# Patient Record
Sex: Male | Born: 1935
Health system: Southern US, Community
[De-identification: ages and names within clinical notes are randomized; demographics above are authoritative.]

## PROBLEM LIST (undated history)

## (undated) DIAGNOSIS — E7800 Pure hypercholesterolemia, unspecified: Secondary | ICD-10-CM

## (undated) DIAGNOSIS — I213 ST elevation (STEMI) myocardial infarction of unspecified site: Secondary | ICD-10-CM

## (undated) DIAGNOSIS — J449 Chronic obstructive pulmonary disease, unspecified: Secondary | ICD-10-CM

## (undated) DIAGNOSIS — I2510 Atherosclerotic heart disease of native coronary artery without angina pectoris: Secondary | ICD-10-CM

## (undated) DIAGNOSIS — T4145XA Adverse effect of unspecified anesthetic, initial encounter: Secondary | ICD-10-CM

## (undated) DIAGNOSIS — M1990 Unspecified osteoarthritis, unspecified site: Secondary | ICD-10-CM

## (undated) DIAGNOSIS — E039 Hypothyroidism, unspecified: Secondary | ICD-10-CM

## (undated) DIAGNOSIS — N138 Other obstructive and reflux uropathy: Secondary | ICD-10-CM

## (undated) DIAGNOSIS — Z9889 Other specified postprocedural states: Secondary | ICD-10-CM

## (undated) DIAGNOSIS — G4730 Sleep apnea, unspecified: Secondary | ICD-10-CM

## (undated) DIAGNOSIS — I10 Essential (primary) hypertension: Secondary | ICD-10-CM

## (undated) DIAGNOSIS — J309 Allergic rhinitis, unspecified: Secondary | ICD-10-CM

## (undated) DIAGNOSIS — E119 Type 2 diabetes mellitus without complications: Secondary | ICD-10-CM

## (undated) DIAGNOSIS — Z955 Presence of coronary angioplasty implant and graft: Secondary | ICD-10-CM

## (undated) DIAGNOSIS — Z87442 Personal history of urinary calculi: Secondary | ICD-10-CM

## (undated) DIAGNOSIS — N401 Enlarged prostate with lower urinary tract symptoms: Secondary | ICD-10-CM

## (undated) DIAGNOSIS — N200 Calculus of kidney: Secondary | ICD-10-CM

## (undated) DIAGNOSIS — N135 Crossing vessel and stricture of ureter without hydronephrosis: Secondary | ICD-10-CM

## (undated) DIAGNOSIS — S0291XA Unspecified fracture of skull, initial encounter for closed fracture: Secondary | ICD-10-CM

## (undated) DIAGNOSIS — I495 Sick sinus syndrome: Secondary | ICD-10-CM

## (undated) DIAGNOSIS — Z950 Presence of cardiac pacemaker: Secondary | ICD-10-CM

## (undated) DIAGNOSIS — I441 Atrioventricular block, second degree: Secondary | ICD-10-CM

## (undated) DIAGNOSIS — N3281 Overactive bladder: Secondary | ICD-10-CM

## (undated) DIAGNOSIS — H9193 Unspecified hearing loss, bilateral: Secondary | ICD-10-CM

## (undated) DIAGNOSIS — N183 Chronic kidney disease, stage 3 unspecified: Secondary | ICD-10-CM

## (undated) HISTORY — PX: CIRCUMCISION: SHX1350

## (undated) HISTORY — DX: Chronic obstructive pulmonary disease, unspecified: J44.9

## (undated) HISTORY — PX: LUMBAR DISC SURGERY: SHX700

## (undated) HISTORY — DX: Atrioventricular block, second degree: I44.1

## (undated) HISTORY — DX: Allergic rhinitis, unspecified: J30.9

## (undated) HISTORY — PX: VASECTOMY: SHX75

## (undated) HISTORY — PX: BACK SURGERY: SHX140

## (undated) HISTORY — PX: APPENDECTOMY: SHX54

## (undated) HISTORY — DX: Sick sinus syndrome: I49.5

## (undated) HISTORY — DX: Chronic kidney disease, stage 3 unspecified: N18.30

## (undated) HISTORY — DX: Hypothyroidism, unspecified: E03.9

## (undated) HISTORY — PX: COLONOSCOPY: SHX174

## (undated) HISTORY — DX: Pure hypercholesterolemia, unspecified: E78.00

## (undated) HISTORY — DX: Benign prostatic hyperplasia with lower urinary tract symptoms: N13.8

## (undated) HISTORY — DX: Unspecified hearing loss, bilateral: H91.93

## (undated) HISTORY — DX: Other specified postprocedural states: Z98.890

---
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
## 1979-07-10 HISTORY — PX: CIRCUMCISION: SHX1350

## 1999-09-22 ENCOUNTER — Other Ambulatory Visit: Admission: RE | Admit: 1999-09-22 | Discharge: 1999-09-22 | Payer: Self-pay | Admitting: *Deleted

## 1999-09-23 ENCOUNTER — Ambulatory Visit (HOSPITAL_COMMUNITY): Admission: RE | Admit: 1999-09-23 | Discharge: 1999-09-23 | Payer: Self-pay | Admitting: Family Medicine

## 2000-11-24 ENCOUNTER — Ambulatory Visit (HOSPITAL_COMMUNITY): Admission: RE | Admit: 2000-11-24 | Discharge: 2000-11-24 | Payer: Self-pay | Admitting: *Deleted

## 2004-04-20 ENCOUNTER — Ambulatory Visit (HOSPITAL_COMMUNITY): Admission: RE | Admit: 2004-04-20 | Discharge: 2004-04-20 | Payer: Self-pay | Admitting: *Deleted

## 2012-02-27 ENCOUNTER — Inpatient Hospital Stay (HOSPITAL_COMMUNITY)
Admission: EM | Admit: 2012-02-27 | Discharge: 2012-03-02 | DRG: 246 | Disposition: A | Discharge status: Home / Self Care | Payer: Medicare Other | Attending: Cardiovascular Disease | Admitting: Cardiovascular Disease

## 2012-02-27 ENCOUNTER — Encounter (HOSPITAL_COMMUNITY): Payer: Self-pay | Admitting: Emergency Medicine

## 2012-02-27 ENCOUNTER — Ambulatory Visit (HOSPITAL_COMMUNITY): Admit: 2012-02-27 | Payer: Self-pay | Admitting: Cardiovascular Disease

## 2012-02-27 ENCOUNTER — Encounter (HOSPITAL_COMMUNITY)
Admission: EM | Disposition: A | Discharge status: Home / Self Care | Payer: Self-pay | Source: Home / Self Care | Attending: Cardiovascular Disease

## 2012-02-27 DIAGNOSIS — I213 ST elevation (STEMI) myocardial infarction of unspecified site: Secondary | ICD-10-CM | POA: Diagnosis present

## 2012-02-27 DIAGNOSIS — I498 Other specified cardiac arrhythmias: Secondary | ICD-10-CM | POA: Diagnosis present

## 2012-02-27 DIAGNOSIS — Z955 Presence of coronary angioplasty implant and graft: Secondary | ICD-10-CM

## 2012-02-27 DIAGNOSIS — G4730 Sleep apnea, unspecified: Secondary | ICD-10-CM | POA: Diagnosis present

## 2012-02-27 DIAGNOSIS — R001 Bradycardia, unspecified: Secondary | ICD-10-CM | POA: Diagnosis not present

## 2012-02-27 DIAGNOSIS — I10 Essential (primary) hypertension: Secondary | ICD-10-CM | POA: Diagnosis present

## 2012-02-27 DIAGNOSIS — I2510 Atherosclerotic heart disease of native coronary artery without angina pectoris: Secondary | ICD-10-CM | POA: Diagnosis present

## 2012-02-27 DIAGNOSIS — I2582 Chronic total occlusion of coronary artery: Secondary | ICD-10-CM | POA: Diagnosis present

## 2012-02-27 DIAGNOSIS — E785 Hyperlipidemia, unspecified: Secondary | ICD-10-CM | POA: Diagnosis present

## 2012-02-27 DIAGNOSIS — Z87891 Personal history of nicotine dependence: Secondary | ICD-10-CM

## 2012-02-27 DIAGNOSIS — R570 Cardiogenic shock: Secondary | ICD-10-CM | POA: Diagnosis present

## 2012-02-27 DIAGNOSIS — G4733 Obstructive sleep apnea (adult) (pediatric): Secondary | ICD-10-CM | POA: Diagnosis present

## 2012-02-27 DIAGNOSIS — I2109 ST elevation (STEMI) myocardial infarction involving other coronary artery of anterior wall: Principal | ICD-10-CM | POA: Diagnosis present

## 2012-02-27 DIAGNOSIS — E876 Hypokalemia: Secondary | ICD-10-CM | POA: Diagnosis not present

## 2012-02-27 HISTORY — DX: Presence of coronary angioplasty implant and graft: Z95.5

## 2012-02-27 HISTORY — DX: ST elevation (STEMI) myocardial infarction of unspecified site: I21.3

## 2012-02-27 HISTORY — DX: Essential (primary) hypertension: I10

## 2012-02-27 HISTORY — PX: CARDIAC CATHETERIZATION: SHX172

## 2012-02-27 HISTORY — PX: LEFT HEART CATHETERIZATION WITH CORONARY ANGIOGRAM: SHX5451

## 2012-02-27 HISTORY — DX: Calculus of kidney: N20.0

## 2012-02-27 LAB — COMPREHENSIVE METABOLIC PANEL
Albumin: 3.5 g/dL (ref 3.5–5.2)
BUN: 14 mg/dL (ref 6–23)
Calcium: 8.7 mg/dL (ref 8.4–10.5)
Creatinine, Ser: 0.85 mg/dL (ref 0.50–1.35)
Total Protein: 6.6 g/dL (ref 6.0–8.3)

## 2012-02-27 LAB — CBC
HCT: 42.4 % (ref 39.0–52.0)
MCHC: 36.6 g/dL — ABNORMAL HIGH (ref 30.0–36.0)
MCV: 88.3 fL (ref 78.0–100.0)
RDW: 12.7 % (ref 11.5–15.5)

## 2012-02-27 LAB — PROTIME-INR
INR: 1.06 (ref 0.00–1.49)
Prothrombin Time: 14 seconds (ref 11.6–15.2)

## 2012-02-27 LAB — CARDIAC PANEL(CRET KIN+CKTOT+MB+TROPI)
CK, MB: 2.2 ng/mL (ref 0.3–4.0)
Total CK: 82 U/L (ref 7–232)

## 2012-02-27 LAB — MRSA PCR SCREENING: MRSA by PCR: NEGATIVE

## 2012-02-27 SURGERY — LEFT HEART CATHETERIZATION WITH CORONARY ANGIOGRAM
Anesthesia: LOCAL

## 2012-02-27 MED ORDER — BIVALIRUDIN 250 MG IV SOLR
INTRAVENOUS | Status: AC
  Filled 2012-02-27: qty 250

## 2012-02-27 MED ORDER — ALPRAZOLAM 0.25 MG PO TABS: 0.2500 mg | ORAL_TABLET | Freq: Three times a day (TID) | ORAL | Status: DC | PRN

## 2012-02-27 MED ORDER — METOPROLOL TARTRATE 25 MG PO TABS
25.0000 mg | ORAL_TABLET | Freq: Two times a day (BID) | ORAL | Status: DC
  Administered 2012-02-27 – 2012-02-29 (×3): 25 mg via ORAL
  Filled 2012-02-27 (×4): qty 1

## 2012-02-27 MED ORDER — ACETAMINOPHEN 325 MG PO TABS: 650.0000 mg | ORAL_TABLET | ORAL | Status: DC | PRN

## 2012-02-27 MED ORDER — NITROGLYCERIN 0.2 MG/ML ON CALL CATH LAB
INTRAVENOUS | Status: AC
  Filled 2012-02-27: qty 1

## 2012-02-27 MED ORDER — MORPHINE SULFATE 2 MG/ML IJ SOLN: 2.0000 mg | INTRAMUSCULAR | Status: DC | PRN

## 2012-02-27 MED ORDER — TICAGRELOR 90 MG PO TABS
90.0000 mg | ORAL_TABLET | Freq: Two times a day (BID) | ORAL | Status: DC
  Administered 2012-02-27 – 2012-03-02 (×8): 90 mg via ORAL
  Filled 2012-02-27 (×9): qty 1

## 2012-02-27 MED ORDER — TICAGRELOR 90 MG PO TABS
ORAL_TABLET | ORAL | Status: AC
  Filled 2012-02-27: qty 2

## 2012-02-27 MED ORDER — MIDAZOLAM HCL 2 MG/2ML IJ SOLN
INTRAMUSCULAR | Status: AC
  Filled 2012-02-27: qty 2

## 2012-02-27 MED ORDER — LIDOCAINE HCL (PF) 1 % IJ SOLN
INTRAMUSCULAR | Status: AC
  Filled 2012-02-27: qty 30

## 2012-02-27 MED ORDER — FENTANYL CITRATE 0.05 MG/ML IJ SOLN
INTRAMUSCULAR | Status: AC
Start: 2012-02-27 — End: 2012-02-27
  Filled 2012-02-27: qty 0.5

## 2012-02-27 MED ORDER — ATROPINE SULFATE 1 MG/ML IJ SOLN
INTRAMUSCULAR | Status: AC
  Filled 2012-02-27: qty 1

## 2012-02-27 MED ORDER — SODIUM CHLORIDE 0.9 % IV SOLN
INTRAVENOUS | Status: DC
  Administered 2012-02-27: 15:00:00 via INTRAVENOUS
  Administered 2012-02-27 – 2012-02-28 (×2): 150 mL/h via INTRAVENOUS
  Administered 2012-02-28: 20 mL/h via INTRAVENOUS

## 2012-02-27 MED ORDER — ZOLPIDEM TARTRATE 5 MG PO TABS: 5.0000 mg | ORAL_TABLET | Freq: Every evening | ORAL | Status: DC | PRN

## 2012-02-27 MED ORDER — TRAMADOL HCL 50 MG PO TABS
50.0000 mg | ORAL_TABLET | Freq: Four times a day (QID) | ORAL | Status: DC | PRN
  Filled 2012-02-27: qty 1

## 2012-02-27 MED ORDER — SODIUM CHLORIDE 0.9 % IV SOLN
0.2500 mg/kg/h | INTRAVENOUS | Status: AC
  Administered 2012-02-27: 0.25 mg/kg/h via INTRAVENOUS
  Filled 2012-02-27: qty 250

## 2012-02-27 MED ORDER — ACETAMINOPHEN 325 MG PO TABS
650.0000 mg | ORAL_TABLET | ORAL | Status: DC | PRN
  Administered 2012-03-01: 650 mg via ORAL
  Filled 2012-02-27: qty 2

## 2012-02-27 MED ORDER — ONDANSETRON HCL 4 MG/2ML IJ SOLN: 4.0000 mg | Freq: Four times a day (QID) | INTRAMUSCULAR | Status: DC | PRN

## 2012-02-27 MED ORDER — HEPARIN BOLUS VIA INFUSION
4000.0000 [IU] | Freq: Once | INTRAVENOUS | Status: AC
  Administered 2012-02-27: 4000 [IU] via INTRAVENOUS
  Filled 2012-02-27: qty 4000

## 2012-02-27 MED ORDER — ASPIRIN EC 81 MG PO TBEC
81.0000 mg | DELAYED_RELEASE_TABLET | Freq: Every day | ORAL | Status: DC
  Administered 2012-02-27 – 2012-03-02 (×5): 81 mg via ORAL
  Filled 2012-02-27 (×5): qty 1

## 2012-02-27 MED ORDER — FENTANYL CITRATE 0.05 MG/ML IJ SOLN
INTRAMUSCULAR | Status: AC
Start: 2012-02-27 — End: 2012-02-27
  Filled 2012-02-27: qty 2

## 2012-02-27 MED ORDER — HEPARIN (PORCINE) IN NACL 2-0.9 UNIT/ML-% IJ SOLN
INTRAMUSCULAR | Status: AC
  Filled 2012-02-27: qty 2000

## 2012-02-27 MED ORDER — HEPARIN SODIUM (PORCINE) 5000 UNIT/ML IJ SOLN
INTRAMUSCULAR | Status: AC
  Filled 2012-02-27: qty 1

## 2012-02-27 MED ORDER — NITROGLYCERIN IN D5W 200-5 MCG/ML-% IV SOLN
2.0000 ug/min | INTRAVENOUS | Status: DC
  Administered 2012-02-27: 2 ug/min via INTRAVENOUS
  Filled 2012-02-27: qty 250

## 2012-02-27 MED ORDER — ATROPINE SULFATE 1 MG/ML IJ SOLN
INTRAMUSCULAR | Status: DC | PRN
  Administered 2012-02-27: 1 mg via INTRAVENOUS

## 2012-02-27 MED ORDER — ATORVASTATIN CALCIUM 80 MG PO TABS
80.0000 mg | ORAL_TABLET | Freq: Every day | ORAL | Status: DC
  Administered 2012-02-27 – 2012-03-01 (×4): 80 mg via ORAL
  Filled 2012-02-27 (×5): qty 1

## 2012-02-27 MED ORDER — NITROGLYCERIN IN D5W 200-5 MCG/ML-% IV SOLN: 2.0000 ug/min | INTRAVENOUS | Status: DC

## 2012-02-27 NOTE — ED Provider Notes (Signed)
History     CSN: [id number]  Arrival date & time 02/27/12  1143   First MD Initiated Contact with Patient 02/27/12 1144      Chief Complaint  Patient presents with  . Chest Pain  . Code STEMI    (Consider location/radiation/quality/duration/timing/severity/associated sxs/prior treatment) Patient is a 76 y.o. male presenting with chest pain. The history is provided by the patient.  Chest Pain   He had onset about 2 hours ago of anterior chest pain without radiation. He has difficulty characterizing the pain but did note some heaviness in his arms. He he had nausea, vomiting and diaphoresis with this but no dyspnea. He had a similar episode of pain 5 days ago which resolved spontaneously. Today's pain was also associated with dizziness and lightheadedness. EMS was called and gave him aspirin and nitroglycerin with complete relief of pain. Pain was 8/10 at its worst. His cardiac risk factors are significant for hypertension. He is a former smoker.  Past Medical History  Diagnosis Date  . Hypertension   . Kidney stones     Past Surgical History  Procedure Date  . Back surgery 1980's  . Circumcision 1980's    History reviewed. No pertinent family history.  History  Substance Use Topics  . Smoking status: Former Games developer  . Smokeless tobacco: Former Neurosurgeon    Quit date: 02/27/1975  . Alcohol Use: No      Review of Systems  Cardiovascular: Positive for chest pain.  All other systems reviewed and are negative.    Allergies  Review of patient's allergies indicates no known allergies.  Home Medications  No current outpatient prescriptions on file.  BP 134/63  Pulse 71  Temp(Src) 98.7 F (37.1 C) (Oral)  Resp 16  Ht 5\' 11"  (1.803 m)  Wt 177 lb 14.6 oz (80.7 kg)  BMI 24.81 kg/m2  SpO2 95%  Physical Exam  Nursing note and vitals reviewed.  76 year old male who is resting comfortably and in no acute distress. Vital signs are normal. Oxygen saturation is 95% which  is normal. Head is normocephalic and atraumatic. PERRLA, EOMI. Oropharynx is clear. Neck is nontender and supple without adenopathy or JVD. Lungs are clear without rales, wheezes, or rhonchi. Heart has regular rate rhythm without murmur. Abdomen is soft, flat, nontender without masses or hepatosplenomegaly. Extremities have no cyanosis or edema, full range of motion is present. Skin is warm and dry without rash. Neurologic: Mental status is normal, cranial nerves are intact, there no focal motor or sensory deficits.  ED Course  Procedures (including critical care time)  Results for orders placed during the hospital encounter of 02/27/12  CBC      Component Value Range   WBC 10.4  4.0 - 10.5 (K/uL)   RBC 4.80  4.22 - 5.81 (MIL/uL)   Hemoglobin 15.5  13.0 - 17.0 (g/dL)   HCT 40.9  81.1 - 91.4 (%)   MCV 88.3  78.0 - 100.0 (fL)   MCH 32.3  26.0 - 34.0 (pg)   MCHC 36.6 (*) 30.0 - 36.0 (g/dL)   RDW 78.2  95.6 - 21.3 (%)   Platelets 278  150 - 400 (K/uL)  COMPREHENSIVE METABOLIC PANEL      Component Value Range   Sodium 136  135 - 145 (mEq/L)   Potassium 4.4  3.5 - 5.1 (mEq/L)   Chloride 103  96 - 112 (mEq/L)   CO2 20  19 - 32 (mEq/L)   Glucose, Bld 216 (*) 70 - 99 (  mg/dL)   BUN 14  6 - 23 (mg/dL)   Creatinine, Ser 4.78  0.50 - 1.35 (mg/dL)   Calcium 8.7  8.4 - 29.5 (mg/dL)   Total Protein 6.6  6.0 - 8.3 (g/dL)   Albumin 3.5  3.5 - 5.2 (g/dL)   AST 17  0 - 37 (U/L)   ALT 10  0 - 53 (U/L)   Alkaline Phosphatase 54  39 - 117 (U/L)   Total Bilirubin 0.4  0.3 - 1.2 (mg/dL)   GFR calc non Af Amer 84 (*) >90 (mL/min)   GFR calc Af Amer >90  >90 (mL/min)  PROTIME-INR      Component Value Range   Prothrombin Time 14.0  11.6 - 15.2 (seconds)   INR 1.06  0.00 - 1.49   APTT      Component Value Range   aPTT 27  24 - 37 (seconds)  CARDIAC PANEL(CRET KIN+CKTOT+MB+TROPI)      Component Value Range   Total CK 82  7 - 232 (U/L)   CK, MB 2.2  0.3 - 4.0 (ng/mL)   Troponin I <0.30  <0.30 (ng/mL)     Relative Index RELATIVE INDEX IS INVALID  0.0 - 2.5   MRSA PCR SCREENING      Component Value Range   MRSA by PCR NEGATIVE  NEGATIVE   CBC      Component Value Range   WBC 9.0  4.0 - 10.5 (K/uL)   RBC 4.31  4.22 - 5.81 (MIL/uL)   Hemoglobin 13.8  13.0 - 17.0 (g/dL)   HCT 62.1 (*) 30.8 - 52.0 (%)   MCV 89.6  78.0 - 100.0 (fL)   MCH 32.0  26.0 - 34.0 (pg)   MCHC 35.8  30.0 - 36.0 (g/dL)   RDW 65.7  84.6 - 96.2 (%)   Platelets 277  150 - 400 (K/uL)  BASIC METABOLIC PANEL      Component Value Range   Sodium 136  135 - 145 (mEq/L)   Potassium 3.8  3.5 - 5.1 (mEq/L)   Chloride 105  96 - 112 (mEq/L)   CO2 19  19 - 32 (mEq/L)   Glucose, Bld 145 (*) 70 - 99 (mg/dL)   BUN 14  6 - 23 (mg/dL)   Creatinine, Ser 9.52  0.50 - 1.35 (mg/dL)   Calcium 8.5  8.4 - 84.1 (mg/dL)   GFR calc non Af Amer 82 (*) >90 (mL/min)   GFR calc Af Amer >90  >90 (mL/min)  CARDIAC PANEL(CRET KIN+CKTOT+MB+TROPI)      Component Value Range   Total CK 1064 (*) 7 - 232 (U/L)   CK, MB 81.4 (*) 0.3 - 4.0 (ng/mL)   Troponin I >25.00 (*) <0.30 (ng/mL)   Relative Index 7.7 (*) 0.0 - 2.5   CARDIAC PANEL(CRET KIN+CKTOT+MB+TROPI)      Component Value Range   Total CK 723 (*) 7 - 232 (U/L)   CK, MB 40.9 (*) 0.3 - 4.0 (ng/mL)   Troponin I 22.87 (*) <0.30 (ng/mL)   Relative Index 5.7 (*) 0.0 - 2.5   MAGNESIUM      Component Value Range   Magnesium 2.0  1.5 - 2.5 (mg/dL)  LIPID PANEL      Component Value Range   Cholesterol 169  0 - 200 (mg/dL)   Triglycerides 324  <401 (mg/dL)   HDL 41  >02 (mg/dL)   Total CHOL/HDL Ratio 4.1     VLDL 21  0 - 40 (mg/dL)  LDL Cholesterol 107 (*) 0 - 99 (mg/dL)  TSH      Component Value Range   TSH 4.248  0.350 - 4.500 (uIU/mL)  HEPARIN LEVEL (UNFRACTIONATED)      Component Value Range   Heparin Unfractionated 0.33  0.30 - 0.70 (IU/mL)  CARDIAC PANEL(CRET KIN+CKTOT+MB+TROPI)      Component Value Range   Total CK 552 (*) 7 - 232 (U/L)   CK, MB 24.3 (*) 0.3 - 4.0 (ng/mL)    Troponin I 16.68 (*) <0.30 (ng/mL)   Relative Index 4.4 (*) 0.0 - 2.5   POCT ACTIVATED CLOTTING TIME      Component Value Range   Activated Clotting Time 523    POCT I-STAT, CHEM 8      Component Value Range   Sodium 126 (*) 135 - 145 (mEq/L)   Potassium 3.5  3.5 - 5.1 (mEq/L)   Chloride 92 (*) 96 - 112 (mEq/L)   BUN 14  6 - 23 (mg/dL)   Creatinine, Ser 4.09  0.50 - 1.35 (mg/dL)   Glucose, Bld 811 (*) 70 - 99 (mg/dL)   Calcium, Ion 9.14 (*) 1.12 - 1.32 (mmol/L)   TCO2 19  0 - 100 (mmol/L)   Hemoglobin 14.6  13.0 - 17.0 (g/dL)   HCT 78.2  95.6 - 21.3 (%)  BASIC METABOLIC PANEL      Component Value Range   Sodium 138  135 - 145 (mEq/L)   Potassium 4.2  3.5 - 5.1 (mEq/L)   Chloride 106  96 - 112 (mEq/L)   CO2 23  19 - 32 (mEq/L)   Glucose, Bld 149 (*) 70 - 99 (mg/dL)   BUN 18  6 - 23 (mg/dL)   Creatinine, Ser 0.86  0.50 - 1.35 (mg/dL)   Calcium 8.6  8.4 - 57.8 (mg/dL)   GFR calc non Af Amer 72 (*) >90 (mL/min)   GFR calc Af Amer 83 (*) >90 (mL/min)  CBC      Component Value Range   WBC 8.3  4.0 - 10.5 (K/uL)   RBC 4.21 (*) 4.22 - 5.81 (MIL/uL)   Hemoglobin 13.6  13.0 - 17.0 (g/dL)   HCT 46.9 (*) 62.9 - 52.0 (%)   MCV 90.7  78.0 - 100.0 (fL)   MCH 32.3  26.0 - 34.0 (pg)   MCHC 35.6  30.0 - 36.0 (g/dL)   RDW 52.8  41.3 - 24.4 (%)   Platelets 240  150 - 400 (K/uL)  HEMOGLOBIN A1C      Component Value Range   Hemoglobin A1C 7.6 (*) <5.7 (%)   Mean Plasma Glucose 171 (*) <117 (mg/dL)  HEPARIN LEVEL (UNFRACTIONATED)      Component Value Range   Heparin Unfractionated 0.39  0.30 - 0.70 (IU/mL)  BASIC METABOLIC PANEL      Component Value Range   Sodium 137  135 - 145 (mEq/L)   Potassium 3.8  3.5 - 5.1 (mEq/L)   Chloride 106  96 - 112 (mEq/L)   CO2 21  19 - 32 (mEq/L)   Glucose, Bld 209 (*) 70 - 99 (mg/dL)   BUN 15  6 - 23 (mg/dL)   Creatinine, Ser 0.10  0.50 - 1.35 (mg/dL)   Calcium 8.6  8.4 - 27.2 (mg/dL)   GFR calc non Af Amer 83 (*) >90 (mL/min)   GFR calc Af Amer  >90  >90 (mL/min)  CBC      Component Value Range   WBC 6.9  4.0 -  10.5 (K/uL)   RBC 4.26  4.22 - 5.81 (MIL/uL)   Hemoglobin 13.6  13.0 - 17.0 (g/dL)   HCT 78.2 (*) 95.6 - 52.0 (%)   MCV 90.6  78.0 - 100.0 (fL)   MCH 31.9  26.0 - 34.0 (pg)   MCHC 35.2  30.0 - 36.0 (g/dL)   RDW 21.3  08.6 - 57.8 (%)   Platelets 235  150 - 400 (K/uL)  GLUCOSE, CAPILLARY      Component Value Range   Glucose-Capillary 161 (*) 70 - 99 (mg/dL)   Comment 1 Notify RN     Dg Chest Port 1 View  02/28/2012  *RADIOLOGY REPORT*  Clinical Data: STEMI  PORTABLE CHEST - 1 VIEW  Comparison: None  Findings: The heart size is normal.  There is no pleural effusion or edema.  No airspace consolidation.  Lungs appear hyperinflated but clear.  The visualized osseous structures are unremarkable.  IMPRESSION:  1.  No active cardiopulmonary abnormalities.  Original Report Authenticated By: Rosealee Albee, M.D.    Date: 02/29/2012  Rate: 78  Rhythm: normal sinus rhythm  QRS Axis: normal  Intervals: normal  ST/T Wave abnormalities: ST elevation in leads V3, V4, V5, V6 with borderline ST elevation in leads 2 and aVF. Acute STEMI.  Conduction Disutrbances:none  Narrative Interpretation: Acute STEMI. No old ECG available for comparison.  Old EKG Reviewed: none available   1. STEMI (ST elevation myocardial infarction)    CRITICAL CARE Performed by: IONGE,XBMWU   Total critical care time: 35 minutes  Critical care time was exclusive of separately billable procedures and treating other patients.  Critical care was necessary to treat or prevent imminent or life-threatening deterioration.  Critical care was time spent personally by me on the following activities: development of treatment plan with patient and/or surrogate as well as nursing, discussions with consultants, evaluation of patient's response to treatment, examination of patient, obtaining history from patient or surrogate, ordering and performing treatments and  interventions, ordering and review of laboratory studies, ordering and review of radiographic studies, pulse oximetry and re-evaluation of patient's condition.   MDM  Acute STEMI. Dr. Tresa Endo has come into the emergency department and see the patient and will take him directly to the cardiac catheterization lab for PCI.        Dione Booze, MD 02/29/12 1556

## 2012-02-27 NOTE — Progress Notes (Signed)
Chaplain not able to respond to Code STEMI due to a more urgent page. Follow up as needed.

## 2012-02-27 NOTE — CV Procedure (Signed)
Emergency CATH/PCI:  Anterior STEMI  Lonnie Marshall, 76 y.o., male  DICTATION # [id number], [id number]  CP onset: 9:30 am Arrival to [place]: 11:45  CPR briefly in ER Arrival to cath lab: 11:52 1st balloon 12:17 DTB32 minutes  Total LAD occlusion, but with diffuse disease once opened treated with PTCA/ tandem DES 2.75x24 and 2.5x38 DES stents postdilated to 3.05 to 3.0 mm Concomitant LCX 50 prox, 70 - 80% OM stenosis RCA 50 - 90% mid stenosis  EF 55% with mild distal anterolateral and apical hypokinesis.  Will need stage PCI to RCA and possible LCX.  Lennette Bihari, MD, [facility] 02/27/2012 2:08 PM

## 2012-02-27 NOTE — Cardiovascular Report (Signed)
NAMEERCELL, [name] NO.:  [id number]  MEDICAL RECORD NO.:  [id number]  LOCATION:  2902                         FACILITY:  [facility]  PHYSICIAN:  Nicki Guadalajara, M.D.     DATE OF BIRTH:  11/22/1935  DATE OF PROCEDURE: DATE OF DISCHARGE:                           CARDIAC CATHETERIZATION   INDICATIONS:  [name] is a 76 year old gentleman who has a history of hypertension.  He denied any known history of prior coronary artery disease.  He has remote tobacco history, but he quit smoking in 1975.  Apparently at approximately 9:30 this morning, the patient developed significant substernal chest pressure.  EMS arrived at his house where ST-segment elevation myocardial infarction was noted with anterolateral ST-segment elevation.  A code STEMI was called.  The patient arrived to the emergency room.  As soon as I got in the emergency room, the patient became profoundly bradycardic and almost soft systolic.  CPR was administered.  He was given atropine plus amp of epinephrine.  His heart rate did increase.  He was then escorted acutely to the catheterization laboratory.  He still had residual 2-3/10 chest pain.  PROCEDURE:  Upon arrival to the catheterization laboratory, the patient was alert with residual chest discomfort.  Right femoral artery was punctured anteriorly and a 6-French sheath was inserted without difficulty.  Diagnostic catheterization was done utilizing 6-French Judkins 4 left and right coronary catheters.  With demonstration of total LAD occlusion after diagonal vessel, attempt was made to perform acute intervention.  A 6-French XB-LAD guide was used.  Angiomax bolus plus infusion was administered.  Brilinta 180 mg was given orally.  IC nitroglycerin was also administered down the left coronary system. Asahi medium wire was able to cross the total occlusion.  Once the wire was crossed, it appeared that there was significant ulcerated plaque at the  site of total occlusion.  There was also diffuse distal disease in the mid LAD segment with at least another area of 95% very eccentrically calcified stenosis.  Initial dilatation was done with a 2.0 x 12 mm Emerge balloon at the initial site and then also subsequent interventions were made at this more distal mid LAD site.  There also was significant disease of 60-70% proximal to the diagonal vessel which was proximal to the site of the initial occlusion.  It was felt that in order to optimally treat all areas, tandem long stenting was necessary. A 2.5 x 38 mm Xience Prime LL DES stent was then inserted and advanced beyond the septal perforating artery to cover the more distal 95% stenosis.  An additional 2.75 x 24 mm Promus Element stent was then inserted in tandem fashion proximal to the stent to cover the most proximal lesions proximal to the first diagonal vessel.  These were dilated x2.  A 3.5 x 20 mm noncompliant Quantum balloon was used for post-stent dilatation.  Initially, this was placed distally and initial dilatations were made up to approximately 2.8 mm.  However, there was still significant narrowing at the site that had a 95% stenosis which was eccentrically calcified.  Eventually, this was further dilated.  The entire stented region was then dilated approximately up to  3.0 mm corresponding to 12 atmospheres.  After multiple inflations, the most distal aspect of the stent was dilated to 2.94 mm.  All others were dilated to at least 3.  Since there did appear to be 3 focal residual areas of narrowing and particularly with the residual narrowing in the more distally placed stent to the eccentric stenosis, a 3.0 x 8 mm noncompliant Quantum balloon was then inserted.  All focal sites were then dilated to 14 atmospheres with this balloon corresponding to 3.05 mm.  There was concern about further aggressively dilating the more distal lesion due to concerns of potentially inducing  perforation. There was still residual narrowing of approximately 20% at this site, but the stent appeared well apposed to the wall.  There was brisk TIMI-3 flow.  There was no evidence for dissection.  The wire and arterial sheath were then removed.  A 6-French pigtail catheter was used for left ventriculography.  Due to conserve contrast, distal aortography was not performed at this time.  The patient will ultimately require stage intervention.  He left the catheterization laboratory with stable hemodynamics, chest pain free.  HEMODYNAMIC DATA:  Central aortic pressure was 110/55.  Left ventricular pressure 110/13.  Post A-wave 23.  ANGIOGRAPHIC DATA:  Left main coronary artery was angiographically normal.  It bifurcated into an LAD and left circumflex system.  The LAD had 20% ostial smooth narrowing.  The proximal LAD had 60-70% narrowing.  After the miniature first diagonal vessel and before the first septal perforating artery, a second diagonal vessel.  The diagonal vessel had 90% narrowing at its origin.  The LAD was then totally occluded after this diagonal vessel with TIMI 0 flow.  The circumflex vessel has a smooth 50% proximal narrowing.  There was 70- 80% narrowing in the circumflex marginal vessel after the AV groove takeoff.  The right coronary artery was a large-caliber dominant vessel that had 20-30% proximal luminal irregularity.  There was a 50% mid narrowing.  There was 90% stenosis just proximal to the acute margin. The RCA supplied the PDA and PLA vessel.  The following intervention to the LAD system. Once the wire crossed the 100% occlusion and initial balloon dilatation was done. It became apparent that at this site there was evidence for ulcerated plaque with dissection at the site of the initial infarction.  There was also apparent high-grade 95% eccentric calcified stenosis in the mid segment. This did not seem to dilate the mid segment and then more distally  mid lesion although was dilated with the balloon.  This did not seem to have any effect.  Ultimately, after placement of tandem 2.5 x 30 mm Xience Prime LL DES stent and more proximally placed Promus Element 2.75 x 24 mm stent with post-stent dilatation up to at least 3.0 mm, the entire region was sequentially reduced to 0%.  However, high pressure very focal dilatation was necessary up to 3.05 mm at 3 sites proximally at the initial site of acute occlusion and then at the more distal mid site which still had residual narrowing of approximately 20%.  Despite high pressure, very focal noncompliant balloon dilatation.  There was brisk TIMI 3 flow.  RAO ventriculography revealed ejection fraction of approximately 55%. There was hypocontractility in distal anterolateral apical wall.  IMPRESSION: 1. Acute ST-segment elevation anterior wall myocardial infarction     secondary to total left anterior descending occlusion. 2. Three-vessel coronary artery disease with 60-70% proximal left     anterior descending stenosis proximal  to the total occlusion, which     occurred after the second diagonal vessel with 90% stenosis of the     second diagonal vessel and once opened 95% mid stenosis; 50%     circumflex stenosis followed by a 80-90% circumflex marginal     stenosis and 90% stenosis in the dominant right coronary artery. 3. Mild acute left ventricular dysfunction with preserved systolic     ejection fraction of 55%, but with mid distal apical anterolateral     hypokinesis. 4. Successful percutaneous coronary intervention to the total left     anterior descending occlusion with 100% stenosis being reduced to     0%.  There was no evidence for significant left anterior descending     disease requiring tandem stenting with overlapping 2.75 x 24 and     2.5 x 38 mm DES stents postdilated to 3.0 mm with several focal     dilatations dilated up to 3.05 mm. 5. Bivalirudin/Brilinta/intracoronary  nitroglycerin. 6. Chest pain onset approximately 9:30 am.  The patient arrived to     [facility] at 1:147.  The patient arrived to the cath lab at     11:52.  First balloon inflation 12:17.          ______________________________ Nicki Guadalajara, M.D.     TK/MEDQ  D:  02/27/2012  T:  02/27/2012  Job:  [id number]  cc:   Molly Maduro L. Foy Guadalajara, M.D.

## 2012-02-27 NOTE — ED Notes (Signed)
Per EMS: pt c/o CP x 2 hours with dizziness and lightheadedness; pt denies SOB or nausea; pt hypotensive upon EMS arrival

## 2012-02-27 NOTE — Progress Notes (Signed)
ANTICOAGULATION CONSULT NOTE - Initial Consult  Pharmacy Consult for Bivalirudin Indication: Post-Cath x 4 hours  No Known Allergies  Patient Measurements: Height: 5\' 11"  (180.3 cm) Weight: 177 lb 14.6 oz (80.7 kg) IBW/kg (Calculated) : 75.3   Vital Signs: Temp: 99 F (37.2 C) (04/21 1438) Temp src: Oral (04/21 1438) BP: 121/66 mmHg (04/21 1500) Pulse Rate: 75  (04/21 1500)  Labs:  Basename 02/27/12 1251 02/27/12 1250  HGB -- 15.5  HCT -- 42.4  PLT -- 278  APTT -- 27  LABPROT -- 14.0  INR -- 1.06  HEPARINUNFRC -- --  CREATININE -- 0.85  CKTOTAL 82 --  CKMB 2.2 --  TROPONINI <0.30 --   Estimated Creatinine Clearance: 81.2 ml/min (by C-G formula based on Cr of 0.85).  Medical History: Past Medical History  Diagnosis Date  . Hypertension   . Kidney stones     Assessment: 76yoM admitted with CP/dizziness now being managed on reduced-dose Bivalirudin for 4 hours post cath. Per RN: dose was reduced around 2pm so to continue bivalirudin until 6pm.  Plan:  1) Continue Bivalirudin at current reduced dose rate (0.25 mg/kg/hr) until 6pm then DC.  Benjaman Pott, PharmD     Pager [phone] 02/27/2012   3:47 PM

## 2012-02-28 ENCOUNTER — Encounter (HOSPITAL_COMMUNITY): Payer: Self-pay | Admitting: Cardiology

## 2012-02-28 ENCOUNTER — Inpatient Hospital Stay (HOSPITAL_COMMUNITY): Payer: Medicare Other

## 2012-02-28 DIAGNOSIS — I10 Essential (primary) hypertension: Secondary | ICD-10-CM | POA: Diagnosis present

## 2012-02-28 DIAGNOSIS — I213 ST elevation (STEMI) myocardial infarction of unspecified site: Secondary | ICD-10-CM | POA: Diagnosis present

## 2012-02-28 DIAGNOSIS — Z955 Presence of coronary angioplasty implant and graft: Secondary | ICD-10-CM

## 2012-02-28 LAB — CARDIAC PANEL(CRET KIN+CKTOT+MB+TROPI)
Relative Index: 7.7 — ABNORMAL HIGH (ref 0.0–2.5)
Relative Index: 5.7 — ABNORMAL HIGH (ref 0.0–2.5)
Total CK: 723 U/L — ABNORMAL HIGH (ref 7–232)
Troponin I: 22.87 ng/mL (ref <0.30)

## 2012-02-28 LAB — POCT I-STAT, CHEM 8
BUN: 14 mg/dL (ref 6–23)
Chloride: 92 mEq/L — ABNORMAL LOW (ref 96–112)
Potassium: 3.5 mEq/L (ref 3.5–5.1)
Sodium: 126 mEq/L — ABNORMAL LOW (ref 135–145)

## 2012-02-28 LAB — CBC
HCT: 38.6 % — ABNORMAL LOW (ref 39.0–52.0)
MCV: 89.6 fL (ref 78.0–100.0)
RBC: 4.31 MIL/uL (ref 4.22–5.81)
RDW: 13.2 % (ref 11.5–15.5)
WBC: 9 10*3/uL (ref 4.0–10.5)

## 2012-02-28 LAB — BASIC METABOLIC PANEL
BUN: 14 mg/dL (ref 6–23)
CO2: 19 mEq/L (ref 19–32)
Chloride: 105 mEq/L (ref 96–112)
Creatinine, Ser: 0.89 mg/dL (ref 0.50–1.35)

## 2012-02-28 LAB — TSH: TSH: 4.248 u[IU]/mL (ref 0.350–4.500)

## 2012-02-28 LAB — LIPID PANEL: LDL Cholesterol: 107 mg/dL — ABNORMAL HIGH (ref 0–99)

## 2012-02-28 MED ORDER — OMEGA-3-ACID ETHYL ESTERS 1 G PO CAPS
1.0000 g | ORAL_CAPSULE | Freq: Every day | ORAL | Status: DC
  Administered 2012-02-28 – 2012-03-02 (×4): 1 g via ORAL
  Filled 2012-02-28 (×4): qty 1

## 2012-02-28 MED ORDER — ISOSORBIDE MONONITRATE 15 MG HALF TABLET
15.0000 mg | ORAL_TABLET | Freq: Every day | ORAL | Status: DC
  Administered 2012-02-28 – 2012-03-02 (×4): 15 mg via ORAL
  Filled 2012-02-28 (×5): qty 1

## 2012-02-28 MED ORDER — OXYBUTYNIN CHLORIDE 5 MG PO TABS
10.0000 mg | ORAL_TABLET | Freq: Three times a day (TID) | ORAL | Status: DC
  Administered 2012-02-28 – 2012-03-02 (×8): 10 mg via ORAL
  Filled 2012-02-28 (×12): qty 2

## 2012-02-28 MED ORDER — SENNOSIDES-DOCUSATE SODIUM 8.6-50 MG PO TABS
1.0000 | ORAL_TABLET | Freq: Every day | ORAL | Status: DC
  Administered 2012-02-28 – 2012-03-01 (×3): 1 via ORAL
  Filled 2012-02-28 (×3): qty 1

## 2012-02-28 MED ORDER — HEPARIN (PORCINE) IN NACL 100-0.45 UNIT/ML-% IJ SOLN
1200.0000 [IU]/h | INTRAMUSCULAR | Status: DC
  Administered 2012-02-28 – 2012-03-01 (×3): 1200 [IU]/h via INTRAVENOUS
  Filled 2012-02-28 (×5): qty 250

## 2012-02-28 MED FILL — Dextrose Inj 5%: INTRAVENOUS | Qty: 50 | Status: AC

## 2012-02-28 NOTE — Progress Notes (Signed)
ANTICOAGULATION CONSULT NOTE - Follow Up Consult  Pharmacy Consult for Heparin Indication: ACS - Planned PCI  No Known Allergies  Vital Signs: Temp: 98.5 F (36.9 C) (04/22 1700) Temp src: Oral (04/22 1600) BP: 109/61 mmHg (04/22 1700) Pulse Rate: 58  (04/22 1700)  Labs:  Basename 02/28/12 1731 02/28/12 1645 02/28/12 0857 02/28/12 0510 02/27/12 1251 02/27/12 1250 02/27/12 1241  HGB -- -- -- 13.8 -- 15.5 --  HCT -- -- -- 38.6* -- 42.4 43.0  PLT -- -- -- 277 -- 278 --  APTT -- -- -- -- -- 27 --  LABPROT -- -- -- -- -- 14.0 --  INR -- -- -- -- -- 1.06 --  HEPARINUNFRC 0.33 -- -- -- -- -- --  CREATININE -- -- -- 0.89 -- 0.85 0.60  CKTOTAL -- 723* 1064* -- 82 -- --  CKMB -- 40.9* 81.4* -- 2.2 -- --  TROPONINI -- 22.87* >25.00* -- <0.30 -- --   Estimated Creatinine Clearance: 77.6 ml/min (by C-G formula based on Cr of 0.89).  Medications:  Heparin @ 1200 units/hr  Assessment: 76yom resumed on heparin s/p cath procedure with DES to LAD awaiting staged PCI to RCA and possible LCX (probably Wednesday). Initial heparin level is therapeutic. No bleeding per chart notes.  Goal of Therapy:  Heparin level 0.3-0.7 units/ml   Plan:  1) Continue heparin at 1200 units/hr 2) Follow up heparin level in AM   Fredrik Rigger 02/28/2012,6:59 PM

## 2012-02-28 NOTE — Progress Notes (Addendum)
   CARE MANAGEMENT NOTE 02/28/2012  Patient:  Lonnie Marshall,Lonnie Marshall   Account Number:  [id number]  Date Initiated:  02/28/2012  Documentation initiated by:  GRAVES-BIGELOW,Kassadie Pancake  Subjective/Objective Assessment:   Pt admitted with stemi-emergent cath. Plan for staged  PCI to RCA and possible LCX- Tuesday vs Wednesday. Plan for d/c when stable on brilinta. Pt is from home with wife and has additional family support. CM to assist with medications.     Action/Plan:   CM did make a copy of insurance card and faxed to admitting. CM also provided pt with a 30 day free brilinta card. [facility] in [place] and will call to make sure meds is available. MD please write Rx for 30 day free no refills.   Anticipated DC Date:  03/02/2012   Anticipated DC Plan:  HOME/SELF CARE      DC Planning Services  CM consult      Choice offered to / List presented to:             Status of service:  Completed, signed off Medicare Important Message given?   (If response is "NO", the following Medicare IM given date fields will be blank) Date Medicare IM given:   Date Additional Medicare IM given:    Discharge Disposition:  HOME/SELF CARE  Per UR Regulation:    If discussed at Long Length of Stay Meetings, dates discussed:    Comments:  02-28-12 1450 Tomi Bamberger, RN,BSN [phone] CM did call pt's insurance and he does not have part D Rx drug coverage. Pt usually gets medicatons from the [employer]. CM will place patient assistance forms on the shadow chart. Please fill out if brilinta is the medicaiton you would like for pt to be d/c home on. Pt has 30 day free brilinta card. CM did speak to Wilburt Finlay PA to make him aware of situation. Plavix may be a substitution due to cost issues with pt. Thanks.   02-28-12 [address], [place] [phone] CM did call [facility] to seei f medication is available and pharmacy will order medicaiton. Will be available 02-29-12. CM will do a  benefits check for medicaiton. Will make pt aware when complete.

## 2012-02-28 NOTE — Progress Notes (Signed)
STEMI of 02/27/12 Acute ant. MI with Total LAD occlusion, but with diffuse disease once opened treated with PTCA/ tandem DES 2.75x24 and 2.5x38 DES stents postdilated to 3.05 to 3.0 mm  Concomitant LCX 50 prox, 70 - 80% OM stenosis  RCA 50 - 90% mid stenosis  EF 55% with mild distal anterolateral and apical hypokinesis.  Will need stage PCI to RCA and possible LCX   Subjective: No complaints  Objective: Vital signs in last 24 hours: Temp:  [97.8 F (36.6 C)-99 F (37.2 C)] 98.6 F (37 C) (04/22 0800) Pulse Rate:  [50-95] 59  (04/22 0700) Resp:  [10-18] 15  (04/22 0700) BP: (93-135)/(49-80) 118/57 mmHg (04/22 0700) SpO2:  [93 %-99 %] 95 % (04/22 0700) Arterial Line BP: (128-157)/(62-74) 145/67 mmHg (04/21 2000) Weight:  [80.7 kg (177 lb 14.6 oz)-82.7 kg (182 lb 5.1 oz)] 80.7 kg (177 lb 14.6 oz) (04/21 1438) Weight change:  Last BM Date: 02/26/12 Intake/Output from previous day: +2003 04/21 0701 - 04/22 0700 In: 2153.6 [P.O.:100; I.V.:2053.6] Out: -  Intake/Output this shift:    PE: General:A&O X 3, MAE, Follows commands Neck:supple, no JVD Heart:S1S2 RRR, no M/R/G Lungs:clear to ausculation, non-labored Abd:+ BS, soft, non tender Ext:tr. Ankle edema, 2 + pedal on the RT, 1/2+on left, Rt. Groin cath site. Without hematoma.   Lab Results:  Basename 02/28/12 0510 02/27/12 1250  WBC 9.0 10.4  HGB 13.8 15.5  HCT 38.6* 42.4  PLT 277 278   BMET  Basename 02/28/12 0510 02/27/12 1250  NA 136 136  K 3.8 4.4  CL 105 103  CO2 19 20  GLUCOSE 145* 216*  BUN 14 14  CREATININE 0.89 0.85  CALCIUM 8.5 8.7    Basename 02/27/12 1251  TROPONINI <0.30  No follow-up troponin ordered.   Hepatic Function Panel  Basename 02/27/12 1250  PROT 6.6  ALBUMIN 3.5  AST 17  ALT 10  ALKPHOS 54  BILITOT 0.4  BILIDIR --  IBILI --   No results found for this basename: CHOL in the last 72 hours No results found for this basename: PROTIME in the last 72 hours    EKG:  SR with  evolutionary changes of acute ant. MI  Studies/Results:Cardiac cath emergent and PCI   Total LAD occlusion, but with diffuse disease once opened treated with PTCA/ tandem DES 2.75x24 and 2.5x38 DES stents postdilated to 3.05 to 3.0 mm  Concomitant LCX 50 prox, 70 - 80% OM stenosis  RCA 50 - 90% mid stenosis  EF 55% with mild distal anterolateral and apical hypokinesis.  Will need stage PCI to RCA and possible LCX  Medications: I have reviewed the patient's current medications.    Marland Kitchen aspirin EC  81 mg Oral Daily  . atorvastatin  80 mg Oral q1800  . bivalirudin      . bivalirudin (ANGIOMAX) infusion 5 mg/mL (Cath Lab,ACS,PCI indication)  0.25 mg/kg/hr Intravenous To Cath  . fentaNYL      . fentaNYL      . heparin      . heparin      . heparin  4,000 Units Intravenous Once  . lidocaine      . metoprolol tartrate  25 mg Oral BID  . midazolam      . nitroGLYCERIN      . Ticagrelor      . Ticagrelor  90 mg Oral BID   Assessment/Plan: Patient Active Problem List  Diagnoses  . STEMI (ST elevation myocardial infarction), Ant. Wall   .  S/P coronary artery stent placement, emergently to 100% occl. LAD with DES X 2.    . HTN (hypertension)   PLAN:  Borderline BP, will hold on ACE for now. Check Lipids, Mg+, TSH and cardiac enzymes.   IV NTG at 2 mcg. ? Change NTG to IMDUR? Glucose elevated will check HgBA1C  Need for Staged PCI.  ? Tomorrow? Pt. Has difficulty voiding and would like to keep foley cath until after next PCI.   LOS: 1 day   INGOLD,LAURA R 02/28/2012, 8:33 AM  ATTENDING ATTESTATION:  I have seen and examined the patient along with Nada Boozer, NP.  I have reviewed the chart, notes and new data.  I agree with Laura's note.  Brief Description: 76 y/o man who presented 4/21 with Anterior STEMI --> 100% midLAD --> overlapping DES stents placed 2.5 mm x 38 mm Xience (distal) with Promus 2.75 mm x 24 mm proximal; also note to have significant RCA & LCx lesions.  Anterior  WMA on LVGram.   Key new complaints: No further CP; has difficulty voiding -- & constipation from "bladder meds"  Key examination changes: agree with exam above  Key new findings / data: Troponins not checked - will need to check, Glucoses have been elevated  PLAN:  Would continue IV Heparin as 2 additional lesions are present with recent ACS & extensive LAD stents.   Continue DAPT (Brilinta & ASA); check cardiac biomarkers today.  On Statin & BB (cannot titrate due to bradycardia); will convert NTG gtt to Imdur; BPs remain in low ~110s, will hold off on ACE-I or ARB.  Agree with checking HgbA1c for elevated glucose ? DM  EF preserved with LV Gram - can likely hold off on Echo until OP f/u post staged PCI.  CM consultation for long term Brilinta.   Can transfer to Tele today and gently ambulate. Will need to discuss with Dr. Tresa Endo re timing of staged PCI - would consider for Tomorrow at the earliest, but Wednesday is preferred to allow time for recovery & contrast washout.  Marykay Lex, M.D., M.S. [facility] [address] [place], [place]  [address]  [phone]  02/28/2012 10:22 AM

## 2012-02-28 NOTE — Progress Notes (Signed)
CARDIAC REHAB PHASE I   PRE:  Rate/Rhythm: 55SB  BP:  Supine:   Sitting: 102/59  Standing:    SaO2: 96%2L.  96%RA  MODE:  Ambulation: 250 ft   POST:  Rate/Rhythem: 64SR  BP:  Supine:   Sitting: 126/61  Standing:    SaO2: 96%RA 1130-1210 Pt walked 250 ft on RA with asst x 1. Slow steady pace. Denied chest pain. Tolerated well. Back to recliner. Gave wife MI and stent books. Left off oxygen since sats good.  Duanne Limerick

## 2012-02-28 NOTE — Progress Notes (Signed)
UR Completed. Simmons, Alesi Zachery F [phone]  

## 2012-02-28 NOTE — Progress Notes (Signed)
   CARE MANAGEMENT NOTE 02/28/2012  Patient:  Lonnie Marshall,Lonnie Marshall   Account Number:  [id number]  Date Initiated:  02/28/2012  Documentation initiated by:  GRAVES-BIGELOW,Erminio Nygard  Subjective/Objective Assessment:   Pt admitted with stemi-emergent cath. Plan for staged  PCI to RCA and possible LCX- Tuesday vs Wednesday. Plan for d/c when stable on brilinta. Pt is from home with wife and has additional family support. CM to assist with medications.     Action/Plan:   CM did make a copy of insurance card and faxed to admitting. CM also provided pt with a 30 day free brilinta card. [facility] [place] and will call to make sure meds is available. MD please write Rx for 30 day free no refills.   Anticipated DC Date:  03/02/2012   Anticipated DC Plan:  HOME/SELF CARE      DC Planning Services  CM consult      Choice offered to / List presented to:             Status of service:  Completed, signed off Medicare Important Message given?   (If response is "NO", the following Medicare IM given date fields will be blank) Date Medicare IM given:   Date Additional Medicare IM given:    Discharge Disposition:  HOME/SELF CARE  Per UR Regulation:    If discussed at Long Length of Stay Meetings, dates discussed:    Comments:  02-28-12 1123 Tomi Bamberger, RN,BSN [phone] CM did call [facility] to seei f medication is available and pharmacy will order medicaiton. Will be available 02-29-12. CM will do a benefits check for medicaiton. Will make pt aware when complete.

## 2012-02-28 NOTE — Progress Notes (Signed)
Right Femoral Arterial Line D/C as ordered. Manual pressure head for 30 minutes. Area began and ended as level 0. Educated pt. To keep head on pillow, keep right leg straight, to report any cold/warml/pain to right groin. Right pedal pulse began and ended with 2+ pulses.Dressing dry and intact, with area soft. Pressure dsg. Applied. RN assigned to pt. Has been instructed to check site and pulses every 15 minutes for the first hour.  Karen Chafe RN

## 2012-02-28 NOTE — Progress Notes (Signed)
Report called to receiving RN [phone]. Pt pain free, will transfer via WC.

## 2012-02-28 NOTE — H&P (Signed)
Lonnie Marshall is an 76 y.o. male.   Chief Complaint: Chest Pain HPI:  The patient is a 76 year old Caucasian male with a history of hypertension, hyperlipidemia,  remote back surgery, tobacco abuse but has quit smoking but uses oral tobacco.  Patient states that last Tuesday he developed an episode of chest pain just the proximal to the manubrium. He took 2 TUMS and the pain resolved. No further episodes of chest discomfort until Sunday morning when he developed chest pain in the same area as well as a feeling of arm heaviness. He associated diaphoresis and shortness of breath as well as a blurry and double vision.  [facility] on to [facility] acute pain became more severe EMS was subsequently called patient was taken directly to [facility] where EKG revealed the ST elevation in V3 through 6 predominantly in the 4 and 5.    Past Medical History  Diagnosis Date  . Hypertension   . Kidney stones   . STEMI (ST elevation myocardial infarction), Ant. Wall  02/28/2012  . S/P coronary artery stent placement, emergently to 100% occl. LAD with DES X 2.   02/28/2012  . HTN (hypertension) 02/28/2012    Past Surgical History  Procedure Date  . Back surgery 1980's  . Circumcision 1980's    History reviewed. No pertinent family history. Social History:  reports that he has quit smoking. He quit smokeless tobacco use about 37 years ago. He reports that he does not drink alcohol or use illicit drugs.  Allergies: No Known Allergies  Medications Prior to Admission  Medication Sig Dispense Refill  . amLODipine (NORVASC) 10 MG tablet Take 10 mg by mouth daily.      . cholecalciferol (VITAMIN D) 1000 UNITS tablet Take 1,000 Units by mouth daily.      . Omega-3 Fatty Acids (FISH OIL) 1200 MG CAPS Take 1,200 mg by mouth 3 (three) times daily.      Marland Kitchen oxybutynin (DITROPAN) 5 MG tablet Take 10 mg by mouth 3 (three) times daily.      Marland Kitchen senna-docusate (SENOKOT-S) 8.6-50 MG per tablet Take 1 tablet by  mouth daily.        Results for orders placed during the hospital encounter of 02/27/12 (from the past 48 hour(s))  CBC     Status: Abnormal   Collection Time   02/27/12 12:50 PM      Component Value Range Comment   WBC 10.4  4.0 - 10.5 (K/uL)    RBC 4.80  4.22 - 5.81 (MIL/uL)    Hemoglobin 15.5  13.0 - 17.0 (g/dL)    HCT 65.7  84.6 - 96.2 (%)    MCV 88.3  78.0 - 100.0 (fL)    MCH 32.3  26.0 - 34.0 (pg)    MCHC 36.6 (*) 30.0 - 36.0 (g/dL)    RDW 95.2  84.1 - 32.4 (%)    Platelets 278  150 - 400 (K/uL)   COMPREHENSIVE METABOLIC PANEL     Status: Abnormal   Collection Time   02/27/12 12:50 PM      Component Value Range Comment   Sodium 136  135 - 145 (mEq/L)    Potassium 4.4  3.5 - 5.1 (mEq/L)    Chloride 103  96 - 112 (mEq/L)    CO2 20  19 - 32 (mEq/L)    Glucose, Bld 216 (*) 70 - 99 (mg/dL)    BUN 14  6 - 23 (mg/dL)    Creatinine, Ser 4.01  0.50 - 1.35 (mg/dL)    Calcium 8.7  8.4 - 10.5 (mg/dL)    Total Protein 6.6  6.0 - 8.3 (g/dL)    Albumin 3.5  3.5 - 5.2 (g/dL)    AST 17  0 - 37 (U/L) HEMOLYSIS AT THIS LEVEL MAY AFFECT RESULT   ALT 10  0 - 53 (U/L)    Alkaline Phosphatase 54  39 - 117 (U/L)    Total Bilirubin 0.4  0.3 - 1.2 (mg/dL)    GFR calc non Af Amer 84 (*) >90 (mL/min)    GFR calc Af Amer >90  >90 (mL/min)   PROTIME-INR     Status: Normal   Collection Time   02/27/12 12:50 PM      Component Value Range Comment   Prothrombin Time 14.0  11.6 - 15.2 (seconds)    INR 1.06  0.00 - 1.49    APTT     Status: Normal   Collection Time   02/27/12 12:50 PM      Component Value Range Comment   aPTT 27  24 - 37 (seconds)   CARDIAC PANEL(CRET KIN+CKTOT+MB+TROPI)     Status: Normal   Collection Time   02/27/12 12:51 PM      Component Value Range Comment   Total CK 82  7 - 232 (U/L)    CK, MB 2.2  0.3 - 4.0 (ng/mL)    Troponin I <0.30  <0.30 (ng/mL)    Relative Index RELATIVE INDEX IS INVALID  0.0 - 2.5    MRSA PCR SCREENING     Status: Normal   Collection Time   02/27/12   2:44 PM      Component Value Range Comment   MRSA by PCR NEGATIVE  NEGATIVE    CBC     Status: Abnormal   Collection Time   02/28/12  5:10 AM      Component Value Range Comment   WBC 9.0  4.0 - 10.5 (K/uL)    RBC 4.31  4.22 - 5.81 (MIL/uL)    Hemoglobin 13.8  13.0 - 17.0 (g/dL)    HCT 95.6 (*) 21.3 - 52.0 (%)    MCV 89.6  78.0 - 100.0 (fL)    MCH 32.0  26.0 - 34.0 (pg)    MCHC 35.8  30.0 - 36.0 (g/dL)    RDW 08.6  57.8 - 46.9 (%)    Platelets 277  150 - 400 (K/uL)   BASIC METABOLIC PANEL     Status: Abnormal   Collection Time   02/28/12  5:10 AM      Component Value Range Comment   Sodium 136  135 - 145 (mEq/L)    Potassium 3.8  3.5 - 5.1 (mEq/L)    Chloride 105  96 - 112 (mEq/L)    CO2 19  19 - 32 (mEq/L)    Glucose, Bld 145 (*) 70 - 99 (mg/dL)    BUN 14  6 - 23 (mg/dL)    Creatinine, Ser 6.29  0.50 - 1.35 (mg/dL)    Calcium 8.5  8.4 - 10.5 (mg/dL)    GFR calc non Af Amer 82 (*) >90 (mL/min)    GFR calc Af Amer >90  >90 (mL/min)   CARDIAC PANEL(CRET KIN+CKTOT+MB+TROPI)     Status: Abnormal   Collection Time   02/28/12  8:57 AM      Component Value Range Comment   Total CK 1064 (*) 7 - 232 (U/L)    CK, MB 81.4 (*)  0.3 - 4.0 (ng/mL)    Troponin I >25.00 (*) <0.30 (ng/mL)    Relative Index 7.7 (*) 0.0 - 2.5    MAGNESIUM     Status: Normal   Collection Time   02/28/12  8:57 AM      Component Value Range Comment   Magnesium 2.0  1.5 - 2.5 (mg/dL)   LIPID PANEL     Status: Abnormal   Collection Time   02/28/12  8:57 AM      Component Value Range Comment   Cholesterol 169  0 - 200 (mg/dL)    Triglycerides 161  <150 (mg/dL)    HDL 41  >09 (mg/dL)    Total CHOL/HDL Ratio 4.1      VLDL 21  0 - 40 (mg/dL)    LDL Cholesterol 604 (*) 0 - 99 (mg/dL)   TSH     Status: Normal   Collection Time   02/28/12  8:57 AM      Component Value Range Comment   TSH 4.248  0.350 - 4.500 (uIU/mL)    Dg Chest Port 1 View  02/28/2012  *RADIOLOGY REPORT*  Clinical Data: STEMI  PORTABLE CHEST -  1 VIEW  Comparison: None  Findings: The heart size is normal.  There is no pleural effusion or edema.  No airspace consolidation.  Lungs appear hyperinflated but clear.  The visualized osseous structures are unremarkable.  IMPRESSION:  1.  No active cardiopulmonary abnormalities.  Original Report Authenticated By: Rosealee Albee, M.D.    Review of Systems  Constitutional: Positive for diaphoresis. Negative for fever.  HENT: Negative for congestion and neck pain.   Eyes: Positive for blurred vision and double vision.  Respiratory: Positive for shortness of breath. Negative for cough.   Cardiovascular: Positive for chest pain. Negative for palpitations, orthopnea and leg swelling.  Gastrointestinal: Negative for nausea, vomiting, abdominal pain, diarrhea and constipation.  Genitourinary: Negative for dysuria.  Musculoskeletal: Positive for myalgias.  Neurological: Negative for dizziness and headaches.    Blood pressure 101/53, pulse 55, temperature 98.3 F (36.8 C), temperature source Oral, resp. rate 17, height 5\' 11"  (1.803 m), weight 80.7 kg (177 lb 14.6 oz), SpO2 97.00%. Physical Exam  Constitutional: He is oriented to person, place, and time. He appears well-developed and well-nourished. He appears distressed.  HENT:  Head: Normocephalic and atraumatic.  Eyes: EOM are normal. Pupils are equal, round, and reactive to light. No scleral icterus.  Neck: Normal range of motion. Neck supple. No JVD present.  Cardiovascular: Normal rate and regular rhythm.   No murmur heard. Respiratory: Effort normal and breath sounds normal. No respiratory distress. He has no wheezes. He has no rales.  GI: Soft. Bowel sounds are normal. He exhibits no distension.  Musculoskeletal: He exhibits no edema.       No lower extremity edema  Lymphadenopathy:    He has no cervical adenopathy.  Neurological: He is alert and oriented to person, place, and time. He exhibits normal muscle tone.  Skin: Skin is warm  and dry.  Psychiatric: He has a normal mood and affect.     Assessment/Plan 1. STEMI  Plan:  Patient was taken emergently to the cath lab.  Dwana Melena 02/28/2012, 2:58 PM  The patient was seen & evaluated by my partner, Dr. Tresa Endo, who took the patient to the cardiac cath lab & performed PCI on the LAD. Please see his cath note for details. I saw the patient the following AM. I agree with Mr.  Hager's initial H&P - as a result of my conversation with Dr. Tresa Endo.   Marykay Lex, M.D., M.S. [facility] [address] [place], [place]  [address]  [phone] Pager # [phone]  02/29/2012 6:41 PM

## 2012-02-28 NOTE — Progress Notes (Signed)
ANTICOAGULATION CONSULT NOTE - Follow Up  Pharmacy Consult for Heparin Indication:  ACS - Planned PCI  No Known Allergies  Patient Measurements: Height: 5\' 11"  (180.3 cm) Weight: 177 lb 14.6 oz (80.7 kg) IBW/kg (Calculated) : 75.3   Vital Signs: Temp: 98.6 F (37 C) (04/22 0800) Temp src: Oral (04/22 0800) BP: 116/58 mmHg (04/22 0800) Pulse Rate: 57  (04/22 0800)  Labs:  Basename 02/28/12 0510 02/27/12 1251 02/27/12 1250  HGB 13.8 -- 15.5  HCT 38.6* -- 42.4  PLT 277 -- 278  APTT -- -- 27  LABPROT -- -- 14.0  INR -- -- 1.06  HEPARINUNFRC -- -- --  CREATININE 0.89 -- 0.85  CKTOTAL -- 82 --  CKMB -- 2.2 --  TROPONINI -- <0.30 --   Estimated Creatinine Clearance: 77.6 ml/min (by C-G formula based on Cr of 0.89).  Medical History: Past Medical History  Diagnosis Date  . Hypertension   . Kidney stones   . STEMI (ST elevation myocardial infarction), Ant. Wall  02/28/2012  . S/P coronary artery stent placement, emergently to 100% occl. LAD with DES X 2.   02/28/2012  . HTN (hypertension) 02/28/2012    Assessment: 76yoM admitted with CP/dizziness, s/p cath procedure with DES to LAD.  He has diffuse disease with plans for staged PCI and intervention.  He received Bivalirudin without noted complications and is now to be started on IV heparin until procedures complete.  His H/H is stable this morning and no signs of bleeding.  Plan:  1)   Begin IV heparin without a bolus at 1200 units/hr. 2).  F/U 8 hour heparin level and adjust as needed. 3).  Monitor for s/s of bleeding complications.   Nadara Mustard, PharmD., MS Clinical Pharmacist Pager:  [phone] Thank you for allowing pharmacy to be part of this patients care team. 02/28/2012   9:23 AM

## 2012-02-29 DIAGNOSIS — R001 Bradycardia, unspecified: Secondary | ICD-10-CM | POA: Diagnosis not present

## 2012-02-29 DIAGNOSIS — I2510 Atherosclerotic heart disease of native coronary artery without angina pectoris: Secondary | ICD-10-CM | POA: Diagnosis present

## 2012-02-29 DIAGNOSIS — E785 Hyperlipidemia, unspecified: Secondary | ICD-10-CM | POA: Diagnosis present

## 2012-02-29 LAB — BASIC METABOLIC PANEL
BUN: 15 mg/dL (ref 6–23)
CO2: 21 mEq/L (ref 19–32)
CO2: 23 mEq/L (ref 19–32)
Calcium: 8.6 mg/dL (ref 8.4–10.5)
Chloride: 106 mEq/L (ref 96–112)
Chloride: 106 mEq/L (ref 96–112)
Creatinine, Ser: 0.86 mg/dL (ref 0.50–1.35)
GFR calc Af Amer: >90 mL/min (ref >90)
GFR calc non Af Amer: 72 mL/min — ABNORMAL LOW (ref >90)
GFR calc non Af Amer: 83 mL/min — ABNORMAL LOW (ref >90)
Glucose, Bld: 149 mg/dL — ABNORMAL HIGH (ref 70–99)
Glucose, Bld: 209 mg/dL — ABNORMAL HIGH (ref 70–99)
Potassium: 3.8 mEq/L (ref 3.5–5.1)
Potassium: 4.2 mEq/L (ref 3.5–5.1)
Sodium: 137 mEq/L (ref 135–145)
Sodium: 138 mEq/L (ref 135–145)

## 2012-02-29 LAB — CBC
HCT: 38.6 % — ABNORMAL LOW (ref 39.0–52.0)
Hemoglobin: 13.6 g/dL (ref 13.0–17.0)
Hemoglobin: 13.6 g/dL (ref 13.0–17.0)
MCH: 31.9 pg (ref 26.0–34.0)
MCH: 32.3 pg (ref 26.0–34.0)
MCHC: 35.2 g/dL (ref 30.0–36.0)
MCV: 90.6 fL (ref 78.0–100.0)
Platelets: 235 10*3/uL (ref 150–400)
RBC: 4.21 MIL/uL — ABNORMAL LOW (ref 4.22–5.81)
RBC: 4.26 MIL/uL (ref 4.22–5.81)
RDW: 13.2 % (ref 11.5–15.5)
WBC: 6.9 10*3/uL (ref 4.0–10.5)

## 2012-02-29 LAB — CARDIAC PANEL(CRET KIN+CKTOT+MB+TROPI)
CK, MB: 24.3 ng/mL (ref 0.3–4.0)
Total CK: 552 U/L — ABNORMAL HIGH (ref 7–232)

## 2012-02-29 LAB — HEMOGLOBIN A1C: Hgb A1c MFr Bld: 7.6 % — ABNORMAL HIGH (ref <5.7)

## 2012-02-29 LAB — GLUCOSE, CAPILLARY

## 2012-02-29 MED ORDER — DIAZEPAM 5 MG PO TABS
5.0000 mg | ORAL_TABLET | ORAL | Status: AC
  Administered 2012-03-01: 5 mg via ORAL
  Filled 2012-02-29: qty 1

## 2012-02-29 MED ORDER — INSULIN ASPART 100 UNIT/ML ~~LOC~~ SOLN
0.0000 [IU] | Freq: Three times a day (TID) | SUBCUTANEOUS | Status: DC
  Administered 2012-02-29 – 2012-03-02 (×3): 2 [IU] via SUBCUTANEOUS

## 2012-02-29 MED ORDER — SODIUM CHLORIDE 0.9 % IV SOLN
1.0000 mL/kg/h | INTRAVENOUS | Status: DC
  Administered 2012-02-29: 1 mL/kg/h via INTRAVENOUS

## 2012-02-29 MED ORDER — PANTOPRAZOLE SODIUM 40 MG PO TBEC
40.0000 mg | DELAYED_RELEASE_TABLET | Freq: Every day | ORAL | Status: DC
  Administered 2012-02-29 – 2012-03-02 (×3): 40 mg via ORAL
  Filled 2012-02-29 (×3): qty 1

## 2012-02-29 MED ORDER — SODIUM CHLORIDE 0.9 % IV SOLN: 250.0000 mL | INTRAVENOUS | Status: DC | PRN

## 2012-02-29 MED ORDER — SODIUM CHLORIDE 0.9 % IJ SOLN: 3.0000 mL | INTRAMUSCULAR | Status: DC | PRN

## 2012-02-29 MED FILL — Atropine Sulfate Inj 0.1 MG/ML: INTRAMUSCULAR | Qty: 10 | Status: AC

## 2012-02-29 NOTE — Clinical Documentation Improvement (Signed)
GENERIC DOCUMENTATION CLARIFICATION QUERY  THIS DOCUMENT IS NOT A PERMANENT PART OF THE MEDICAL RECORD  TO RESPOND TO THE THIS QUERY, FOLLOW THE INSTRUCTIONS BELOW:  1. If needed, update documentation for the patient's encounter via the notes activity.  2. Access this query again and click edit on the In [employer].  3. After updating, or not, click F2 to complete all highlighted (required) fields concerning your review. Select "additional documentation in the medical record" OR "no additional documentation provided".  4. Click Sign note button.  5. The deficiency will fall out of your In Basket *Please let us know if you are not able to complete this workflow by phone or e-mail (listed below).  Please update your documentation within the medical record to reflect your response to this query.                                                                                        02/29/12   Dear [facility],  In a better effort to capture your patient's severity of illness, reflect appropriate length of stay and utilization of resources, a review of the patient medical record has revealed the following indicators. Based on your clinical judgment, please clarify and document in a progress note and/or discharge summary the clinical condition associated with the following supporting information: In responding to this query please exercise your independent judgment.  The fact that a query is asked, does not imply that any particular answer is desired or expected.  Possible Clinical Conditions?  Cardiac Arrest  Other Condition  Cannot Clinically Determine   Supporting Information:   Risk Factors: anterior STEMI  Signs & Symptoms: The patient arrived to the emergency room. As soon as I got in the emergency room, the patient became profoundly bradycardic and almost soft systolic. CPR was administered. He was given atropine plus amp of epinephrine. pt with unresponsive with  brady rhythm with no pulse present, compressions initiated and atropine given. CPR performed for approx 30 secs with return of circulation.   Diagnostics: assessment: no pulse, unresponsive, total lad occlusion per cath  Treatment: compressions, atropine. CPR, Cath with DES  You may use possible, probable, or suspect with inpatient documentation. possible, probable, suspected diagnoses MUST be documented at the time of discharge  Reviewed: additional documentation in the medical record  Thank You,  Amada Kingfisher RN, BSN, CCM  Clinical Documentation Specialist: [email or web address]  [phone]   Health Information Management [place]

## 2012-02-29 NOTE — Progress Notes (Signed)
Subjective:  No chest pain.  Objective:  Vital Signs in the last 24 hours: Temp:  [98.3 F (36.8 C)-99.1 F (37.3 C)] 98.7 F (37.1 C) (04/23 0519) Pulse Rate:  [55-63] 56  (04/23 0519) Resp:  [15-18] 17  (04/23 0519) BP: (98-123)/(53-66) 116/60 mmHg (04/23 0519) SpO2:  [94 %-97 %] 95 % (04/23 0519)  Intake/Output from previous day:  Intake/Output Summary (Last 24 hours) at 02/29/12 0855 Last data filed at 02/28/12 1600  Gross per 24 hour  Intake  822.6 ml  Output    401 ml  Net  421.6 ml    Physical Exam: General appearance: alert, cooperative and no distress Lungs: clear to auscultation bilaterally Heart: regular rate and rhythm Rt groin without hematoma   Rate: 38-70  Rhythm: normal sinus rhythm, sinus bradycardia and pause up to 2.7 sec, sinub brady down to 40.  Lab Results:  Basename 02/28/12 2354 02/28/12 0510  WBC 8.3 9.0  HGB 13.6 13.8  PLT 240 277    Basename 02/28/12 2354 02/28/12 0510  NA 138 136  K 4.2 3.8  CL 106 105  CO2 23 19  GLUCOSE 149* 145*  BUN 18 14  CREATININE 1.00 0.89    Basename 02/28/12 2353 02/28/12 1645  TROPONINI 16.68* 22.87*   Hepatic Function Panel  Basename 02/27/12 1250  PROT 6.6  ALBUMIN 3.5  AST 17  ALT 10  ALKPHOS 54  BILITOT 0.4  BILIDIR --  IBILI --    Basename 02/28/12 0857  CHOL 169    Basename 02/27/12 1250  INR 1.06    Imaging: Imaging results have been reviewed  Cardiac Studies:  Assessment/Plan:   Principal Problem:  *STEMI, Ant, EF 55% Active Problems:  S/P coronary artery stent placement, emergently to 100% occl. LAD with DES X 2.    CAD (coronary artery disease), residual 90% RCA  Bradycardia  HTN (hypertension)  Dyslipidemia, statin added  Plan- discussed with Dr Herbie Baltimore, plan for PCI in am. Will hold beta blocker today and try and resume at lower dose in am. Glucose is running high, check Hgb A1c.  Corine Shelter PA-C 02/29/2012, 8:55 AM  I have seen & examined the patient  this PM & reviewed the chart as well as cath films.  As per discussion with Dr. Tresa Endo, he has 2 remaining lesions - most severe in RCA as well as bifurcation LCx-OM lesion.  Will plan for PCI of RCA +/- OM tomorrow.  Procedure:  Staged Percutaneous Coronary Intervention of RCA & LCx-OM.  The procedure with Risks/Benefits/Alternatives and Indications was reviewed with the patient and wife.  All questions were answered.    Risks / Complications include, but not limited to: Death, MI, CVA/TIA, VF/VT (with defibrillation), Bradycardia (need for temporary pacer placement), contrast induced nephropathy, bleeding / bruising / hematoma / pseudoaneurysm, vascular or coronary injury (with possible emergent CT or Vascular Surgery), adverse medication reactions, infection.    The patient (and family) voice understanding and agree to proceed.   I have signed the consent form and placed it on the chart for patient signature and RN witness.     Marykay Lex, M.D., M.S. [facility] [address] [place], [place]  [address]  [phone]  02/29/2012 5:37 PM

## 2012-02-29 NOTE — Progress Notes (Addendum)
Patient had a 2.25 second pause. Vitals T 98.6, P 56, R17 BP 116/60, 02 95 RA EKG showed Normal Sinus Rhythm, Anterior Infarct, T wave abnormality, consider inferolateral ischemia. MD notified. Rapid Response called to assess the patient. Will continue to monitor.

## 2012-02-29 NOTE — Progress Notes (Signed)
ANTICOAGULATION CONSULT NOTE - Follow Up Consult  Pharmacy Consult for heparin Indication: chest pain/ACS  Vital Signs: Temp: 98.7 F (37.1 C) (04/23 0519) Temp src: Oral (04/23 0519) BP: 116/60 mmHg (04/23 0519) Pulse Rate: 56  (04/23 0519)  Labs:  Basename 02/28/12 2354 02/28/12 2353 02/28/12 1731 02/28/12 1645 02/28/12 0857 02/28/12 0510 02/27/12 1250  HGB 13.6 -- -- -- -- 13.8 --  HCT 38.2* -- -- -- -- 38.6* 42.4  PLT 240 -- -- -- -- 277 278  APTT -- -- -- -- -- -- 27  LABPROT -- -- -- -- -- -- 14.0  INR -- -- -- -- -- -- 1.06  HEPARINUNFRC 0.39 -- 0.33 -- -- -- --  CREATININE 1.00 -- -- -- -- 0.89 0.85  CKTOTAL -- 552* -- 723* 1064* -- --  CKMB -- 24.3* -- 40.9* 81.4* -- --  TROPONINI -- 16.68* -- 22.87* >25.00* -- --   Estimated Creatinine Clearance: 69 ml/min (by C-G formula based on Cr of 1).  Medications:  Infusions:    . sodium chloride 20 mL/hr (02/28/12 0944)  . heparin 1,200 Units/hr (02/28/12 1133)  . DISCONTD: nitroGLYCERIN 2 mcg/min (02/28/12 0700)   Assessment: 76 yom on heparin s/p cath procedure with DES to ALD awaiting staged PCI to RCA and possible LCX tomorrow. Heparin level is therapeutic at 0.39. No bleeding noted.  Goal of Therapy:  Heparin level 0.3-0.7 units/ml   Plan:  1. Continue heparin at 1200units/hr 2. F/u AM heparin level  Vickee Mormino, Drake Leach 02/29/2012,9:01 AM

## 2012-02-29 NOTE — Progress Notes (Signed)
CARDIAC REHAB PHASE I   PRE:  Rate/Rhythm: 66 SR    BP: sitting 110/60    SaO2:   MODE:  Ambulation: 320 ft   POST:  Rate/Rhythm: 68 SR    BP: sitting 112/60     SaO2:   Tolerated slow walk well. No c/o. Began ed. Pt apparently has a hx of Hgb A1C 7.4 without medication. Awaiting new A1C. Gave DM diet. [id number]  Lonnie Marshall CES, ACSM

## 2012-03-01 ENCOUNTER — Encounter (HOSPITAL_COMMUNITY)
Admission: EM | Disposition: A | Discharge status: Home / Self Care | Payer: Self-pay | Source: Home / Self Care | Attending: Cardiovascular Disease

## 2012-03-01 DIAGNOSIS — G4733 Obstructive sleep apnea (adult) (pediatric): Secondary | ICD-10-CM | POA: Diagnosis present

## 2012-03-01 HISTORY — PX: CORONARY ANGIOPLASTY WITH STENT PLACEMENT: SHX49

## 2012-03-01 HISTORY — PX: PERCUTANEOUS CORONARY STENT INTERVENTION (PCI-S): SHX5485

## 2012-03-01 LAB — CBC
HCT: 34.7 % — ABNORMAL LOW (ref 39.0–52.0)
Hemoglobin: 12.3 g/dL — ABNORMAL LOW (ref 13.0–17.0)
MCH: 31.9 pg (ref 26.0–34.0)
MCHC: 35.4 g/dL (ref 30.0–36.0)
RDW: 13.3 % (ref 11.5–15.5)

## 2012-03-01 LAB — GLUCOSE, CAPILLARY
Glucose-Capillary: 146 mg/dL — ABNORMAL HIGH (ref 70–99)
Glucose-Capillary: 113 mg/dL — ABNORMAL HIGH (ref 70–99)
Glucose-Capillary: 162 mg/dL — ABNORMAL HIGH (ref 70–99)

## 2012-03-01 SURGERY — PERCUTANEOUS CORONARY STENT INTERVENTION (PCI-S)
Anesthesia: Choice

## 2012-03-01 SURGERY — PERCUTANEOUS CORONARY STENT INTERVENTION (PCI-S)
Anesthesia: LOCAL

## 2012-03-01 MED ORDER — LIDOCAINE HCL (PF) 1 % IJ SOLN
INTRAMUSCULAR | Status: AC
  Filled 2012-03-01: qty 30

## 2012-03-01 MED ORDER — HEPARIN (PORCINE) IN NACL 2-0.9 UNIT/ML-% IJ SOLN
INTRAMUSCULAR | Status: AC
  Filled 2012-03-01: qty 1000

## 2012-03-01 MED ORDER — SODIUM CHLORIDE 0.9 % IV SOLN
0.1500 mg/kg/h | INTRAVENOUS | Status: AC
  Administered 2012-03-01: 12.105 mg/h via INTRAVENOUS
  Filled 2012-03-01: qty 250

## 2012-03-01 MED ORDER — SODIUM CHLORIDE 0.9 % IJ SOLN
3.0000 mL | Freq: Two times a day (BID) | INTRAMUSCULAR | Status: DC
  Administered 2012-03-01 – 2012-03-02 (×2): 3 mL via INTRAVENOUS

## 2012-03-01 MED ORDER — FENTANYL CITRATE 0.05 MG/ML IJ SOLN
INTRAMUSCULAR | Status: AC
  Filled 2012-03-01: qty 2

## 2012-03-01 MED ORDER — NITROGLYCERIN 0.2 MG/ML ON CALL CATH LAB
INTRAVENOUS | Status: AC
  Filled 2012-03-01: qty 1

## 2012-03-01 MED ORDER — BIVALIRUDIN 250 MG IV SOLR
INTRAVENOUS | Status: AC
  Filled 2012-03-01: qty 250

## 2012-03-01 MED ORDER — SODIUM CHLORIDE 0.9 % IJ SOLN: 3.0000 mL | INTRAMUSCULAR | Status: DC | PRN

## 2012-03-01 MED ORDER — SODIUM CHLORIDE 0.9 % IV SOLN: 250.0000 mL | INTRAVENOUS | Status: DC

## 2012-03-01 MED ORDER — SODIUM CHLORIDE 0.9 % IV SOLN
1.0000 mL/kg/h | INTRAVENOUS | Status: AC
  Administered 2012-03-01: 1 mL/kg/h via INTRAVENOUS

## 2012-03-01 NOTE — Progress Notes (Signed)
Patient had a 3.98 second pause at 5:44 AM, and a 4.53 second pause at 4:02 AM. Patient asymptomatic and resting  Comfortably. Heart rate dropping into the 30s-40s.  Vitals T 98.8 P 68 R20 BP 125/65 02 94 RA. EKG showed Sinus Bradycardia, Incomplete right bundle branch block. ST & T wave abnormality, consider anterolateral ischemia. Dr on call notified. Told to continue to monitor patient, and to call if patient has any more pauses greater than 3 seconds. Will continue to monitor.

## 2012-03-01 NOTE — Progress Notes (Signed)
Subjective:  No chest pain or SOB. Bradycardia and pauses up to 4.5 seconds 3-4am. He has no hx of syncope. His wife says he does snore and occasionally has periods of apnea at night.  Objective:  Vital Signs in the last 24 hours: Temp:  [98.6 F (37 C)-99.6 F (37.6 C)] 98.8 F (37.1 C) (04/24 0425) Pulse Rate:  [61-63] 63  (04/24 0425) Resp:  [17-20] 20  (04/24 0425) BP: (106-125)/(57-70) 125/65 mmHg (04/24 0425) SpO2:  [94 %-96 %] 94 % (04/24 0425)  Intake/Output from previous day:  Intake/Output Summary (Last 24 hours) at 03/01/12 0806 Last data filed at 02/29/12 0900  Gross per 24 hour  Intake    240 ml  Output      0 ml  Net    240 ml    Physical Exam: General appearance: alert, cooperative and no distress Lungs: clear to auscultation bilaterally Heart: regular rate and rhythm   Rate: 60  Rhythm: normal sinus rhythm, sinus bradycardia and pauses up to 4.5seconds 3-4am.  Lab Results:  Basename 03/01/12 0542 02/29/12 1002  WBC 6.8 6.9  HGB 12.3* 13.6  PLT 213 235    Basename 02/29/12 1002 02/28/12 2354  NA 137 138  K 3.8 4.2  CL 106 106  CO2 21 23  GLUCOSE 209* 149*  BUN 15 18  CREATININE 0.86 1.00    Basename 02/28/12 2353 02/28/12 1645  TROPONINI 16.68* 22.87*   Hepatic Function Panel  Basename 02/27/12 1250  PROT 6.6  ALBUMIN 3.5  AST 17  ALT 10  ALKPHOS 54  BILITOT 0.4  BILIDIR --  IBILI --    Basename 02/28/12 0857  CHOL 169    Basename 02/27/12 1250  INR 1.06    Imaging: Imaging results have been reviewed  Cardiac Studies:  Assessment/Plan:   Principal Problem:  *STEMI, Ant, EF 55% Active Problems:  Bradycardia, pauses up to 4.5 seconds while sleeping  S/P coronary artery stent placement, emergently to 100% occl. LAD with DES X 2.    CAD (coronary artery disease), residual 90% RCA  Sleep apnea by history  HTN (hypertension)  Dyslipidemia, statin added   Plan- discussed with Dr Herbie Baltimore, proceed with PCI to RCA,  T-TVDP if needed. He will need sleep study and monitor at discharge. NO BETA BLOCKER  Corine Shelter PA-C 03/01/2012, 8:06 AM  Pt seen & examined this AM.  Bradycardia noted.  HR now in 70s. Agree with holding BB. Sleep study may be reasonable - will defer to Dr. Tresa Endo. Plan is staged PCI today to RCA +/- LCx.  Marykay Lex, M.D., M.S. [facility] [address] [place], [place]  [address]  [phone] Pager # [phone]  03/01/2012 9:17 AM

## 2012-03-01 NOTE — Progress Notes (Signed)
CARDIAC REHAB PHASE I   PRE:  Rate/Rhythm: 58 SB  BP:  Supine:   Sitting: 132/73  Standing:    SaO2:   MODE:  Ambulation: 420 ft   POST:  Rate/Rhythem:   BP:  Supine: 1  Sitting: 132/73  Standing:    SaO2: 97 RA 1020-1040 Tolerated ambulation well without c/o of cp or SOB. States he is ready to go home. Back to side of bed after walk.  Beatrix Fetters

## 2012-03-01 NOTE — Progress Notes (Signed)
Site area: right groin  Site Prior to Removal:  Level 1  Pressure Applied For 20 MINUTES    Minutes Beginning at 1525  Manual:   yes  Patient Status During Pull:  stable  Post Pull Groin Site:  Level 1  Post Pull Instructions Given:  yes  Post Pull Pulses Present:  yes  Dressing Applied:  yes  Comments:  Patient had previous cath 02/27/12 at same groin site possible bruising from previous intervention site is soft to touch no problems with hemostasis.

## 2012-03-01 NOTE — Brief Op Note (Signed)
02/27/2012 - 03/01/2012  1:10 PM  PATIENT:  Lonnie Marshall  76 y.o. male who presented 4/21 with Ant STEMI - 100% LAD treated with 2 overlapping Promus DES stents - also noted to have severe RCA & OM1 lesions -- planned staged PCI.  SURGEON:  Surgeon(s) and Role:    * Marykay Lex, MD - Primary  PROCEDURE:  Procedure(s) (LRB):   PERCUTANEOUS CORONARY STENT INTERVENTION (PCI-S) (N/A) of RCA -- Promus DES 3.0 mm x 16 mm  PERCUTANEOUS CORONARY STENT INTERVENTION (PCI-S) of LCx-OM1 -- Promus DES 2.5 mm x 12 mm  PRE-OPERATIVE DIAGNOSIS:  CAD, 90% RCA; 85% Ostial OM1  POST-OPERATIVE DIAGNOSIS:   Successful 2 vessel PCI of mid RCA & Ostial OM1 (jailing AVG Circ with brisk distal flow & no stenosis)  AoP: 101/48 mmHg; mean 72 mmHg  ANESTHESIA:   local and IV sedation; 25 mcg Fentanyl (5mg  Valium pre-med) LOCAL MEDICATIONS USED:  LIDOCAINE 18ml EBL:   < 20 ml  MEDICATIONS ADMINISTERED:  Agiomax Bolux & drip (wgt based) during case; NS bolus, IC NTG  EQUIPMENT: 6 Fr RCFA sheath  RCA:  JR4 guide; BMW wire; pre-dilation Emerge 2.0 mm x 12 mm & 2.5 mm x 12 mm  Stent: Promus DES 3.0 mm x 12 mm; Post-dilation of mid stent with [place] Quantum Apex 3.25 mm x 12 mm  LCx-OM1 (crossing AVG Cx): XB 3.5with side-holes; BMW wire; pre-dilation Emerge 2.0 mm x 12 mm  Stent: Promus DES 2.5 mm x 12 mm ; Post-dilation [place] Quantum Apex 2.5 mm -- (prox & distal step-up & step down noted) no dissection or perforation; jailed AVG Circ with TIMI 3 flow & minimal residual stenosis.  SHEATH REMOVAL:  Manual pressure in holding area - fluoroscopy guided access.  DICTATION: .Note written in EPIC  PLAN OF CARE: Admit for overnight observation  PATIENT DISPOSITION:  PACU - hemodynamically stable.   Delay start of Pharmacological VTE agent (>24hrs) due to surgical blood loss or risk of bleeding: not applicable   Discussed with patient & family & Dr. Tresa Endo notified.  Marykay Lex, M.D.,  M.S. [facility] [address] [place], [place]  [address]  [phone] Pager # [phone]  03/01/2012 1:21 PM

## 2012-03-02 ENCOUNTER — Encounter (HOSPITAL_COMMUNITY): Payer: Self-pay

## 2012-03-02 ENCOUNTER — Encounter (HOSPITAL_COMMUNITY): Payer: Self-pay | Admitting: Dietician

## 2012-03-02 LAB — BASIC METABOLIC PANEL
BUN: 13 mg/dL (ref 6–23)
Calcium: 8.1 mg/dL — ABNORMAL LOW (ref 8.4–10.5)
GFR calc non Af Amer: 79 mL/min — ABNORMAL LOW (ref >90)
Glucose, Bld: 119 mg/dL — ABNORMAL HIGH (ref 70–99)
Sodium: 139 mEq/L (ref 135–145)

## 2012-03-02 LAB — CBC
Hemoglobin: 11.5 g/dL — ABNORMAL LOW (ref 13.0–17.0)
MCH: 30.7 pg (ref 26.0–34.0)
MCHC: 34.2 g/dL (ref 30.0–36.0)

## 2012-03-02 MED ORDER — RAMIPRIL 2.5 MG PO CAPS
2.5000 mg | ORAL_CAPSULE | Freq: Every day | ORAL | Status: DC
  Administered 2012-03-02: 2.5 mg via ORAL
  Filled 2012-03-02: qty 1

## 2012-03-02 MED ORDER — ATORVASTATIN CALCIUM 80 MG PO TABS: 80.0000 mg | ORAL_TABLET | Freq: Every day | ORAL | Status: DC

## 2012-03-02 MED ORDER — TICAGRELOR 90 MG PO TABS: 90.0000 mg | ORAL_TABLET | Freq: Two times a day (BID) | ORAL | Status: DC

## 2012-03-02 MED ORDER — ISOSORBIDE MONONITRATE 15 MG HALF TABLET: 15.0000 mg | ORAL_TABLET | Freq: Every day | ORAL | Status: DC

## 2012-03-02 MED ORDER — RAMIPRIL 2.5 MG PO CAPS: 2.5000 mg | ORAL_CAPSULE | Freq: Every day | ORAL | Status: DC

## 2012-03-02 MED ORDER — ASPIRIN 81 MG PO TBEC: 81.0000 mg | DELAYED_RELEASE_TABLET | Freq: Every day | ORAL | Status: DC

## 2012-03-02 MED ORDER — POTASSIUM CHLORIDE CRYS ER 20 MEQ PO TBCR
40.0000 meq | EXTENDED_RELEASE_TABLET | Freq: Once | ORAL | Status: AC
  Administered 2012-03-02: 40 meq via ORAL
  Filled 2012-03-02: qty 2

## 2012-03-02 MED FILL — Dextrose Inj 5%: INTRAVENOUS | Qty: 50 | Status: AC

## 2012-03-02 NOTE — CV Procedure (Addendum)
[facility]     CARDIAC CATHETERIZATION REPORT  NAME:  Lonnie Marshall   MRN: [id number] DATE OF BIRTH:  August 28, 1936  ADMIT DATE:  02/27/2012  Performing Cardiologist: Marykay Lex, M.D., M.S. Primary Physician: No primary provider on file. Primary Cardiologist:  Lennette Bihari., MD  Procedures Performed:  Native Coronary Angiography  IC NTG Injection -- 200 mcg x 1  Percutaneous Coronary Artery Intervention on the Mid RCA 90% lesion with a Promus Element DES 3.0 mm x 16 mm ; final diameter 3.25 mm -- reduced to 0%  Percutaneous Coronary Artery Intervention on Ostial OM 70-80%  (crossing the AV Groove/mid Circumflex) with a Promus Element DES 2.5 mm x 12 mm ; final diameter 2.5 mm -- reduced to 0%  Indication(s): Severe Multivessel CAD with recent Anterior STEMI; Staged PCI  History: 76 y.o. male with a history of HTN (& likely new diagnosis of Diabetes,HLD and is a former smoker who presented on 02/27/2012 with an Anterior STEMI and underwent emergent PCI of a 100% occlusion (with multiple lesions requiring multiple stents).  Diagnostic images prior to PCI identified 2 additional severe lesions, most notably a ~90-95% mid RCA lesion that was felt to be severe enough to do as a staged PCI during the initial hospitalization.  In order to complete revascularization, he is referred for staged PCI of both the RCA and OM.  Consent: The procedure with Risks/Benefits/Alternatives and Indications was reviewed with the patient and family.  All questions were answered.    Risks / Complications include, but not limited to: Death, MI, CVA/TIA, VF/VT (with defibrillation), Bradycardia (need for temporary pacer placement), contrast induced nephropathy, bleeding / bruising / hematoma / pseudoaneurysm, vascular or coronary injury (with possible emergent CT or Vascular Surgery), adverse medication reactions, infection.    The patient and family voice understanding and agree to  proceed.   Consent for signed by MD and patient with RN witness -- placed on chart.  Procedure: The patient was brought to the 2nd Floor [facility] in the fasting state and prepped and draped in the usual sterile fashion for Right groin access. Sterile technique was used including antiseptics, cap, gloves, gown, hand hygiene, mask and sheet.  Skin prep: Chlorhexidine;  Time Out: Verified patient identification, verified procedure, site/side was marked, verified correct patient position, special equipment/implants available, medications/allergies/relevent history reviewed, required imaging and test results available.  Performed  The right femoral head was identified using tactile and fluoroscopic technique.  The right groin was anesthetized with 1% subcutaneous Lidocaine.  The right Common Femoral Artery was accessed using the Modified Seldinger Technique, under direct fluoroscopic guidance, with placement of a antimicrobial bonded/coated single lumen (6 Fr) sheath.  The sheath was aspirated and flushed.    A 6 Fr JR4 Guide Catheter was advanced of over a Versicore wire into the ascending Aorta, and used to engage the Right Coronary Artery.  Guiding cineangiographic views of the Right Coronary Artery system(s) were performed.  PCI of the RCA was performed as detailed below.  The initial guide catheter was exchanged first for a 6 Fr XB 3.5 Guide catheter, but engagement of the LMCA resulted in pressure damping & ventricularization.  Therefore, this was exchanged for a XB 3.5 Guide with sideholes to engage the LMCA.  Guiding cineangiographic views of the Left Coronary Artery system were performed, and PCI of the ostial OM lesion was performed as described below.  Follwing completion of the PCI procedure, the patient  was transferred to the holding area where the sheath was removed 2 after post procedure -- after Angiomax was discontinued, with manual pressure held for  hemostasis.    Hemodynamics:  Central Aortic Pressure : 101/72 mmHg Left Ventriculography: Not peformed  Coronary Angiographic Data:  Left Main:  Moderate caliber vessel that bifurcates normally into the LAD and Left Circumflex.  Left Anterior Descending (LAD):  Moderate caliber vessel with a proximal septal trunk that has 3 branches (one of which appears to course as a diagonal branch).  There is ~20-30% ostial stenosis.  The tandem, overlapping stents in the mid vessel are widely patent, and there is a small caliber jailed diagonal branch with minimal ostial stenosis.  Circumflex (LCx):  Moderate caliber vessel with ostial ~30% stenosis, the vessel remains smaller in caliber than the OM.  The vessel bifurcates early on into a major, OM1 and the mid Circumflex with a ~20% stenosis in the "ostium" of the follow-on mid Circ that terminates as an OM2 giving rise to the AV Groove branch with small posterolateral branches.  This portion of the vessel has inimal luminal irregularities and is a ~6mm vessel.  1st obtuse marginal:  This is a small to moderate caliber vessel that has an ostial-proximal ~80% tubular stenosis followed by a short "normal" segment and a ~30% focal narrowing at a bend before continuing with minimal luminal irregularities as a moderate caliber vessel.  Right Coronary Artery: Moderate to large caliber vessel with a mid-vessel lesion that tapers from a ~50% lesion to ~95% lesion; the remainder of the vessel has minimal luminal irregularities until the bifurcation into the Right Posterior Atrio-Ventricular and Posterior Descending branches.  posterior descending artery: small caliber - 30% proximal stenosis  posterior lateral branch:  Very small caliber vessel that trifurcates into 3 tiny posterolateral branches; there is a ~70% stenosis at this trifurcation involving all 3 branches.  PERCUTANEOUS CORONARY INTERVENTION PROCEDURE  After reviewing the initial cineangiography  images, the culprit lesion(s) was identified, and the decision was made to proceed with percutaneous coronary intervention.   A weight based bolus of IV Angiomax was administered and the drip was continued until completion of the procedure. An ACT  > 200 Sec was confirmed prior to advancing the Guidewire. Oral Ticagrelor 90 mg had been administered prior to arrival to the catheterization laboratory.    Lesion #1:  Mid RCA  Pre-PCI Stenosis: 50-95 % Post-PCI Stenosis: 0 %     TIMI 3 flow       TIMI 3 flow  Guide Catheter: 6Fr JR4  Guidewire: BMW  Pre-Dilitation Balloon: Emerge 2.0 mm x 12 mm  1st Inflation: 8 Atm for 45 Sec Scout angiography did not reveal evidence of dissection or perforation. At this point, the stent was not able to pass beyond the first segment of stenosis, so a larger pre-dilation balloon was used.  Pre-Dilitation Balloon: Emerge 2.4 mm x 12 mm  1st Inflation: 8 Atm for 45 Sec  Stent: Promus Element  DES 3.0 mm x 16 mm  Deployment: 12 Atm for 30 Sec  Scout angiography did not reveal evidence of dissection or perforation.  Post-Dilitation Balloon: [place] Quantum Apex 3.25 mm x 12 mm  1st Inflation: 12 Atm for 45 Sec - distal  2nd Inflation: 12 Atm for 45 Sec - proxima; final diameter 3.25 mm  Post-deployment cineangiography with and without guidewire in place was performed in multiple orthogonal views demonstrating excellent stent deployment and did not reveal evidence of dissection or  perforation    Lesion # 2:  Ostial OM1; due to the location of the stenosis, it was necessary to cross the mid Circumflex with the planned stent.  Pre-PCI Stenosis: 80 % Post-PCI Stenosis: 0 %     TIMI 3 flow       TIMI 3 flow  Guide Catheter: 6Fr XB 3.5 with sideholes Guidewire: BMW  Pre-Dilitation Balloon: Emerge 2.0 mm x 12 mm  1st Inflation: 4 Atm for 45 Sec Scout angiography did not reveal evidence of dissection or perforation.  There was no evidence of "plaque shift" into  the mid Circumflex.   Stent: Promus Element DES  2.5 mm x 12mm  Deployment:  12 Atm for 30 Sec    Scout angiography did not reveal evidence of dissection or perforation.  The wire was withdrawn to keep the soft portion of the wire in the stented segment to better visualize the proximal Circumflex without "wire bias" as there was initial concern for a proximal edge lesion.  On further review in other angles, the area in question was actually a small branch that took a posterior approach up and over the main circumflex.   Post-Dilitation Balloon: [place] Quantum Apex 2.5 mm   1st Inflation: 12 Atm for 30 Sec   Post-deployment cineangiography with and without guidewire in place was performed in multiple orthogonal views demonstrating excellent stent expansion with step-up and step-down.  There was no evidence of dissection or perforation.  The follow-on mid Circumflex had no evidence of stenosis from "jailing" by the stent.  The patient was transported to the PACU holding area in hemodynamically stable, chest pain free condition.   The patient  was stable before, during and following the procedure.   Patient did tolerate procedure well. There were not complications. EBL: < 20 ml  Medications:  Premedication: 5 mg  Valium,  Sedation:  0 mg IV Versed, 25 IV mcg Fentanyl  Contrast:  185 ml Omnipaque  Anticoagulation: Angiomax Bolus & drip (0.75 mg/kg; 1.75 mg/kg/hr)  IC Nitroglycerin Injection - x 1  NS Bolus 500 ml  Impression:  Successful 2 vessel PCI of mid RCA & Ostial OM1 (jailing AVG Circ with brisk distal flow & no stenosis)  Plan:  Standard post PCI care with sheath removal.  Monitor overnight with planned discharge in the AM if stable.  Continue to hold Beta Blocker due to bradycardia and pauses, but consider adding ACE-I/ARB in the AM if BP sufficient.  The case and results was discussed with the patient and family. The case and results was discussed with the  patient's Cardiologist.  Time Spend Directly with Patient:  55 minutes  Brittnae Aschenbrenner W, M.D., M.S. [facility] [address] [place], [place]  [address]  [phone]

## 2012-03-02 NOTE — Consult Note (Signed)
Pt quit smoking in 1976. He however has continued to chew tobacco for the past 50+ years. Discussed risk factors of chewing tobacco and how it affects his cardiovascular health. Pt says he plans to quit cold [place]. Mentioned different pharmacotherapy options available if cold [place] doesn't work out for him. Pt verbalizes understanding.

## 2012-03-02 NOTE — Discharge Instructions (Signed)

## 2012-03-02 NOTE — Progress Notes (Signed)
CARDIAC REHAB PHASE I   PRE:  Rate/Rhythm: 69SR  BP:  Supine:   Sitting: 125/68  Standing:    SaO2:   MODE:  Ambulation: 680 ft   POST:  Rate/Rhythem: 93SR  BP:  Supine:   Sitting: 127/65  Standing:    SaO2:  0853-0949 Pt walked 680 ft on RA with steady gait. Denied chest pain. Tolerated well. Education completed. Pt's wife with lots of diet questions. Reviewed diabetic diet. Discussed that pt's HGA1C was 7.6. Pt's [facility]. Declined Phase 2 due to financial reasons. Gave brochure.  Duanne Limerick

## 2012-03-02 NOTE — Progress Notes (Signed)
[facility]  Subjective: He slept well last night with the be elevated at ~30 deg.  No chest pain or SOB.  Objective: Vital signs in last 24 hours: Temp:  [98.3 F (36.8 C)-99.8 F (37.7 C)] 98.8 F (37.1 C) (04/25 0742) Pulse Rate:  [50-69] 69  (04/25 0742) Resp:  [14-20] 16  (04/25 0742) BP: (108-135)/(51-76) 124/66 mmHg (04/25 0742) SpO2:  [93 %-99 %] 97 % (04/25 0742) Weight:  [83.4 kg (183 lb 13.8 oz)] 83.4 kg (183 lb 13.8 oz) (04/25 0731) Last BM Date: 02/28/12  Intake/Output from previous day: 04/24 0701 - 04/25 0700 In: 360 [P.O.:360] Out: 1 [Urine:1] Intake/Output this shift:    Medications Current Facility-Administered Medications  Medication Dose Route Frequency Provider Last Rate Last Dose  . 0.9 %  sodium chloride infusion   Intravenous Continuous Nada Boozer, NP 20 mL/hr at 02/28/12 0944 20 mL/hr at 02/28/12 0944  . 0.9 %  sodium chloride infusion  1 mL/kg/hr Intravenous Continuous Marykay Lex, MD 80.7 mL/hr at 03/01/12 1300 1 mL/kg/hr at 03/01/12 1300  . 0.9 %  sodium chloride infusion  250 mL Intravenous Continuous Marykay Lex, MD      . acetaminophen (TYLENOL) tablet 650 mg  650 mg Oral Q4H PRN Lennette Bihari, MD   650 mg at 03/01/12 2204  . ALPRAZolam Prudy Feeler) tablet 0.25 mg  0.25 mg Oral TID PRN Abelino Derrick, PA      . aspirin EC tablet 81 mg  81 mg Oral Daily Lennette Bihari, MD   81 mg at 03/01/12 1015  . atorvastatin (LIPITOR) tablet 80 mg  80 mg Oral q1800 Lennette Bihari, MD   80 mg at 03/01/12 2206  . atropine injection   Intravenous PRN Dione Booze, MD   1 mg at 02/27/12 1151  . bivalirudin (ANGIOMAX) 0.5 mg/mL in sodium chloride 0.9 % 500 mL infusion  0.15 mg/kg/hr Intravenous To Cath Marykay Lex, MD      . bivalirudin (ANGIOMAX) 250 MG injection           . diazepam (VALIUM) tablet 5 mg  5 mg Oral On Call Abelino Derrick, PA   5 mg at 03/01/12 1039  . fentaNYL (SUBLIMAZE) 0.05 MG/ML injection           .  heparin 2-0.9 UNIT/ML-% infusion           . insulin aspart (novoLOG) injection 0-9 Units  0-9 Units Subcutaneous TID WC Eda Paschal [place], PA   2 Units at 03/01/12 1919  . isosorbide mononitrate (IMDUR) 24 hr tablet 15 mg  15 mg Oral Daily Nada Boozer, NP   15 mg at 03/01/12 1015  . lidocaine (XYLOCAINE) 1 % injection           . morphine 2 MG/ML injection 2 mg  2 mg Intravenous Q2H PRN Lennette Bihari, MD      . nitroGLYCERIN (NTG ON-CALL) 0.2 mg/mL injection           . omega-3 acid ethyl esters (LOVAZA) capsule 1 g  1 g Oral Daily Nada Boozer, NP   1 g at 03/01/12 1014  . ondansetron (ZOFRAN) injection 4 mg  4 mg Intravenous Q6H PRN Lennette Bihari, MD      . oxybutynin (DITROPAN) tablet 10 mg  10 mg Oral Q8H Nada Boozer, NP   10 mg at 03/02/12 0552  . pantoprazole (PROTONIX) EC tablet 40 mg  40  mg Oral Q0600 Eda Paschal [place], [place]   40 mg at 03/02/12 5784  . senna-docusate (Senokot-S) tablet 1 tablet  1 tablet Oral Daily Nada Boozer, NP   1 tablet at 03/01/12 1015  . sodium chloride 0.9 % injection 3 mL  3 mL Intravenous Q12H Marykay Lex, MD   3 mL at 03/01/12 2206  . sodium chloride 0.9 % injection 3 mL  3 mL Intravenous PRN Marykay Lex, MD      . Ticagrelor [facility]) tablet 90 mg  90 mg Oral BID Lennette Bihari, MD   90 mg at 03/01/12 2203  . traMADol (ULTRAM) tablet 50 mg  50 mg Oral Q6H PRN Abelino Derrick, PA      . zolpidem [facility]) tablet 5 mg  5 mg Oral QHS PRN Lennette Bihari, MD      . DISCONTD: 0.9 %  sodium chloride infusion  250 mL Intravenous PRN Abelino Derrick, PA      . DISCONTD: 0.9 %  sodium chloride infusion  1 mL/kg/hr Intravenous Continuous Abelino Derrick, PA 80.7 mL/hr at 02/29/12 2143 1 mL/kg/hr at 02/29/12 2143  . DISCONTD: heparin ADULT infusion 100 units/mL (25000 units/250 mL)  1,200 Units/hr Intravenous Continuous Chinita [place], PHARMD 12 mL/hr at 03/01/12 0454 1,200 Units/hr at 03/01/12 0454  . DISCONTD: sodium chloride 0.9 % injection 3 mL  3 mL Intravenous PRN  Abelino Derrick, PA        PE: General appearance: alert, cooperative and no distress Lungs: clear to auscultation bilaterally Heart: regular rate and rhythm, S1, S2 normal, no murmur, click, rub or gallop Extremities: 1+ LEE Pulses: 2+ and symmetric Right Groin:  Non tender moderate ecchymosis.  No bruit or hematoma  Lab Results:   Basename 03/02/12 0426 03/01/12 0542 02/29/12 1002  WBC 6.9 6.8 6.9  HGB 11.5* 12.3* 13.6  HCT 33.6* 34.7* 38.6*  PLT 216 213 235   BMET  Basename 03/02/12 0426 02/29/12 1002 02/28/12 2354  NA 139 137 138  K 3.4* 3.8 4.2  CL 108 106 106  CO2 22 21 23   GLUCOSE 119* 209* 149*  BUN 13 15 18   CREATININE 0.99 0.86 1.00  CALCIUM 8.1* 8.6 8.6   PT/INR No results found for this basename: LABPROT:3,INR:3 in the last 72 hours Cholesterol  Basename 02/28/12 0857  CHOL 169   Cardiac Enzymes No components found with this basename: TROPONIN:3, CKMB:3  Studies/Results: 03/01/12, Left Heart Cath  SURGEON: Surgeon(s) and Role:  * Marykay Lex, MD - Primary  PROCEDURE: Procedure(s) (LRB):  PERCUTANEOUS CORONARY STENT INTERVENTION (PCI-S) (N/A) of RCA -- Promus DES 3.0 mm x 16 mm  PERCUTANEOUS CORONARY STENT INTERVENTION (PCI-S) of LCx-OM1 -- Promus DES 2.5 mm x 12 mm PRE-OPERATIVE DIAGNOSIS: CAD, 90% RCA; 85% Ostial OM1  POST-OPERATIVE DIAGNOSIS:  Successful 2 vessel PCI of mid RCA & Ostial OM1 (jailing AVG Circ with brisk distal flow & no stenosis)  AoP: 101/48 mmHg; mean 72 mmHg ANESTHESIA: local and IV sedation; 25 mcg Fentanyl (5mg  Valium pre-med)  LOCAL MEDICATIONS USED: LIDOCAINE 18ml  EBL: < 20 ml  MEDICATIONS ADMINISTERED: Agiomax Bolux & drip (wgt based) during case; NS bolus, IC NTG  EQUIPMENT: 6 Fr RCFA sheath  RCA: JR4 guide; BMW wire; pre-dilation Emerge 2.0 mm x 12 mm & 2.5 mm x 12 mm  Stent: Promus DES 3.0 mm x 12 mm; Post-dilation of mid stent with Bennett Quantum Apex 3.25 mm x 12 mm  LCx-OM1 (crossing AVG Cx): XB 3.5with  side-holes; BMW wire; pre-dilation Emerge 2.0 mm x 12 mm  Stent: Promus DES 2.5 mm x 12 mm ; Post-dilation Keys Quantum Apex 2.5 mm -- (prox & distal step-up & step down noted) no dissection or perforation; jailed AVG Circ with TIMI 3 flow & minimal residual stenosis. SHEATH REMOVAL: Manual pressure in holding area - fluoroscopy guided access.  DICTATION: .Note written in EPIC  PLAN OF CARE: Admit for overnight observation  PATIENT DISPOSITION: PACU - hemodynamically stable.  Delay start of Pharmacological VTE agent (>24hrs) due to surgical blood loss or risk of bleeding: not applicable  Discussed with patient & family & Dr. Tresa Endo notified.  Marykay Lex, M.D., M.S.  [facility]  [address]  [place], [place] [address]  [phone]  Pager # [phone]  03/01/2012  1:21 PM  Assessment/Plan   Principal Problem:  *STEMI, Ant, EF 55% Active Problems:  S/P coronary artery stent placement, emergently to 100% occl. LAD with DES X 2.    HTN (hypertension)  Dyslipidemia, statin added  CAD (coronary artery disease), residual 90% RCA  Bradycardia, pauses up to 4.5 seconds while sleeping  Sleep apnea by history Hypokalemia  Plan:  S/P STEMI, Ant 02/27/12.  S/P Promus DES to the RCA and LCx 03/01/12.  BB held due to 4.5 sec pause yesterday.  Sleep study needed due to apneic events.  2.73 sec pause at 0316 hrs today.  Repleting K+.  BP  108/57 - 135/70.   ASA/ Statin/ Imdur/ Lovaza/ Brilinta.  Tobacco cessation consult prior to DC(Chews tobacco).  Cardiac rehab following.  EF 55% with mild distal anterolateral and apical hypokinesis from left heart cath.  ? Need for 2D echo prior to DC.  May need PRN diuretic.  HGB A1C 7.6.  Will refer to PCP for MGT.     LOS: 4 days    Lonnie Marshall,Lonnie Marshall 03/02/2012 8:13 AM     Patient seen and examined. Agree with assessment and plan.  Feels great.  Ambulating.  For DC today. Will add low dose ACE-I. Cannot take  beta-blocker presently due to bradycardia with pauses.  Now that RCA is opened, may have less postential bradycardia if related to RCA ischemia.  Also, since episodes are often nocturnal, question sleep apnea.  Will DC today and plan monitor, sleep study and echo as outpt. K replete.   Lennette Bihari, MD, [facility] 03/02/2012 9:07 AM

## 2012-03-03 LAB — GLUCOSE, CAPILLARY: Glucose-Capillary: 128 mg/dL — ABNORMAL HIGH (ref 70–99)

## 2012-03-03 NOTE — Discharge Summary (Signed)
Physician Discharge Summary  [name] MRN: [id number] DOB/AGE: 1936-07-26 76 y.o.  Admit date: 02/27/2012 Discharge date: 03/03/2012  Admission Diagnoses:  STEMI, Anterior  Discharge Diagnoses:  Principal Problem:  *STEMI, Ant, EF 55% Active Problems:  S/P coronary artery stent placement, emergently to 100% occl. LAD with DES X 2.    HTN (hypertension)  Dyslipidemia, statin added  CAD (coronary artery disease), residual 90% RCA  Bradycardia, pauses up to 4.5 seconds while sleeping  Sleep apnea by history   Discharged Condition: stable  Hospital Course:   The patient is a 76 year old Caucasian male with a history of hypertension, hyperlipidemia, remote back surgery, tobacco abuse, but has quit smoking and uses oral tobacco. Patient stated that last Tuesday he developed an episode of chest pain just the proximal to the manubrium. He took 2 TUMS and the pain resolved. No further episodes of chest discomfort until Sunday morning when he developed chest pain in the same area as well as a feeling of arm heaviness. He had associated diaphoresis and shortness of breath as well as blurry and double vision. Patient initially continued on to church but pain became more severe.  EMS was subsequently called.   He was taken directly to [facility] where EKG revealed the ST elevation in V3 through 6 predominantly in the 4 and 5.   CPR was required and epinephrine and atropine were administered the heart rate increased. The patient was taken emergently to the cath lab.  Left heart catheterization revealed three-vessel disease with total occlusion of the left anterior descending.  Successful percutaneous coronary intervention to the total left anterior descending occlusion with 100% stenosis being reduced to 0%(See full report below).     I&D 2 the patient had no complaints. Blood pressure was borderline and ACE inhibitor was held.  Patient was started on statin and beta blocker his nitro drip  was changed to Imdur.  LV gram showed preserved LV function.  Clinic rehabilitation was initiated.  Staged intervention to the RCA and left circumflex obtuse marginal was completed on 03/01/2012. Patient received 2 Promus drug-eluting stents to the RCA went to the left circumflex obtuse marginal #1( see full report below).  Beta blocker was held due to 4.5 second pause the patient's heart rate is likely related to sleep apnea. The following day the patient had the 2.73 second pause also while sleeping.  Beta blocker was discontinued.  The patient was seen by Dr. Tresa Endo felt he was stable for discharge home on 03/02/2012.  Plans for outpatient sleep study, echocardiogram, 30 day monitor were arranged. Low-dose ramipril was started.  Consults: tobacco cessation  Significant Diagnostic Studies:  03/01/2012, left heart catheterization/PCI  Hemodynamics:  Central Aortic Pressure : 101/72 mmHg  Left Ventriculography: Not peformed  Coronary Angiographic Data:  Left Main: Moderate caliber vessel that bifurcates normally into the LAD and Left Circumflex.  Left Anterior Descending (LAD): Moderate caliber vessel with a proximal septal trunk that has 3 branches (one of which appears to course as a diagonal branch). There is ~20-30% ostial stenosis. The tandem, overlapping stents in the mid vessel are widely patent, and there is a small caliber jailed diagonal branch with minimal ostial stenosis.  Circumflex (LCx): Moderate caliber vessel with ostial ~30% stenosis, the vessel remains smaller in caliber than the OM. The vessel bifurcates early on into a major, OM1 and the mid Circumflex with a ~20% stenosis in the "ostium" of the follow-on mid Circ that terminates as an OM2 giving rise  to the AV Groove branch with small posterolateral branches. This portion of the vessel has inimal luminal irregularities and is a ~66mm vessel.  1st obtuse marginal: This is a small to moderate caliber vessel that has an ostial-proximal  ~80% tubular stenosis followed by a short "normal" segment and a ~30% focal narrowing at a bend before continuing with minimal luminal irregularities as a moderate caliber vessel.  Right Coronary Artery: Moderate to large caliber vessel with a mid-vessel lesion that tapers from a ~50% lesion to ~95% lesion; the remainder of the vessel has minimal luminal irregularities until the bifurcation into the Right Posterior Atrio-Ventricular and Posterior Descending branches.  posterior descending artery: small caliber - 30% proximal stenosis  posterior lateral branch: Very small caliber vessel that trifurcates into 3 tiny posterolateral branches; there is a ~70% stenosis at this trifurcation involving all 3 branches. PERCUTANEOUS CORONARY INTERVENTION PROCEDURE  After reviewing the initial cineangiography images, the culprit lesion(s) was identified, and the decision was made to proceed with percutaneous coronary intervention.  A weight based bolus of IV Angiomax was administered and the drip was continued until completion of the procedure. An ACT > 200 Sec was confirmed prior to advancing the Guidewire.  Oral Ticagrelor 90 mg had been administered prior to arrival to the catheterization laboratory.  Lesion #1: Mid RCA  Pre-PCI Stenosis: 50-95 % Post-PCI Stenosis: 0 %  TIMI 3 flow TIMI 3 flow  Guide Catheter: 6Fr JR4 Guidewire: BMW Pre-Dilitation Balloon: Emerge 2.0 mm x 12 mm 1st Inflation: 8 Atm for 45 Sec Scout angiography did not reveal evidence of dissection or perforation.  At this point, the stent was not able to pass beyond the first segment of stenosis, so a larger pre-dilation balloon was used.  Pre-Dilitation Balloon: Emerge 2.4 mm x 12 mm 1st Inflation: 8 Atm for 45 Sec Stent: Promus Element DES 3.0 mm x 16 mm Deployment: 12 Atm for 30 Sec  Scout angiography did not reveal evidence of dissection or perforation.  Post-Dilitation Balloon: [place] Quantum Apex 3.25 mm x 12 mm  1st Inflation: 12 Atm  for 45 Sec - distal  2nd Inflation: 12 Atm for 45 Sec - proxima; final diameter 3.25 mm Post-deployment cineangiography with and without guidewire in place was performed in multiple orthogonal views demonstrating excellent stent deployment and did not reveal evidence of dissection or perforation  Lesion # 2: Ostial OM1; due to the location of the stenosis, it was necessary to cross the mid Circumflex with the planned stent.  Pre-PCI Stenosis: 80 % Post-PCI Stenosis: 0 %  TIMI 3 flow TIMI 3 flow  Guide Catheter: 6Fr XB 3.5 with sideholes Guidewire: BMW Pre-Dilitation Balloon: Emerge 2.0 mm x 12 mm  1st Inflation: 4 Atm for 45 Sec Scout angiography did not reveal evidence of dissection or perforation. There was no evidence of "plaque shift" into the mid Circumflex.  Stent: Promus Element DES 2.5 mm x 12mm Deployment: 12 Atm for 30 Sec Scout angiography did not reveal evidence of dissection or perforation. The wire was withdrawn to keep the soft portion of the wire in the stented segment to better visualize the proximal Circumflex without "wire bias" as there was initial concern for a proximal edge lesion. On further review in other angles, the area in question was actually a small branch that took a posterior approach up and over the main circumflex.  Post-Dilitation Balloon: Despard Quantum Apex 2.5 mm  1st Inflation: 12 Atm for 30 Sec Post-deployment cineangiography with and without guidewire  in place was performed in multiple orthogonal views demonstrating excellent stent expansion with step-up and step-down. There was no evidence of dissection or perforation. The follow-on mid Circumflex had no evidence of stenosis from "jailing" by the stent.  The patient was transported to the PACU holding area in hemodynamically stable, chest pain free condition.  The patient was stable before, during and following the procedure.  Patient did tolerate procedure well.  There were not complications.  EBL: < 20 ml    Medications:  Premedication: 5 mg Valium,  Sedation: 0 mg IV Versed, 25 IV mcg Fentanyl  Contrast: 185 ml Omnipaque  Anticoagulation: Angiomax Bolus & drip (0.75 mg/kg; 1.75 mg/kg/hr)  IC Nitroglycerin Injection - x 1  NS Bolus 500 ml  Impression:  Successful 2 vessel PCI of mid RCA & Ostial OM1 (jailing AVG Circ with brisk distal flow & no stenosis) Plan:  Standard post PCI care with sheath removal.  Monitor overnight with planned discharge in the AM if stable.  Continue to hold Beta Blocker due to bradycardia and pauses, but consider adding ACE-I/ARB in the AM if BP sufficient. The case and results was discussed with the patient and family.  The case and results was discussed with the patient's Cardiologist.  Time Spend Directly with Patient:  55 minutes  HARDING,DAVID W, M.D., M.S.  [facility]  [address]  [place], [place] [address]  [phone]     PHYSICIAN: Nicki Guadalajara, M.D. DATE OF BIRTH: 09-23-1936  DATE OF PROCEDURE:  02/27/2012 DATE OF DISCHARGE:  CARDIAC CATHETERIZATION  INDICATIONS: Lonnie Marshall is a 76 year old gentleman who has a  history of hypertension. He denied any known history of prior coronary  artery disease. He has remote tobacco history, but he quit smoking in  1975. Apparently at approximately 9:30 this morning, the patient  developed significant substernal chest pressure. EMS arrived at his  house where ST-segment elevation myocardial infarction was noted with  anterolateral ST-segment elevation. A code STEMI was called. The  patient arrived to the emergency room. As soon as I got in the  emergency room, the patient became profoundly bradycardic and almost  soft systolic. CPR was administered. He was given atropine plus amp of  epinephrine. His heart rate did increase. He was then escorted acutely  to the catheterization laboratory. He still had residual 2-3/10 chest  pain.  PROCEDURE: Upon  arrival to the catheterization laboratory, the patient  was alert with residual chest discomfort. Right femoral artery was  punctured anteriorly and a 6-French sheath was inserted without  difficulty. Diagnostic catheterization was done utilizing 6-French  Judkins 4 left and right coronary catheters. With demonstration of  total LAD occlusion after diagonal vessel, attempt was made to perform  acute intervention. A 6-French XB-LAD guide was used. Angiomax bolus  plus infusion was administered. Brilinta 180 mg was given orally. IC  nitroglycerin was also administered down the left coronary system.  Asahi medium wire was able to cross the total occlusion. Once the wire  was crossed, it appeared that there was significant ulcerated plaque at  the site of total occlusion. There was also diffuse distal disease in  the mid LAD segment with at least another area of 95% very eccentrically  calcified stenosis. Initial dilatation was done with a 2.0 x 12 mm  Emerge balloon at the initial site and then also subsequent  interventions were made at this more distal mid LAD site. There also  was significant  disease of 60-70% proximal to the diagonal vessel which  was proximal to the site of the initial occlusion. It was felt that in  order to optimally treat all areas, tandem long stenting was necessary.  A 2.5 x 38 mm Xience Prime LL DES stent was then inserted and advanced  beyond the septal perforating artery to cover the more distal 95%  stenosis. An additional 2.75 x 24 mm Promus Element stent was then  inserted in tandem fashion proximal to the stent to cover the most  proximal lesions proximal to the first diagonal vessel. These were  dilated x2. A 3.5 x 20 mm noncompliant Quantum balloon was used for  post-stent dilatation. Initially, this was placed distally and initial  dilatations were made up to approximately 2.8 mm. However, there was  still significant narrowing at the site that had a 95%  stenosis which  was eccentrically calcified. Eventually, this was further dilated. The  entire stented region was then dilated approximately up to 3.0 mm  corresponding to 12 atmospheres. After multiple inflations, the most  distal aspect of the stent was dilated to 2.94 mm. All others were  dilated to at least 3. Since there did appear to be 3 focal residual  areas of narrowing and particularly with the residual narrowing in the  more distally placed stent to the eccentric stenosis, a 3.0 x 8 mm  noncompliant Quantum balloon was then inserted. All focal sites were  then dilated to 14 atmospheres with this balloon corresponding to 3.05  mm. There was concern about further aggressively dilating the more  distal lesion due to concerns of potentially inducing perforation.  There was still residual narrowing of approximately 20% at this site,  but the stent appeared well apposed to the wall. There was brisk TIMI-3  flow. There was no evidence for dissection. The wire and arterial  sheath were then removed. A 6-French pigtail catheter was used for left  ventriculography. Due to conserve contrast, distal aortography was not  performed at this time. The patient will ultimately require stage  intervention. He left the catheterization laboratory with stable  hemodynamics, chest pain free.  HEMODYNAMIC DATA: Central aortic pressure was 110/55. Left ventricular  pressure 110/13. Post A-wave 23.  ANGIOGRAPHIC DATA: Left main coronary artery was angiographically  normal. It bifurcated into an LAD and left circumflex system.  The LAD had 20% ostial smooth narrowing. The proximal LAD had 60-70%  narrowing. After the miniature first diagonal vessel and before the  first septal perforating artery, a second diagonal vessel. The diagonal  vessel had 90% narrowing at its origin. The LAD was then totally  occluded after this diagonal vessel with TIMI 0 flow.  The circumflex vessel has a smooth 50% proximal  narrowing. There was 70-  80% narrowing in the circumflex marginal vessel after the AV groove  takeoff. The right coronary artery was a large-caliber dominant vessel  that had 20-30% proximal luminal irregularity. There was a 50% mid  narrowing. There was 90% stenosis just proximal to the acute margin.  The RCA supplied the PDA and PLA vessel.  The following intervention to the LAD system. Once the wire crossed the  100% occlusion and initial balloon dilatation was done. It became  apparent that at this site there was evidence for ulcerated plaque with  dissection at the site of the initial infarction. There was also  apparent high-grade 95% eccentric calcified stenosis in the mid segment.  This did not seem to dilate the  mid segment and then more distally mid  lesion although was dilated with the balloon. This did not seem to have  any effect. Ultimately, after placement of tandem 2.5 x 30 mm Xience  Prime LL DES stent and more proximally placed Promus Element 2.75 x 24  mm stent with post-stent dilatation up to at least 3.0 mm, the entire  region was sequentially reduced to 0%. However, high pressure very  focal dilatation was necessary up to 3.05 mm at 3 sites proximally at  the initial site of acute occlusion and then at the more distal mid site  which still had residual narrowing of approximately 20%. Despite high  pressure, very focal noncompliant balloon dilatation. There was brisk  TIMI 3 flow.  RAO ventriculography revealed ejection fraction of approximately 55%.  There was hypocontractility in distal anterolateral apical wall.  IMPRESSION:  1. Acute ST-segment elevation anterior wall myocardial infarction  secondary to total left anterior descending occlusion.  2. Three-vessel coronary artery disease with 60-70% proximal left  anterior descending stenosis proximal to the total occlusion, which  occurred after the second diagonal vessel with 90% stenosis of the  second diagonal  vessel and once opened 95% mid stenosis; 50%  circumflex stenosis followed by a 80-90% circumflex marginal  stenosis and 90% stenosis in the dominant right coronary artery.  3. Mild acute left ventricular dysfunction with preserved systolic  ejection fraction of 55%, but with mid distal apical anterolateral  hypokinesis.  4. Successful percutaneous coronary intervention to the total left  anterior descending occlusion with 100% stenosis being reduced to  0%. There was no evidence for significant left anterior descending  disease requiring tandem stenting with overlapping 2.75 x 24 and  2.5 x 38 mm DES stents postdilated to 3.0 mm with several focal  dilatations dilated up to 3.05 mm.  5. Bivalirudin/Brilinta/intracoronary nitroglycerin.  6. Chest pain onset approximately 9:30 am. The patient arrived to  [facility] at 1:147. The patient arrived to the cath lab at  11:52. First balloon inflation 12:17.  ______________________________  Nicki Guadalajara, M.D.   Treatments: PCI to the LAD, RCA, left circumflex OM1. Statin, aspirin, ACE inhibitor, Brilinta, Imdur   Discharge Exam: Blood pressure 124/66, pulse 69, temperature 98.8 F (37.1 C), temperature source Oral, resp. rate 16, height 5\' 11"  (1.803 m), weight 83.4 kg (183 lb 13.8 oz), SpO2 97.00%.   Disposition: 01-Home or Self Care  Discharge Orders    Future Orders Please Complete By Expires   Diet - low sodium heart healthy      Increase activity slowly      Discharge instructions      Comments:   No lifting or driving for three days.     Medication List  As of 03/03/2012 11:42 AM   STOP taking these medications         amLODipine 10 MG tablet         TAKE these medications         aspirin 81 MG EC tablet   Take 1 tablet (81 mg total) by mouth daily.      atorvastatin 80 MG tablet   Commonly known as: LIPITOR   Take 1 tablet (80 mg total) by mouth daily at 6 PM.      cholecalciferol 1000 UNITS tablet   Commonly  known as: VITAMIN D   Take 1,000 Units by mouth daily.      Fish Oil 1200 MG Caps   Take 1,200 mg by mouth 3 (  three) times daily.      isosorbide mononitrate 15 mg Tb24   Commonly known as: IMDUR   Take 0.5 tablets (15 mg total) by mouth daily.      oxybutynin 5 MG tablet   Commonly known as: DITROPAN   Take 10 mg by mouth 3 (three) times daily.      ramipril 2.5 MG capsule   Commonly known as: ALTACE   Take 1 capsule (2.5 mg total) by mouth daily.      senna-docusate 8.6-50 MG per tablet   Commonly known as: Senokot-S   Take 1 tablet by mouth daily.      Ticagrelor 90 MG Tabs tablet   Commonly known as: BRILINTA   Take 1 tablet (90 mg total) by mouth 2 (two) times daily.           Follow-up Information    Follow up with Dwana Melena, PA. (Our office will call with the appointment date and time,)    Contact information:   [address], [address] [place] [address] [phone]          Signed: Dwana Melena 03/03/2012, 11:42 AM

## 2012-03-14 ENCOUNTER — Encounter (HOSPITAL_COMMUNITY): Payer: Self-pay | Admitting: Pharmacy Technician

## 2012-03-14 ENCOUNTER — Ambulatory Visit
Admission: RE | Admit: 2012-03-14 | Discharge: 2012-03-14 | Disposition: A | Discharge status: Home / Self Care | Payer: Medicare Other | Source: Ambulatory Visit | Attending: Cardiovascular Disease | Admitting: Cardiovascular Disease

## 2012-03-14 ENCOUNTER — Other Ambulatory Visit: Payer: Self-pay | Admitting: Cardiovascular Disease

## 2012-03-14 DIAGNOSIS — Z01811 Encounter for preprocedural respiratory examination: Secondary | ICD-10-CM

## 2012-03-16 ENCOUNTER — Other Ambulatory Visit: Payer: Self-pay | Admitting: Cardiovascular Disease

## 2012-03-16 MED ORDER — CEFAZOLIN SODIUM-DEXTROSE 2-3 GM-% IV SOLR
2.0000 g | INTRAVENOUS | Status: DC
  Filled 2012-03-16: qty 50

## 2012-03-16 MED ORDER — SODIUM CHLORIDE 0.9 % IR SOLN
80.0000 mg | Status: DC
  Filled 2012-03-16: qty 2

## 2012-03-17 ENCOUNTER — Encounter (HOSPITAL_COMMUNITY): Payer: Self-pay | Admitting: General Practice

## 2012-03-17 ENCOUNTER — Encounter (HOSPITAL_COMMUNITY)
Admission: RE | Disposition: A | Discharge status: Home / Self Care | Payer: Self-pay | Source: Ambulatory Visit | Attending: Cardiovascular Disease

## 2012-03-17 ENCOUNTER — Ambulatory Visit (HOSPITAL_COMMUNITY)
Admission: RE | Admit: 2012-03-17 | Discharge: 2012-03-18 | Disposition: A | Discharge status: Home / Self Care | Payer: Medicare Other | Source: Ambulatory Visit | Attending: Cardiovascular Disease | Admitting: Cardiovascular Disease

## 2012-03-17 DIAGNOSIS — R001 Bradycardia, unspecified: Secondary | ICD-10-CM

## 2012-03-17 DIAGNOSIS — I495 Sick sinus syndrome: Secondary | ICD-10-CM | POA: Insufficient documentation

## 2012-03-17 DIAGNOSIS — I441 Atrioventricular block, second degree: Secondary | ICD-10-CM | POA: Insufficient documentation

## 2012-03-17 HISTORY — DX: Overactive bladder: N32.81

## 2012-03-17 HISTORY — DX: Presence of cardiac pacemaker: Z95.0

## 2012-03-17 HISTORY — DX: Pure hypercholesterolemia, unspecified: E78.00

## 2012-03-17 HISTORY — DX: Sleep apnea, unspecified: G47.30

## 2012-03-17 HISTORY — DX: Atherosclerotic heart disease of native coronary artery without angina pectoris: I25.10

## 2012-03-17 HISTORY — DX: Unspecified osteoarthritis, unspecified site: M19.90

## 2012-03-17 HISTORY — PX: PERMANENT PACEMAKER INSERTION: SHX5480

## 2012-03-17 HISTORY — DX: Type 2 diabetes mellitus without complications: E11.9

## 2012-03-17 HISTORY — DX: Unspecified fracture of skull, initial encounter for closed fracture: S02.91XA

## 2012-03-17 HISTORY — PX: INSERT / REPLACE / REMOVE PACEMAKER: SUR710

## 2012-03-17 SURGERY — PERMANENT PACEMAKER INSERTION
Anesthesia: LOCAL

## 2012-03-17 MED ORDER — SODIUM CHLORIDE 0.9 % IJ SOLN: 3.0000 mL | INTRAMUSCULAR | Status: DC | PRN

## 2012-03-17 MED ORDER — RAMIPRIL 2.5 MG PO CAPS
2.5000 mg | ORAL_CAPSULE | Freq: Every day | ORAL | Status: DC
  Administered 2012-03-18: 2.5 mg via ORAL
  Filled 2012-03-17: qty 1

## 2012-03-17 MED ORDER — ISOSORBIDE MONONITRATE 15 MG HALF TABLET: 15.0000 mg | ORAL_TABLET | Freq: Every day | ORAL | Status: DC

## 2012-03-17 MED ORDER — OXYBUTYNIN CHLORIDE 5 MG PO TABS
5.0000 mg | ORAL_TABLET | Freq: Three times a day (TID) | ORAL | Status: DC
  Administered 2012-03-17 – 2012-03-18 (×3): 5 mg via ORAL
  Filled 2012-03-17 (×6): qty 1

## 2012-03-17 MED ORDER — CEFAZOLIN SODIUM 1-5 GM-% IV SOLN
1.0000 g | Freq: Four times a day (QID) | INTRAVENOUS | Status: AC
  Administered 2012-03-17 – 2012-03-18 (×3): 1 g via INTRAVENOUS
  Filled 2012-03-17 (×3): qty 50

## 2012-03-17 MED ORDER — HYDROCODONE-ACETAMINOPHEN 5-325 MG PO TABS: 1.0000 | ORAL_TABLET | ORAL | Status: DC | PRN

## 2012-03-17 MED ORDER — SODIUM CHLORIDE 0.45 % IV SOLN: INTRAVENOUS | Status: DC

## 2012-03-17 MED ORDER — MIDAZOLAM HCL 5 MG/5ML IJ SOLN
INTRAMUSCULAR | Status: AC
  Filled 2012-03-17: qty 5

## 2012-03-17 MED ORDER — CEFAZOLIN SODIUM-DEXTROSE 2-3 GM-% IV SOLR
INTRAVENOUS | Status: AC
  Filled 2012-03-17: qty 50

## 2012-03-17 MED ORDER — ONDANSETRON HCL 4 MG/2ML IJ SOLN: 4.0000 mg | Freq: Four times a day (QID) | INTRAMUSCULAR | Status: DC | PRN

## 2012-03-17 MED ORDER — ACETAMINOPHEN 325 MG PO TABS
325.0000 mg | ORAL_TABLET | ORAL | Status: DC | PRN
  Administered 2012-03-17 (×2): 650 mg via ORAL
  Filled 2012-03-17 (×3): qty 2

## 2012-03-17 MED ORDER — ATORVASTATIN CALCIUM 80 MG PO TABS
80.0000 mg | ORAL_TABLET | Freq: Every day | ORAL | Status: DC
  Administered 2012-03-17: 80 mg via ORAL
  Filled 2012-03-17 (×2): qty 1

## 2012-03-17 MED ORDER — SENNOSIDES-DOCUSATE SODIUM 8.6-50 MG PO TABS
1.0000 | ORAL_TABLET | Freq: Every day | ORAL | Status: DC
  Administered 2012-03-17 – 2012-03-18 (×2): 1 via ORAL
  Filled 2012-03-17 (×2): qty 1

## 2012-03-17 MED ORDER — LIDOCAINE HCL (PF) 1 % IJ SOLN
INTRAMUSCULAR | Status: AC
  Filled 2012-03-17: qty 60

## 2012-03-17 MED ORDER — CHLORHEXIDINE GLUCONATE 4 % EX LIQD
60.0000 mL | Freq: Once | CUTANEOUS | Status: DC
  Filled 2012-03-17: qty 60

## 2012-03-17 MED ORDER — HEPARIN (PORCINE) IN NACL 2-0.9 UNIT/ML-% IJ SOLN
INTRAMUSCULAR | Status: AC
  Filled 2012-03-17: qty 1000

## 2012-03-17 MED ORDER — ASPIRIN EC 81 MG PO TBEC
81.0000 mg | DELAYED_RELEASE_TABLET | Freq: Every day | ORAL | Status: DC
  Administered 2012-03-18: 81 mg via ORAL
  Filled 2012-03-17: qty 1

## 2012-03-17 MED ORDER — ISOSORBIDE MONONITRATE 15 MG HALF TABLET
15.0000 mg | ORAL_TABLET | Freq: Every day | ORAL | Status: DC
  Administered 2012-03-18: 15 mg via ORAL
  Filled 2012-03-17: qty 1

## 2012-03-17 MED ORDER — TICAGRELOR 90 MG PO TABS
90.0000 mg | ORAL_TABLET | Freq: Two times a day (BID) | ORAL | Status: DC
  Administered 2012-03-17 – 2012-03-18 (×2): 90 mg via ORAL
  Filled 2012-03-17 (×4): qty 1

## 2012-03-17 MED ORDER — MUPIROCIN 2 % EX OINT
TOPICAL_OINTMENT | CUTANEOUS | Status: AC
  Filled 2012-03-17: qty 22

## 2012-03-17 MED ORDER — FENTANYL CITRATE 0.05 MG/ML IJ SOLN
INTRAMUSCULAR | Status: AC
  Filled 2012-03-17: qty 2

## 2012-03-17 NOTE — CV Procedure (Signed)
Eldredge,Thorvald Male, 76 y.o., 10-16-1936  MRN: [id number]  Procedure report  Procedure performed:  1. Implantation of new dual chamber permanent pacemaker 2. Fluoroscopy 3. Light sedation    Reason for procedure: Sinus node dysfunction Sinus arrest > 7 seconds Second degree atrioventricular block Mobitz type II  Procedure performed by: Thurmon Fair, MD  Complications: None  Estimated blood loss: <10 mL  Medications administered during procedure: Ancef 1 g intravenously Lidocaine 1% 30 mL locally,  Fentanyl 25 mcg intravenously Versed 3 mg intravenously  Device details:  Orthoptist DR RF model [id number] serial number [id number] Right atrial lead SJM Tendril STS 2088TC-52 cm serial number [id number] Right ventricular lead SJM Tendril STS 2088-TC -58 cm serial number [id number]  Procedure details:  After the risks and benefits of the procedure were discussed the patient provided informed consent and was brought to the cardiac cath lab in the fasting state. The patient was prepped and draped in usual sterile fashion. Local anesthesia with 1% lidocaine was administered to to the left infraclavicular area. A 5-6 cm horizontal incision was made parallel with and 2-3 cm caudal to the left clavicle. Using electrocautery and blunt dissection a prepectoral pocket was created down to the level of the pectoralis major muscle fascia. The pocket was carefully inspected for hemostasis. An antibiotic-soaked sponge was placed in the pocket.  Under fluoroscopic guidance and using the modified Seldinger technique 2 separate venipunctures were performed to access the left subclavian vein. No difficulty was encountered accessing the vein.  Two J-tip guidewires were subsequently exchanged for two 7 French safe sheaths.  Under fluoroscopic guidance the ventricular lead was advanced to level of the mid to apical right ventricular septum and thet active-fixation helix was deployed.  Prominent current of injury was seen. Satisfactory pacing and sensing parameters were recorded. There was no evidence of diaphragmatic stimulation at maximum device output. The safe sheath was peeled away and the lead was secured in place with 2-0 silk.  In similar fashion the right atrial lead was advanced to the level of the atrial appendage. The active-fixation helix was deployed. There was prominent current of injury. Satisfactory  pacing and sensing parameters were recorded. There was no evidence of diaphragmatic stimulation with pacing at maximum device output. The safe sheath was peeled away and the lead was secured in place with 2-0 silk.  The antibiotic-soaked sponge was removed from the pocket. The pocket was flushed with copious amounts of antibiotic solution. Reinspection showed excellent hemostasis..  The ventricular lead was connected to the generator and appropriate ventricular pacing was seen. Subsequently the atrial lead was also connected. Repeat testing of the lead parameters later showed excellent values.  The entire system was then carefully inserted in the pocket with care been taking that the leads and device assumed a comfortable position without pressure on the incision. Great care was taken that the leads be located deep to the generator. The pocket was then closed in layers using 2 layers of 2-0 Vicryl and cutaneous staples, after which a sterile dressing was applied.  At the end of the procedure the following lead parameters were encountered:  Right atrial lead  sensed P waves 4.2 mV, impedance 585 ohms, threshold 0.9 V at 0.4 ms pulse width.  Right ventricular lead sensed R waves 13.3 mV, impedance 635 ohms, threshold 1.3 V at 0.4 ms pulse width.

## 2012-03-17 NOTE — H&P (Signed)
Date of Initial H&P: 02/27/2012 and 03/08/2012 office note  History reviewed, patient examined, no change in status, stable for surgery. Severe sinus bradycardia with pauses up to 7.7 seconds and occasional high grade second degree AV block noted on monitor. For dual chamber pacemaker today. Thurmon Fair, MD, [facility] [phone] office [phone] pager 03/17/2012 10:37 AM

## 2012-03-18 ENCOUNTER — Ambulatory Visit (HOSPITAL_COMMUNITY): Payer: Medicare Other

## 2012-03-18 NOTE — Discharge Instructions (Signed)

## 2012-03-18 NOTE — Progress Notes (Signed)
[facility]  DAILY PROGRESS NOTE   Subjective:  No pain at device site. Feels well.  Objective:  Temp:  [97.1 F (36.2 C)-98.8 F (37.1 C)] 97.1 F (36.2 C) (05/11 0608) Pulse Rate:  [64-78] 70  (05/11 0608) Resp:  [18] 18  (05/11 0608) BP: (106-146)/(56-73) 141/71 mmHg (05/11 0608) SpO2:  [93 %-97 %] 93 % (05/11 0608) Weight:  [78.109 kg (172 lb 3.2 oz)] 78.109 kg (172 lb 3.2 oz) (05/11 6962) Weight change:   Intake/Output from previous day: 05/10 0701 - 05/11 0700 In: -  Out: 480 [Urine:480]  Intake/Output from this shift: Total I/O In: 360 [P.O.:360] Out: -   Medications: Current Facility-Administered Medications  Medication Dose Route Frequency Provider Last Rate Last Dose  . acetaminophen (TYLENOL) tablet 325-650 mg  325-650 mg Oral Q4H PRN Thurmon Fair, MD   650 mg at 03/17/12 2209  . aspirin EC tablet 81 mg  81 mg Oral Daily Noemi Bellissimo, MD   81 mg at 03/18/12 1004  . atorvastatin (LIPITOR) tablet 80 mg  80 mg Oral q1800 Deondrea Markos, MD   80 mg at 03/17/12 1709  . ceFAZolin (ANCEF) 2-3 GM-% IVPB SOLR           . ceFAZolin (ANCEF) IVPB 1 g/50 mL premix  1 g Intravenous Q6H Anne-Marie Genson, MD   1 g at 03/18/12 0424  . fentaNYL (SUBLIMAZE) 0.05 MG/ML injection           . heparin 2-0.9 UNIT/ML-% infusion           . heparin 2-0.9 UNIT/ML-% infusion           . HYDROcodone-acetaminophen (NORCO) 5-325 MG per tablet 1-2 tablet  1-2 tablet Oral Q4H PRN Retaj Hilbun, MD      . isosorbide mononitrate (IMDUR) 24 hr tablet 15 mg  15 mg Oral Daily Jaedan Huttner, MD   15 mg at 03/18/12 1005  . lidocaine (XYLOCAINE) 1 % injection           . midazolam (VERSED) 5 MG/5ML injection           . mupirocin ointment (BACTROBAN) 2 %           . ondansetron (ZOFRAN) injection 4 mg  4 mg Intravenous Q6H PRN Cyndal Kasson, MD      . oxybutynin (DITROPAN) tablet 5 mg  5 mg Oral TID Thurmon Fair, MD   5 mg at 03/18/12 1005  . ramipril (ALTACE)  capsule 2.5 mg  2.5 mg Oral Daily Myleka Moncure, MD   2.5 mg at 03/18/12 1004  . senna-docusate (Senokot-S) tablet 1 tablet  1 tablet Oral Daily Gavriel Holzhauer, MD   1 tablet at 03/18/12 1004  . Ticagrelor (BRILINTA) tablet 90 mg  90 mg Oral BID Jniya Madara, MD   90 mg at 03/18/12 1006  . DISCONTD: 0.45 % sodium chloride infusion   Intravenous Continuous Nickisha Hum, MD      . DISCONTD: ceFAZolin (ANCEF) IVPB 2 g/50 mL premix  2 g Intravenous On Call Thurmon Fair, MD      . DISCONTD: chlorhexidine (HIBICLENS) 4 % liquid 4 application  60 mL Topical Once Tyshawna Alarid, MD      . DISCONTD: gentamicin (GARAMYCIN) 80 mg in sodium chloride irrigation 0.9 % 500 mL irrigation  80 mg Irrigation On Call Thurmon Fair, MD      . DISCONTD: isosorbide mononitrate (IMDUR) 24 hr tablet 15 mg  15 mg Oral Daily  Thurmon Fair, MD      . DISCONTD: lidocaine (XYLOCAINE) 1 % injection           . DISCONTD: sodium chloride 0.9 % injection 3 mL  3 mL Intravenous PRN Thurmon Fair, MD        Physical Exam: General appearance: alert, cooperative and no distress Neck: no adenopathy, no carotid bruit, no JVD, supple, symmetrical, trachea midline and thyroid not enlarged, symmetric, no tenderness/mass/nodules Lungs: clear to auscultation bilaterally Heart: regular rate and rhythm, S1, S2 normal, no murmur, click, rub or gallop Neurologic: Alert and oriented X 3, normal strength and tone. Normal symmetric reflexes. Normal coordination and gait Surgical site is healthy without bleeding or pocket hematoma.  Lab Results: No results found for this or any previous visit (from the past 48 hour(s)).  Imaging: Dg Chest 2 View  03/18/2012  *RADIOLOGY REPORT*  Clinical Data: Pacemaker implantation yesterday  CHEST - 2 VIEW  Comparison: 03/14/2012  Findings: Cardiomediastinal silhouette is stable.  There is a dual lead cardiac pacemaker with left subclavian approach.  Tip of the leads are in right atrium and right  ventricle.  No acute infiltrate or pulmonary edema.  Minimal basilar atelectasis.  No diagnostic pneumothorax. Stable mild degenerative changes thoracic spine. Mild hyperinflation again noted.  IMPRESSION: Dual lead cardiac pacemaker in place.  No diagnostic pneumothorax.  Original Report Authenticated By: Natasha Mead, M.D.   PACEMAKER CHECK: 18% A paced, <1% V paced No mode switch RA lead P wave > 5 mV, impedance 580 ohm, threshold 0.75@0 .4ms RV lead R wave    9 mV, impedance 580 ohm, threshold 0.75@0 .4ms Assessment:  Plan:  1. DC home 2. Activity restrictions/wound care reviewed in detail 3. F/U wound check scheduled for May 17 4. Outpatient sleep study to be scheduled after 2 weeks 5. Continue all meds unchanged  Time Spent Directly with Patient:  30 minutes  Length of Stay:  LOS: 1 day    Allecia Bells 03/18/2012, 11:17 AM

## 2012-03-18 NOTE — Progress Notes (Signed)
Pt. Discharged 03/18/2012  2:04 PM Discharge instructions reviewed with patient/family. Patient/family verbalized understanding. All Rx's given. Questions answered as needed. Pt. Discharged to home with family/self.  Lonnie Marshall

## 2012-04-19 LAB — PACEMAKER DEVICE OBSERVATION

## 2013-03-20 ENCOUNTER — Other Ambulatory Visit: Payer: Self-pay | Admitting: Cardiovascular Disease

## 2013-03-20 LAB — REMOTE PACEMAKER DEVICE
AL THRESHOLD: 1 V
ATRIAL PACING PM: 22
DEVICE MODEL PM: 7336473
RV LEAD AMPLITUDE: 12 mv
RV LEAD IMPEDENCE PM: 490 Ohm
RV LEAD THRESHOLD: 1 V

## 2013-03-27 ENCOUNTER — Encounter: Payer: Self-pay | Admitting: Emergency Medicine

## 2013-03-29 ENCOUNTER — Encounter: Payer: Self-pay | Admitting: Cardiovascular Disease

## 2013-03-29 ENCOUNTER — Ambulatory Visit (INDEPENDENT_AMBULATORY_CARE_PROVIDER_SITE_OTHER): Payer: Medicare Other | Admitting: Cardiovascular Disease

## 2013-03-29 VITALS — BP 130/70 | HR 60 | Ht 69.0 in | Wt 168.0 lb

## 2013-03-29 DIAGNOSIS — I10 Essential (primary) hypertension: Secondary | ICD-10-CM

## 2013-03-29 DIAGNOSIS — I498 Other specified cardiac arrhythmias: Secondary | ICD-10-CM

## 2013-03-29 DIAGNOSIS — I200 Unstable angina: Secondary | ICD-10-CM

## 2013-03-29 DIAGNOSIS — F17200 Nicotine dependence, unspecified, uncomplicated: Secondary | ICD-10-CM

## 2013-03-29 DIAGNOSIS — R001 Bradycardia, unspecified: Secondary | ICD-10-CM

## 2013-03-29 DIAGNOSIS — Z950 Presence of cardiac pacemaker: Secondary | ICD-10-CM

## 2013-03-29 DIAGNOSIS — Z720 Tobacco use: Secondary | ICD-10-CM

## 2013-03-29 MED ORDER — METOPROLOL SUCCINATE ER 25 MG PO TB24: 12.5000 mg | ORAL_TABLET | Freq: Every day | ORAL | Status: DC

## 2013-03-29 MED ORDER — CLOPIDOGREL BISULFATE 75 MG PO TABS: ORAL_TABLET | ORAL | Status: DC

## 2013-03-29 NOTE — Patient Instructions (Addendum)
Switch to clopidogrel 75mg  once Brilinta tablets are used up.  After 1 month on clopidogrel, get bloodwork done.  Your physician recommends that you schedule a follow-up appointment in: 6 months

## 2013-04-03 ENCOUNTER — Ambulatory Visit (HOSPITAL_COMMUNITY)
Admission: RE | Admit: 2013-04-03 | Discharge: 2013-04-03 | Disposition: A | Discharge status: Home / Self Care | Payer: Medicare Other | Source: Ambulatory Visit | Attending: Cardiovascular Disease | Admitting: Cardiovascular Disease

## 2013-04-03 DIAGNOSIS — Z5181 Encounter for therapeutic drug level monitoring: Secondary | ICD-10-CM | POA: Insufficient documentation

## 2013-04-07 ENCOUNTER — Encounter: Payer: Self-pay | Admitting: Cardiovascular Disease

## 2013-04-07 DIAGNOSIS — Z950 Presence of cardiac pacemaker: Secondary | ICD-10-CM | POA: Insufficient documentation

## 2013-04-07 DIAGNOSIS — Z720 Tobacco use: Secondary | ICD-10-CM | POA: Insufficient documentation

## 2013-04-07 NOTE — Progress Notes (Signed)
[name], male   DOB: 07-13-1936, 77 y.o.   MRN: [id number]   HPI: Lonnie Marshall, is a 77 y.o. male who presents to the office today for cardiologic followup evaluation. I last saw him on 11/28/2012.  He is a 77 year old gentleman who suffered an anterior-wall myocardial infarction on February 27, 2012. He required CPR initially upon arrival to [facility] and was given atropine plus epinephrine and I escorted him to the catheterization laboratory. He was found to have total proximal LAD occlusion with severe multivessel CAD and underwent overlapping stents to his LAD system, 2.75 x 24 and 2.5 x 38 mm drug-eluting stents. Three days later he underwent staged intervention to his obtuse marginal, a vessel of his circumflex, and his right coronary artery. He also has a history of permanent pacemaker, history of complex sleep apnea with both obstructive and central, and has been on bi-level ST. In September 2013 a nuclear study demonstrated scar in the distal LAD territory anteroseptally and apically as well as in the apical inferior wall, but he did have significant myocardial salvage in other territories. Post-stress ejection fraction was 64%. In September 2013,  I reduced his atorvastatin from 80 to 40 mg.  Over the past several months the patient has continued to feel well. He is unaware of tachypalpitations. He has significantly changed his diet and avoids salt. He denies PND or orthopnea. He denies bleeding. He denies constipation. He denies edema. He denies recurrent angina. When I saw him in January, I elected to add very low-dose metoprolol succinate 12.5 mg to his medical regimen since he does have evidence for scar in the distal LAD territory and at the apex.  He tells me he did have kidney stones approximately 3 weeks ago.    Past Medical History  Diagnosis Date  . Hypertension   . S/P coronary artery stent placement, emergently to 100%  occl. LAD with DES X 2.   02/28/2012  . HTN (hypertension) 02/28/2012  . STEMI (ST elevation myocardial infarction), [facility]  02/27/2012  . Skull fracture 1942    "fell out of car; in hospital for some time"  . High cholesterol   . Overactive bladder   . Angina   . Pacemaker   . Sleep apnea     "suppose to have been tested 03/15/12"  . Arthritis   . Coronary artery disease   . Type II diabetes mellitus   . Kidney stones     "once"    Past Surgical History  Procedure Laterality Date  . Circumcision  1980's  . Insert / replace / remove pacemaker  03/17/12    "first one"  . Lumbar disc surgery  ~ 1985  . Back surgery    . Cardiac catheterization  02/27/12    "1"  . Coronary angioplasty with stent placement  03/01/12    "+2=3 total"    No Known Allergies  Current Outpatient Prescriptions  Medication Sig Dispense Refill  . aspirin EC 81 MG tablet Take 81 mg by mouth daily.      . isosorbide mononitrate (IMDUR) 30 MG 24 hr tablet Take 15 mg by mouth daily.      . metoprolol succinate (TOPROL-XL) 25 MG 24 hr tablet Take 0.5 tablets (12.5 mg total) by mouth daily.  15 tablet  6  . Omega-3 Fatty Acids (FISH OIL) 1200 MG CAPS Take 1,200 mg by mouth 2 (two) times daily.       Marland Kitchen oxybutynin (DITROPAN) 5 MG  tablet Take 5 mg by mouth 3 (three) times daily.       . ramipril (ALTACE) 2.5 MG capsule Take 2.5 mg by mouth daily.      Marland Kitchen senna-docusate (SENOKOT-S) 8.6-50 MG per tablet Take 1 tablet by mouth daily.      . tamsulosin (FLOMAX) 0.4 MG CAPS Take 0.4 mg by mouth daily.      . Ticagrelor (BRILINTA) 90 MG TABS tablet Take 90 mg by mouth 2 (two) times daily.      Marland Kitchen atorvastatin (LIPITOR) 80 MG tablet Take 40 mg by mouth daily at 6 PM.       . clopidogrel (PLAVIX) 75 MG tablet Take 1 tablet daily, start after Brilinta gone  90 tablet  3   No current facility-administered medications for this visit.     SOCIAL HISTORY: He is married, has 2 children, 3 grandchildren. He does still chew  occasional tobacco.   ROS negative for fever chills night sweats. At times he does note rare palpitation. He denies recent chest pain. He denies PND orthopnea. He is unaware of presyncope. He denies bleeding. He does have a history of an enlarged prostate. He denies recent leg swelling. He denies paresthesias. He denies constipation. Other system review is negative.  PE BP 130/70  Pulse 60  Ht 5\' 9"  (1.753 m)  Wt 168 lb (76.204 kg)  BMI 24.8 kg/m2  General: Alert, oriented, no distress.  HEENT: Normocephalic, atraumatic. Pupils round and reactive; sclera anicteric;  Nose without nasal septal hypertrophy Mouth/Parynx benign; Mallinpatti scale 3 Neck: No JVD, soft left carotid briut Lungs: clear to ausculatation and percussion; no wheezing or rales Heart: RRR, s1 s2 normal 1/6 sem Abdomen: soft, nontender; no hepatosplenomehaly, BS+; abdominal aorta nontender and not dilated by palpation. Diastasis recti Pulses 2+ Extremities: no clubbinbg cyanosis or edema, Homan's sign negative  Neurologic: grossly nonfocal  ECG: Atrially paced with prolonged AV conduction with PR interval at 262 ms. QTC interval 394 ms.  LABS:  BMET    Component Value Date/Time   NA 139 03/02/2012 0426   K 3.4* 03/02/2012 0426   CL 108 03/02/2012 0426   CO2 22 03/02/2012 0426   GLUCOSE 119* 03/02/2012 0426   BUN 13 03/02/2012 0426   CREATININE 0.99 03/02/2012 0426   CALCIUM 8.1* 03/02/2012 0426   GFRNONAA 79* 03/02/2012 0426   GFRAA >90 03/02/2012 0426     Hepatic Function Panel     Component Value Date/Time   PROT 6.6 02/27/2012 1250   ALBUMIN 3.5 02/27/2012 1250   AST 17 02/27/2012 1250   ALT 10 02/27/2012 1250   ALKPHOS 54 02/27/2012 1250   BILITOT 0.4 02/27/2012 1250     CBC    Component Value Date/Time   WBC 6.9 03/02/2012 0426   RBC 3.74* 03/02/2012 0426   HGB 11.5* 03/02/2012 0426   HCT 33.6* 03/02/2012 0426   PLT 216 03/02/2012 0426   MCV 89.8 03/02/2012 0426   MCH 30.7 03/02/2012 0426   MCHC 34.2  03/02/2012 0426   RDW 13.2 03/02/2012 0426     BNP No results found for this basename: probnp    Lipid Panel     Component Value Date/Time   CHOL 169 02/28/2012 0857   TRIG 104 02/28/2012 0857   HDL 41 02/28/2012 0857   CHOLHDL 4.1 02/28/2012 0857   VLDL 21 02/28/2012 0857   LDLCALC 107* 02/28/2012 0857     RADIOLOGY: No results found.    ASSESSMENT AND PLAN:  Clinically, Mr. Ramone is doing well without recurrent chest pain symptom. It is now one year since he suffered cardiac arrest requiring CPR in the setting of an anterior wall myocardial infarction. I did discuss with him the absolute importance of complete nicotine tobacco sensation. He didn't go he is not smoking, he is still observing nicotine with his chewing tobacco we'll have vasoconstrictive effects and adversely affect his cardiovascular health. We also discussed the ventral risk for cancer as well he does have a history of recent kidney stones. He also tells me he's been told of having an enlarged prostate. He is concerned about the cost of some of the medication. When he completes his current prescription for Brilinta, since it has been a year since his cardiac event, we will change this to Plavix. On Plavix I will then check a P2 Y12 lab work to make certain he is Plavix responsive. I will see him in 6 months for followup cardiology evaluation. He will continue [place] at home remote monitoring of his pacemaker and will see Dr. Royann Shivers to scheduled for his one-year pacemaker assessment.     Lennette Bihari, MD, [facility]  04/07/2013 1:52 PM

## 2013-04-18 ENCOUNTER — Ambulatory Visit (INDEPENDENT_AMBULATORY_CARE_PROVIDER_SITE_OTHER): Payer: Medicare Other | Admitting: Cardiovascular Disease

## 2013-04-18 ENCOUNTER — Encounter: Payer: Self-pay | Admitting: Cardiovascular Disease

## 2013-04-18 VITALS — BP 120/70 | HR 64 | Resp 20 | Ht 69.0 in | Wt 168.8 lb

## 2013-04-18 DIAGNOSIS — I2510 Atherosclerotic heart disease of native coronary artery without angina pectoris: Secondary | ICD-10-CM

## 2013-04-18 DIAGNOSIS — Z950 Presence of cardiac pacemaker: Secondary | ICD-10-CM

## 2013-04-18 DIAGNOSIS — I10 Essential (primary) hypertension: Secondary | ICD-10-CM

## 2013-04-18 DIAGNOSIS — I441 Atrioventricular block, second degree: Secondary | ICD-10-CM

## 2013-04-18 DIAGNOSIS — I471 Supraventricular tachycardia: Secondary | ICD-10-CM

## 2013-04-18 DIAGNOSIS — E785 Hyperlipidemia, unspecified: Secondary | ICD-10-CM

## 2013-04-18 MED ORDER — RAMIPRIL 2.5 MG PO CAPS: 5.0000 mg | ORAL_CAPSULE | Freq: Every day | ORAL | Status: DC

## 2013-04-18 MED ORDER — RAMIPRIL 5 MG PO CAPS
5.0000 mg | ORAL_CAPSULE | Freq: Every day | ORAL | Status: DC
Start: 2013-04-18 — End: 2013-06-29

## 2013-04-18 MED ORDER — RAMIPRIL 5 MG PO CAPS: 5.0000 mg | ORAL_CAPSULE | Freq: Every day | ORAL | Status: DC

## 2013-04-18 NOTE — Patient Instructions (Addendum)
Your physician has recommended you make the following change in your medication: Increase Ramipril to 5 mg daily  Your physician recommends that you schedule a follow-up appointment in: June 2015  Your Merlin monitor has been working appropriately. Please make sure the "green" light is on at all times. If the power goes out then please make sure to do a manual transmission to reset the monitor.

## 2013-04-19 ENCOUNTER — Telehealth: Payer: Self-pay | Admitting: *Deleted

## 2013-04-19 NOTE — Progress Notes (Signed)
INFORMED PATIENT AND WIFE THAT HE IS A RESPONDER TO PLAVIX. DR. Tresa Endo WILL DISCUSS IN FULL DETAIL AT NEXT OV.

## 2013-04-19 NOTE — Telephone Encounter (Signed)
INFORMED PATIENT TEST RESULTS WNL.

## 2013-04-23 DIAGNOSIS — I441 Atrioventricular block, second degree: Secondary | ICD-10-CM | POA: Insufficient documentation

## 2013-04-23 DIAGNOSIS — I471 Supraventricular tachycardia: Secondary | ICD-10-CM | POA: Insufficient documentation

## 2013-04-23 NOTE — Assessment & Plan Note (Signed)
Excellent values per most recent lipid profile

## 2013-04-23 NOTE — Assessment & Plan Note (Signed)
Dual-chamber St. Jude accent with normal function by recent merlin download on April 26. 20% atrial pacing, 3.6% ventricular pacing. One episode of mode switch lasting for roughly 7 minutes is consistent with a organized atrial tachycardia with one-to-one AV conduction no convincing atrial fibrillation is seen.

## 2013-04-23 NOTE — Assessment & Plan Note (Addendum)
Anterior wall ST segment elevation myocardial infarction April 2013 complicated by second-degree atrioventricular block. Treated with placement of 2.5 x 38 and 2.75 x 24 consecutive drug-eluting stents Subsequent drug-eluting stent to the right coronary artery (3 x 16 mm promus) and oblique marginal 1 (2.5 x 12 mm promus). Preserved left ventricular systolic function 64% by nuclear stress test September 2013 with fixed distal anterior and apical defect by perfusion imaging. Angina free/asymptomatic at this time.

## 2013-04-23 NOTE — Progress Notes (Signed)
[name], male   DOB: 12/20/1935, 77 y.o.   MRN: [id number]      Reason for office visit Second degree atrioventricular block  Atrial tachycardia  Pacemaker followup   Has been roughly one year since Lonnie Marshall presented with an acute anterolateral ST segment elevation myocardial infarction due to the LAD artery disease, complicated by second-degree atrial ventricular block that persisted for many days from the acute event. He received a pacemaker that time and had a recent normal device check. The device was recorded a few episodes of atrial arrhythmia including one 7 minute episode of paroxysmal atrial tachycardia that was asymptomatic.  He feels well and has no complaints today. He has preserved left ventricle systolic function. He has received a drug-eluting stents to all 3 major coronary arteries but had no evidence of reversible ischemia by nuclear stress testing last September. So far there has been no evidence of symptoms of restenosis. He switched from Brillinta to clopidogrel due to cost issues.    No Known Allergies  Current Outpatient Prescriptions  Medication Sig Dispense Refill  . aspirin EC 81 MG tablet Take 81 mg by mouth daily.      Marland Kitchen atorvastatin (LIPITOR) 80 MG tablet Take 40 mg by mouth daily at 6 PM.       . clopidogrel (PLAVIX) 75 MG tablet Take 1 tablet daily, start after Brilinta gone  90 tablet  3  . isosorbide mononitrate (IMDUR) 30 MG 24 hr tablet Take 15 mg by mouth daily.      . metoprolol succinate (TOPROL-XL) 25 MG 24 hr tablet Take 0.5 tablets (12.5 mg total) by mouth daily.  15 tablet  6  . Omega-3 Fatty Acids (FISH OIL) 1200 MG CAPS Take 1,200 mg by mouth 2 (two) times daily.       Marland Kitchen oxybutynin (DITROPAN) 5 MG tablet Take 5 mg by mouth 3 (three) times daily.       Marland Kitchen senna-docusate (SENOKOT-S) 8.6-50 MG per tablet Take 1 tablet by mouth daily.      . tamsulosin (FLOMAX) 0.4 MG CAPS Take 0.4 mg by mouth daily.      . Ticagrelor  (BRILINTA) 90 MG TABS tablet Take 90 mg by mouth 2 (two) times daily.      . ramipril (ALTACE) 5 MG capsule Take 1 capsule (5 mg total) by mouth daily.  90 capsule  3   No current facility-administered medications for this visit.    Past Medical History  Diagnosis Date  . Hypertension   . S/P coronary artery stent placement, emergently to 100% occl. LAD with DES X 2.   02/28/2012  . HTN (hypertension) 02/28/2012  . STEMI (ST elevation myocardial infarction), [facility] [facility]  02/27/2012  . Skull fracture 1942    "fell out of car; in hospital for some time"  . High cholesterol   . Overactive bladder   . Angina   . Pacemaker   . Sleep apnea     "suppose to have been tested 03/15/12"  . Arthritis   . Coronary artery disease   . Type II diabetes mellitus   . Kidney stones     "once"    Past Surgical History  Procedure Laterality Date  . Circumcision  1980's  . Insert / replace / remove pacemaker  03/17/12    "first one"  . Lumbar disc surgery  ~ 1985  . Back surgery    . Cardiac catheterization  02/27/12    "1"  .  Coronary angioplasty with stent placement  03/01/12    "+2=3 total"    No family history on file.  History   Social History  . Marital Status: Married    Spouse Name: N/A    Number of Children: N/A  . Years of Education: N/A   Occupational History  . Not on file.   Social History Main Topics  . Smoking status: Former Smoker -- 3.00 packs/day for 20 years    Types: Cigarettes    Quit date: 03/03/1975  . Smokeless tobacco: Current User    Types: Chew     Comment: 03/17/12 "cutting down chew by 2/3 already"  . Alcohol Use: No  . Drug Use: No  . Sexually Active: Not Currently   Other Topics Concern  . Not on file   Social History Narrative  . No narrative on file    Review of systems: The patient specifically denies any chest pain at rest or with exertion, dyspnea at rest or with exertion, orthopnea, paroxysmal nocturnal dyspnea, syncope, palpitations, focal  neurological deficits, intermittent claudication, lower extremity edema, unexplained weight gain, cough, hemoptysis or wheezing.  The patient also denies abdominal pain, nausea, vomiting, dysphagia, diarrhea, constipation, polyuria, polydipsia, dysuria, hematuria, frequency, urgency, abnormal bleeding or bruising, fever, chills, unexpected weight changes, mood swings, change in skin or hair texture, change in voice quality, auditory or visual problems, allergic reactions or rashes, new musculoskeletal complaints other than usual "aches and pains".   PHYSICAL EXAM BP 120/70  Pulse 64  Resp 20  Ht 5\' 9"  (1.753 m)  Wt 168 lb 12.8 oz (76.567 kg)  BMI 24.92 kg/m2  General: Alert, oriented x3, no distress Head: no evidence of trauma, PERRL, EOMI, no exophtalmos or lid lag, no myxedema, no xanthelasma; normal ears, nose and oropharynx Neck: normal jugular venous pulsations and no hepatojugular reflux; brisk carotid pulses without delay and no carotid bruits Chest: clear to auscultation, no signs of consolidation by percussion or palpation, normal fremitus, symmetrical and full respiratory excursions Cardiovascular: normal position and quality of the apical impulse, regular rhythm, normal first and second heart sounds, no murmurs, rubs or gallops Abdomen: no tenderness or distention, no masses by palpation, no abnormal pulsatility or arterial bruits, normal bowel sounds, no hepatosplenomegaly Extremities: no clubbing, cyanosis or edema; 2+ radial, ulnar and brachial pulses bilaterally; 2+ right femoral, posterior tibial and dorsalis pedis pulses; 2+ left femoral, posterior tibial and dorsalis pedis pulses; no subclavian or femoral bruits Neurological: grossly nonfocal Left subclavian pacemaker site  EKG: Normal sinus rhythm  Lipid Panel  September 2013 total cholesterol 95, triglycerides 64, HDL cholesterol 39, LDL cholesterol 43     Component Value Date/Time   CHOL 169 02/28/2012 0857   TRIG  104 02/28/2012 0857   HDL 41 02/28/2012 0857   CHOLHDL 4.1 02/28/2012 0857   VLDL 21 02/28/2012 0857   LDLCALC 107* 02/28/2012 0857    BMET    Component Value Date/Time   NA 139 03/02/2012 0426   K 3.4* 03/02/2012 0426   CL 108 03/02/2012 0426   CO2 22 03/02/2012 0426   GLUCOSE 119* 03/02/2012 0426   BUN 13 03/02/2012 0426   CREATININE 0.99 03/02/2012 0426   CALCIUM 8.1* 03/02/2012 0426   GFRNONAA 79* 03/02/2012 0426   GFRAA >90 03/02/2012 0426     ASSESSMENT AND PLAN HTN (hypertension) Well-controlled  Dyslipidemia, statin added Excellent values per most recent lipid profile  CAD (coronary artery disease) Anterior wall ST segment elevation myocardial infarction April  2013 complicated by second-degree atrioventricular block. Treated with placement of 2.5 x 38 and 2.75 x 24 consecutive drug-eluting stents Subsequent drug-eluting stent to the right coronary artery (3 x 16 mm promus) and oblique marginal 1 (2.5 x 12 mm promus). Preserved left ventricular systolic function 64% by nuclear stress test September 2013 with fixed distal anterior and apical defect by perfusion imaging. Angina free/asymptomatic at this time.  Pacemaker Dual-chamber St. Jude accent with normal function by recent merlin download on April 26. 20% atrial pacing, 3.6% ventricular pacing. One episode of mode switch lasting for roughly 7 minutes is consistent with a organized atrial tachycardia with one-to-one AV conduction no convincing atrial fibrillation is seen.     Meds ordered this encounter  Medications  . DISCONTD: ramipril (ALTACE) 2.5 MG capsule    Sig: Take 2 capsules (5 mg total) by mouth daily.    Dispense:  90 capsule    Refill:  3  . DISCONTD: ramipril (ALTACE) 5 MG capsule    Sig: Take 1 capsule (5 mg total) by mouth daily.    Dispense:  90 capsule    Refill:  3  . ramipril (ALTACE) 5 MG capsule    Sig: Take 1 capsule (5 mg total) by mouth daily.    Dispense:  90 capsule    Refill:  3     Anuoluwapo Mefferd  Thurmon Fair, MD, [facility] [phone] office [phone] pager

## 2013-04-23 NOTE — Assessment & Plan Note (Signed)
Well controlled 

## 2013-04-30 ENCOUNTER — Telehealth: Payer: Self-pay | Admitting: Cardiovascular Disease

## 2013-04-30 NOTE — Telephone Encounter (Signed)
Need clarification on the dosage on his Lisinipiril and Metropolol!

## 2013-04-30 NOTE — Telephone Encounter (Signed)
Returned call and spoke w/ Harriett Sine, pt's wife.  Stated when pt took handwritten Rxs to [facility] pharmacy, they gave him metoprolol tartrate 50mg  BID instead of metoprolol succinate 25mg  1/2 daily and lisinopril instead of ramipril.  Wife wanted to know if it is ok for pt to take them this way.  Informed RN will notify Dr. Tresa Endo for further instructions as pharmacy should have contacted office prior to changing meds.  Verbalized understanding and agreed w/ plan.  Will await call back.  Message forwarded to Dr. Tresa Endo.  This note w/ paper chart# [id number] on Dr. Landry Dyke cart for review.  Pt will run out of metoprolol succ tomorrow.

## 2013-05-01 NOTE — Telephone Encounter (Signed)
Wanted you to know her home phone is not working-Please call her on her cell [phone]!

## 2013-05-02 ENCOUNTER — Telehealth: Payer: Self-pay | Admitting: Cardiovascular Disease

## 2013-05-02 NOTE — Telephone Encounter (Signed)
Mrs Lonnie Marshall to say that her telephones were mess up and to please give her a call today . The only  Problem is that she does not have a answering machine until she gets a new phon e..   Thanks

## 2013-05-02 NOTE — Telephone Encounter (Signed)
Burna Mortimer already spoke w/ pt.

## 2013-05-02 NOTE — Progress Notes (Signed)
Quick Note:  S/W wife informing her of patients lab results. Okay to take plavix if no bleeding issues.She states patient is okay.Marland KitchenMarland KitchenMarland KitchenNo bleeding issues. ______

## 2013-05-03 NOTE — Telephone Encounter (Signed)
i spoke with wife confirmed the medications that was given to her by the [place]  is equivalent to the doses of medications Dr. Tresa Endo gave to them. The metoprolol was changed from short acting to long acting.

## 2013-05-30 ENCOUNTER — Telehealth: Payer: Self-pay | Admitting: Cardiovascular Disease

## 2013-05-30 NOTE — Telephone Encounter (Signed)
Lonnie Marshall for surgery-Having a TUR-Can he stop his aspirin and Plavix?Please let them know asap.

## 2013-05-30 NOTE — Telephone Encounter (Signed)
Message forwarded to Dr. Tresa Endo.  This note placed on Dr. Landry Dyke cart.

## 2013-06-06 NOTE — Telephone Encounter (Signed)
Still waiting to find out if he can stop his Plavix and aspirin?m

## 2013-06-07 ENCOUNTER — Telehealth: Payer: Self-pay | Admitting: Cardiovascular Disease

## 2013-06-07 NOTE — Telephone Encounter (Signed)
Waiting to reecive clarence on this pt.

## 2013-06-08 NOTE — Telephone Encounter (Signed)
Dr. Tresa Endo has to address this question, it has already been forwarded to him by Beecher Mcardle triage RN.

## 2013-06-08 NOTE — Telephone Encounter (Signed)
Message already forwarded to Dr. Tresa Endo.

## 2013-06-12 NOTE — Telephone Encounter (Signed)
Returned call.  Pam informed Dr. Tresa Endo has received the request and it will be later today or tomorrow morning before a response is given.  Verbalized understanding.

## 2013-06-12 NOTE — Telephone Encounter (Signed)
Pam was calling from Dr. Estil Daft office to check on the status of Mr. Pfenning clearance.

## 2013-06-14 ENCOUNTER — Other Ambulatory Visit: Payer: Self-pay | Admitting: Urology

## 2013-06-14 NOTE — Telephone Encounter (Signed)
Spoke with  patient's wife informing her that the surgical clearance has been sent to [facility].

## 2013-06-14 NOTE — Telephone Encounter (Signed)
Patient has been informed that Dr.Kelly has signed the form, and it has been sent back to the requesting MD.

## 2013-06-29 ENCOUNTER — Encounter (HOSPITAL_COMMUNITY): Payer: Self-pay | Admitting: Pharmacy Technician

## 2013-07-04 NOTE — Progress Notes (Signed)
Perioperative prescription for implanted cardiac device on chart, EKG 03/29/13 on EPIC

## 2013-07-04 NOTE — Patient Instructions (Addendum)
20 Kenden Brandt  07/04/2013   Your procedure is scheduled on: 07/13/13  Report to [facility] at 5:30 AM.  Call this number if you have problems the morning of surgery [phone]: [phone]   Remember:   Do not eat food or drink liquids After Midnight.Metoprolol, Isosorbide     Take these medicines the morning of surgery with A SIP OF WATER:     Do not wear jewelry, make-up or nail polish.  Do not wear lotions, powders, or perfumes. You may wear deodorant.  Do not shave 48 hours prior to surgery. Men may shave face and neck.  Do not bring valuables to the hospital.  Contacts, dentures or bridgework may not be worn into surgery.  Leave suitcase in the car. After surgery it may be brought to your room.  For patients admitted to the hospital, checkout time is 11:00 AM the day of discharge.  Merleen Nicely , RN  pre op nurse call if needed [phone]    FAILURE TO FOLLOW THESE INSTRUCTIONS MAY RESULT IN CANCELLATION OF YOUR SURGERY   Patient Signature: ___________________________________________

## 2013-07-05 ENCOUNTER — Ambulatory Visit (HOSPITAL_COMMUNITY)
Admission: RE | Admit: 2013-07-05 | Discharge: 2013-07-05 | Disposition: A | Discharge status: Home / Self Care | Payer: Medicare Other | Source: Ambulatory Visit | Attending: Urology | Admitting: Urology

## 2013-07-05 ENCOUNTER — Encounter (HOSPITAL_COMMUNITY): Payer: Self-pay

## 2013-07-05 ENCOUNTER — Encounter (HOSPITAL_COMMUNITY)
Admission: RE | Admit: 2013-07-05 | Discharge: 2013-07-05 | Disposition: A | Discharge status: Home / Self Care | Payer: Medicare Other | Source: Ambulatory Visit | Attending: Urology | Admitting: Urology

## 2013-07-05 DIAGNOSIS — N1330 Unspecified hydronephrosis: Secondary | ICD-10-CM | POA: Insufficient documentation

## 2013-07-05 DIAGNOSIS — I10 Essential (primary) hypertension: Secondary | ICD-10-CM | POA: Insufficient documentation

## 2013-07-05 DIAGNOSIS — Z01812 Encounter for preprocedural laboratory examination: Secondary | ICD-10-CM | POA: Insufficient documentation

## 2013-07-05 DIAGNOSIS — Z01818 Encounter for other preprocedural examination: Secondary | ICD-10-CM | POA: Insufficient documentation

## 2013-07-05 DIAGNOSIS — Z950 Presence of cardiac pacemaker: Secondary | ICD-10-CM | POA: Insufficient documentation

## 2013-07-05 LAB — URINALYSIS, ROUTINE W REFLEX MICROSCOPIC
Bilirubin Urine: NEGATIVE
Glucose, UA: 500 mg/dL — AB
Hgb urine dipstick: NEGATIVE
Specific Gravity, Urine: 1.026 (ref 1.005–1.030)
Urobilinogen, UA: 0.2 mg/dL (ref 0.0–1.0)

## 2013-07-05 LAB — CBC
HCT: 41.4 % (ref 39.0–52.0)
Hemoglobin: 14.2 g/dL (ref 13.0–17.0)
MCH: 31.2 pg (ref 26.0–34.0)
MCV: 91 fL (ref 78.0–100.0)
RBC: 4.55 MIL/uL (ref 4.22–5.81)

## 2013-07-05 LAB — BASIC METABOLIC PANEL
BUN: 16 mg/dL (ref 6–23)
CO2: 27 mEq/L (ref 19–32)
Calcium: 8.9 mg/dL (ref 8.4–10.5)
Creatinine, Ser: 1.06 mg/dL (ref 0.50–1.35)
Glucose, Bld: 169 mg/dL — ABNORMAL HIGH (ref 70–99)

## 2013-07-06 LAB — URINE CULTURE: Colony Count: NO GROWTH

## 2013-07-12 NOTE — H&P (Signed)
History of Present Illness                                F/u MH, left hydro, BPH/inc emptying. Referred by Dr. Marinda Elk May 2014.   MH/left hydro -April 2014 - poss stone passage (""half a piece of rice" in the toilet), then developed TNTC rbc, dysuria, frequency and nocturia (after stone passage).  Tried oxybutynin in past, Dr. Foy Guadalajara added tamsulosin, symptoms improved.  Quit smoking in 1976 after smoking for 20 yrs. He has not had chemo or XRT. He has no chemical exposure.  -June 2014 - CT a/p - large prostate with an 8 cm median lobe (135 g estimate), J-hook ureters, left hydronephrosis with possible enhancing wall/lesion of proximal tortuosity  Cytology atypical -Jul 2014 - Cystoscopy - BPH with large anterior lobe; recurrent UTI (dysuria, freq/urge), poor emptying, left hydronephrosis  Cx coag neg staph Renal US - left hydronephrosis, right kidney normal, PVR 417 , see tech sheet for details, I reviewed all images.  Plan cystoscopy, left URS poss biopsy, poss stent, TURP. Need clearance and permission to stop ASA, Plavix.   BPH/Inc emptying: -May 2014 DRE normal, PSA 1.3, BUN 22, creatinine 1.13 -June 2014 CT 135 g prostate Start finasteride -Jul 2014 pvr 417 cc  He's on Plavix.   Today patient is seen in management of the above. He is voiding with a better stream and less frequency and urgency. His urinalysis is clear. We were preparing for cystoscopy possible TURP, left ureteroscopy and I considered Urodynamics to eval bladder for BOO and to eval for left VUR. Pt reported better voiding and wanted to f/u prior to above procedure to determine if an outlet procedure is a good option.  Renal US, left today - comparison ultrasound July 2014 and CT June 2014 - findings: The left kidney continues to show moderate to severe hydroureteronephrosis, there is a stable left renal cyst, there is no obvious mass and the bladder contains a post void of 405 mL but this is somewhat lower as  the prostate lobe was measured. There continues to be seen a large prostate nodule.   Past Medical History Problems  1. History of  Acute Myocardial Infarction V12.59 2. History of  Hypercholesterolemia 272.0 3. History of  Nephrolithiasis V13.01  Surgical History Problems  1. History of  Cath Placement Of Stent 1 2. History of  Elective Circumcision V50.2 3. History of  Surgery Of Male Genitalia Vasectomy V25.2  Current Meds 1. Aspirin 81 MG Oral Tablet; Therapy: (Recorded:29May2014) to 2. Atorvastatin Calcium 80 MG Oral Tablet; Therapy: (Recorded:29May2014) to 3. Brilinta 90 MG Oral Tablet; Therapy: (Recorded:29May2014) to 4. Ciprofloxacin HCl 500 MG Oral Tablet; Take 1 tablet twice daily; Therapy: 22Jul2014 to  (Evaluate:01Aug2014)  Requested for: 22Jul2014; Last Rx:22Jul2014 5. Docusate Sodium POWD; Therapy: (Recorded:29May2014) to 6. Doxycycline Hyclate 100 MG Oral Capsule; TAKE 1 CAPSULE TWICE DAILY UNTIL GONE;  Therapy: 28Jul2014 to (Evaluate:04Aug2014)  Requested for: 28Jul2014; Last Rx:28Jul2014 7. Fish Oil CAPS; Therapy: (Recorded:29May2014) to 8. Isosorbide Mononitrate TABS; Therapy: (Recorded:29May2014) to 9. Ramipril 2.5 MG Oral Capsule; Therapy: (Recorded:29May2014) to 10. Tamsulosin HCl 0.4 MG Oral Capsule; Therapy: (Recorded:29May2014) to 11. Vitamin D-3 TABS; Therapy: (Recorded:29May2014) to  Allergies Medication  1. No Known Drug Allergies  Family History Problems  1. Paternal history of  Acute Myocardial Infarction V17.3 2. Family history of  Death In The Family Father Died age 35-Heart attack 3. Family history  of  Death In The Family Mother Died age 62-Stroke 4. Family history of  Family Health Status Number Of Children 2 girls 5. Paternal history of  Heart Disease V17.49 6. Maternal history of  Stroke Syndrome V17.1  Social History Problems  1. Marital History - Currently Married 2. Occupation: Retired 3. Tobacco Use 305.1 Chews tobacco Denied   4. History of  Alcohol Use 5. History of  Caffeine Use  Review of Systems Constitutional, cardiovascular, pulmonary, gastrointestinal and neurological system(s) were reviewed and pertinent findings if present are noted.    Vitals Vital Signs [Data Includes: Last 1 Day]  25Aug2014 09:07AM  Blood Pressure: 163 / 76 Temperature: 97.4 F Heart Rate: 59  Physical Exam Constitutional: Well nourished and well developed . No acute distress.  Pulmonary: No respiratory distress and normal respiratory rhythm and effort.  Cardiovascular: Heart rate and rhythm are normal . No peripheral edema.  Skin: Normal skin turgor, no visible rash and no visible skin lesions.    Results/Data  Urine [Data Includes: Last 1 Day]   25Aug2014  COLOR YELLOW   APPEARANCE CLEAR   SPECIFIC GRAVITY 1.020   pH 5.5   GLUCOSE 250 mg/dL  BILIRUBIN NEG   KETONE NEG mg/dL  BLOOD NEG   PROTEIN NEG mg/dL  UROBILINOGEN 0.2 mg/dL  NITRITE NEG   LEUKOCYTE ESTERASE NEG    02 Jul 2013 7:57 AM   UA With REFLEX       COLOR YELLOW       APPEARANCE CLEAR       SPECIFIC GRAVITY 1.020       pH 5.5       GLUCOSE 250       BILIRUBIN NEG       KETONE NEG       BLOOD NEG       PROTEIN NEG       UROBILINOGEN 0.2       NITRITE NEG       LEUKOCYTE ESTERASE NEG    Assessment Assessed  1. Benign Localized Prostatic Hyperplasia Without Urinary Obstruction With Other Lower Urinary  Tract Symptoms 600.20 2. Hydronephrosis 591 3. Incomplete Emptying Of Bladder 788.21  Plan  Incomplete Emptying Of Bladder (788.21)  1. URODYNAMICS  Requested for: 25Aug2014  UA With REFLEX  Status: Complete  Done: 01Jan0001 12:00AM Ordered Today; For: Health Maintenance (V70.0); Ordered By: Jerilee Field  Due: 27Aug2014 Marked Important   Discussion/Summary        Discussed his improved symptoms are likely from UTI clearing, but underlying factors are still present  -- > discussed the renal ultrasound and reviewed the images with  the patient (left hydro, inc emptying) and the rationale for urodynamics to evaluate the bladder function, bladder outlet obstruction and to determine if he has left vesicoureteral reflux. Pt with large median lobe, recurrent UTI, poor emptying, left hydronephrosis.  I recommend we proceed with urodynamics prior to the OR. At the least in the OR he needs cystoscopy with left ureteroscopy to ensure the area in the left upper tract is benign with possible stent placement we discussed these procedures. Depending on UDS we also discussed adding an outlet procedure and he may recover more with the greenlight vaporization. We discussed again the nature of the procedures and the flow symptoms didn't improve as well as bladder emptying with frequency and urgency and persist or worsen in some cases. Also there is a risk of incontinence and strictures after these procedures among others.  All questions answered. Will need clearance and permission to stop ASA, Plavix. He has pre-op appt later in the week.   Urine is clear.    Signatures Electronically signed by : Jerilee Field, M.D.; Jul 02 2013  9:32AM  UDS showed large capacity (700cc) equivocal for BOO and no VUR.

## 2013-07-13 ENCOUNTER — Encounter (HOSPITAL_COMMUNITY): Payer: Self-pay | Admitting: *Deleted

## 2013-07-13 ENCOUNTER — Encounter (HOSPITAL_COMMUNITY)
Admission: RE | Disposition: A | Discharge status: Home / Self Care | Payer: Self-pay | Source: Ambulatory Visit | Attending: Urology

## 2013-07-13 ENCOUNTER — Ambulatory Visit (HOSPITAL_COMMUNITY): Payer: Medicare Other | Admitting: Anesthesiology

## 2013-07-13 ENCOUNTER — Observation Stay (HOSPITAL_COMMUNITY)
Admission: RE | Admit: 2013-07-13 | Discharge: 2013-07-14 | Disposition: A | Discharge status: Home / Self Care | Payer: Medicare Other | Source: Ambulatory Visit | Attending: Urology | Admitting: Urology

## 2013-07-13 ENCOUNTER — Encounter (HOSPITAL_COMMUNITY): Payer: Self-pay | Admitting: Anesthesiology

## 2013-07-13 DIAGNOSIS — G4730 Sleep apnea, unspecified: Secondary | ICD-10-CM | POA: Insufficient documentation

## 2013-07-13 DIAGNOSIS — N138 Other obstructive and reflux uropathy: Principal | ICD-10-CM | POA: Insufficient documentation

## 2013-07-13 DIAGNOSIS — Z79899 Other long term (current) drug therapy: Secondary | ICD-10-CM | POA: Insufficient documentation

## 2013-07-13 DIAGNOSIS — N139 Obstructive and reflux uropathy, unspecified: Secondary | ICD-10-CM | POA: Insufficient documentation

## 2013-07-13 DIAGNOSIS — I252 Old myocardial infarction: Secondary | ICD-10-CM | POA: Insufficient documentation

## 2013-07-13 DIAGNOSIS — Z7982 Long term (current) use of aspirin: Secondary | ICD-10-CM | POA: Insufficient documentation

## 2013-07-13 DIAGNOSIS — N1330 Unspecified hydronephrosis: Secondary | ICD-10-CM | POA: Insufficient documentation

## 2013-07-13 DIAGNOSIS — I4430 Unspecified atrioventricular block: Secondary | ICD-10-CM | POA: Insufficient documentation

## 2013-07-13 DIAGNOSIS — N401 Enlarged prostate with lower urinary tract symptoms: Secondary | ICD-10-CM | POA: Diagnosis present

## 2013-07-13 DIAGNOSIS — Z950 Presence of cardiac pacemaker: Secondary | ICD-10-CM | POA: Insufficient documentation

## 2013-07-13 DIAGNOSIS — I10 Essential (primary) hypertension: Secondary | ICD-10-CM | POA: Insufficient documentation

## 2013-07-13 DIAGNOSIS — I2510 Atherosclerotic heart disease of native coronary artery without angina pectoris: Secondary | ICD-10-CM | POA: Insufficient documentation

## 2013-07-13 DIAGNOSIS — E119 Type 2 diabetes mellitus without complications: Secondary | ICD-10-CM | POA: Insufficient documentation

## 2013-07-13 DIAGNOSIS — N135 Crossing vessel and stricture of ureter without hydronephrosis: Secondary | ICD-10-CM | POA: Diagnosis present

## 2013-07-13 DIAGNOSIS — N400 Enlarged prostate without lower urinary tract symptoms: Secondary | ICD-10-CM

## 2013-07-13 HISTORY — PX: TRANSURETHRAL RESECTION OF PROSTATE: SHX73

## 2013-07-13 HISTORY — PX: CYSTOSCOPY WITH RETROGRADE PYELOGRAM, URETEROSCOPY AND STENT PLACEMENT: SHX5789

## 2013-07-13 HISTORY — DX: Benign prostatic hyperplasia with lower urinary tract symptoms: N40.1

## 2013-07-13 HISTORY — DX: Crossing vessel and stricture of ureter without hydronephrosis: N13.5

## 2013-07-13 HISTORY — DX: Other obstructive and reflux uropathy: N13.8

## 2013-07-13 LAB — GLUCOSE, CAPILLARY: Glucose-Capillary: 158 mg/dL — ABNORMAL HIGH (ref 70–99)

## 2013-07-13 SURGERY — CYSTOURETEROSCOPY, WITH RETROGRADE PYELOGRAM AND STENT INSERTION
Anesthesia: General | Wound class: Clean Contaminated

## 2013-07-13 MED ORDER — CLOPIDOGREL BISULFATE 75 MG PO TABS: 75.0000 mg | ORAL_TABLET | Freq: Every morning | ORAL | Status: AC

## 2013-07-13 MED ORDER — FENTANYL CITRATE 0.05 MG/ML IJ SOLN
INTRAMUSCULAR | Status: DC | PRN
  Administered 2013-07-13: 50 ug via INTRAVENOUS
  Administered 2013-07-13 (×3): 25 ug via INTRAVENOUS

## 2013-07-13 MED ORDER — ISOSORBIDE MONONITRATE 15 MG HALF TABLET
15.0000 mg | ORAL_TABLET | Freq: Every day | ORAL | Status: DC
  Administered 2013-07-14: 09:00:00 15 mg via ORAL
  Filled 2013-07-13: qty 1

## 2013-07-13 MED ORDER — PROMETHAZINE HCL 25 MG/ML IJ SOLN: 6.2500 mg | INTRAMUSCULAR | Status: DC | PRN

## 2013-07-13 MED ORDER — SODIUM CHLORIDE 0.9 % IR SOLN
Status: DC | PRN
  Administered 2013-07-13: 22000 mL

## 2013-07-13 MED ORDER — PROPOFOL 10 MG/ML IV BOLUS
INTRAVENOUS | Status: DC | PRN
  Administered 2013-07-13: 150 mg via INTRAVENOUS

## 2013-07-13 MED ORDER — ASPIRIN EC 81 MG PO TBEC: 81.0000 mg | DELAYED_RELEASE_TABLET | Freq: Every morning | ORAL | Status: AC

## 2013-07-13 MED ORDER — FENTANYL CITRATE 0.05 MG/ML IJ SOLN
INTRAMUSCULAR | Status: AC
  Filled 2013-07-13: qty 2

## 2013-07-13 MED ORDER — HYOSCYAMINE SULFATE 0.125 MG SL SUBL
0.1250 mg | SUBLINGUAL_TABLET | SUBLINGUAL | Status: DC | PRN
  Administered 2013-07-13: 14:00:00 0.125 mg via ORAL
  Filled 2013-07-13: qty 1

## 2013-07-13 MED ORDER — DEXAMETHASONE SODIUM PHOSPHATE 10 MG/ML IJ SOLN
INTRAMUSCULAR | Status: DC | PRN
  Administered 2013-07-13: 10 mg via INTRAVENOUS

## 2013-07-13 MED ORDER — VANCOMYCIN HCL IN DEXTROSE 1-5 GM/200ML-% IV SOLN
1000.0000 mg | Freq: Once | INTRAVENOUS | Status: AC
  Administered 2013-07-13: 1000 mg via INTRAVENOUS

## 2013-07-13 MED ORDER — PHENYLEPHRINE HCL 10 MG/ML IJ SOLN
INTRAMUSCULAR | Status: DC | PRN
  Administered 2013-07-13 (×3): 80 ug via INTRAVENOUS

## 2013-07-13 MED ORDER — BELLADONNA ALKALOIDS-OPIUM 16.2-60 MG RE SUPP
RECTAL | Status: AC
  Filled 2013-07-13: qty 1

## 2013-07-13 MED ORDER — ONDANSETRON HCL 4 MG/2ML IJ SOLN
INTRAMUSCULAR | Status: DC | PRN
  Administered 2013-07-13: 4 mg via INTRAVENOUS

## 2013-07-13 MED ORDER — BACITRACIN-NEOMYCIN-POLYMYXIN 400-5-5000 EX OINT: 1.0000 "application " | TOPICAL_OINTMENT | Freq: Three times a day (TID) | CUTANEOUS | Status: DC | PRN

## 2013-07-13 MED ORDER — CEFAZOLIN SODIUM-DEXTROSE 2-3 GM-% IV SOLR
2.0000 g | INTRAVENOUS | Status: AC
  Administered 2013-07-13: 2 g via INTRAVENOUS

## 2013-07-13 MED ORDER — LIDOCAINE HCL 2 % EX GEL
CUTANEOUS | Status: DC | PRN
  Administered 2013-07-13: 1 via URETHRAL

## 2013-07-13 MED ORDER — METOPROLOL TARTRATE 25 MG PO TABS
25.0000 mg | ORAL_TABLET | Freq: Two times a day (BID) | ORAL | Status: DC
  Administered 2013-07-13 – 2013-07-14 (×2): 25 mg via ORAL
  Filled 2013-07-13 (×3): qty 1

## 2013-07-13 MED ORDER — FENTANYL CITRATE 0.05 MG/ML IJ SOLN
25.0000 ug | INTRAMUSCULAR | Status: DC | PRN
  Administered 2013-07-13 (×3): 12.5 ug via INTRAVENOUS

## 2013-07-13 MED ORDER — FINASTERIDE 5 MG PO TABS
5.0000 mg | ORAL_TABLET | Freq: Every day | ORAL | Status: DC
  Administered 2013-07-13 – 2013-07-14 (×2): 5 mg via ORAL
  Filled 2013-07-13 (×2): qty 1

## 2013-07-13 MED ORDER — DIPHENHYDRAMINE HCL 12.5 MG/5ML PO ELIX: 12.5000 mg | ORAL_SOLUTION | Freq: Four times a day (QID) | ORAL | Status: DC | PRN

## 2013-07-13 MED ORDER — ONDANSETRON HCL 4 MG/2ML IJ SOLN
4.0000 mg | INTRAMUSCULAR | Status: DC | PRN
  Administered 2013-07-13 – 2013-07-14 (×2): 4 mg via INTRAVENOUS
  Filled 2013-07-13 (×2): qty 2

## 2013-07-13 MED ORDER — LISINOPRIL 5 MG PO TABS
5.0000 mg | ORAL_TABLET | Freq: Every morning | ORAL | Status: DC
  Administered 2013-07-13 – 2013-07-14 (×2): 5 mg via ORAL
  Filled 2013-07-13 (×2): qty 1

## 2013-07-13 MED ORDER — DIATRIZOATE MEGLUMINE 30 % UR SOLN
URETHRAL | Status: DC | PRN
  Administered 2013-07-13: 300 mL

## 2013-07-13 MED ORDER — HYDROMORPHONE HCL PF 1 MG/ML IJ SOLN: 0.5000 mg | INTRAMUSCULAR | Status: DC | PRN

## 2013-07-13 MED ORDER — ATORVASTATIN CALCIUM 40 MG PO TABS
40.0000 mg | ORAL_TABLET | Freq: Every day | ORAL | Status: DC
  Administered 2013-07-13: 18:00:00 40 mg via ORAL
  Filled 2013-07-13 (×2): qty 1

## 2013-07-13 MED ORDER — LIDOCAINE HCL (CARDIAC) 20 MG/ML IV SOLN
INTRAVENOUS | Status: DC | PRN
  Administered 2013-07-13: 100 mg via INTRAVENOUS

## 2013-07-13 MED ORDER — SODIUM CHLORIDE 0.9 % IV SOLN
10.0000 mg | INTRAVENOUS | Status: DC | PRN
  Administered 2013-07-13: 20 ug/min via INTRAVENOUS

## 2013-07-13 MED ORDER — LACTATED RINGERS IV SOLN
INTRAVENOUS | Status: DC | PRN
  Administered 2013-07-13 (×2): via INTRAVENOUS

## 2013-07-13 MED ORDER — LIDOCAINE HCL 2 % EX GEL
CUTANEOUS | Status: AC
  Filled 2013-07-13: qty 10

## 2013-07-13 MED ORDER — ZOLPIDEM TARTRATE 5 MG PO TABS: 5.0000 mg | ORAL_TABLET | Freq: Every evening | ORAL | Status: DC | PRN

## 2013-07-13 MED ORDER — DIPHENHYDRAMINE HCL 50 MG/ML IJ SOLN: 12.5000 mg | Freq: Four times a day (QID) | INTRAMUSCULAR | Status: DC | PRN

## 2013-07-13 MED ORDER — MIDAZOLAM HCL 5 MG/5ML IJ SOLN
INTRAMUSCULAR | Status: DC | PRN
  Administered 2013-07-13: 2 mg via INTRAVENOUS

## 2013-07-13 MED ORDER — OXYCODONE HCL 5 MG PO TABS
5.0000 mg | ORAL_TABLET | ORAL | Status: DC | PRN
  Administered 2013-07-13: 5 mg via ORAL
  Filled 2013-07-13: qty 1

## 2013-07-13 MED ORDER — SODIUM CHLORIDE 0.9 % IV SOLN
INTRAVENOUS | Status: DC
  Administered 2013-07-13 – 2013-07-14 (×3): via INTRAVENOUS

## 2013-07-13 MED ORDER — CIPROFLOXACIN HCL 500 MG PO TABS: 500.0000 mg | ORAL_TABLET | Freq: Two times a day (BID) | ORAL | Status: DC

## 2013-07-13 MED ORDER — IOHEXOL 350 MG/ML SOLN
INTRAVENOUS | Status: DC | PRN
  Administered 2013-07-13: 25 mL via URETHRAL

## 2013-07-13 MED ORDER — CEFAZOLIN SODIUM-DEXTROSE 2-3 GM-% IV SOLR
INTRAVENOUS | Status: AC
  Filled 2013-07-13: qty 50

## 2013-07-13 MED ORDER — DOCUSATE SODIUM 100 MG PO CAPS
100.0000 mg | ORAL_CAPSULE | Freq: Two times a day (BID) | ORAL | Status: DC
  Administered 2013-07-13 – 2013-07-14 (×2): 100 mg via ORAL
  Filled 2013-07-13 (×3): qty 1

## 2013-07-13 MED ORDER — SODIUM CHLORIDE 0.9 % IR SOLN
3000.0000 mL | Status: DC
  Administered 2013-07-13 (×2): 3000 mL

## 2013-07-13 MED ORDER — DOCUSATE SODIUM 100 MG PO CAPS: 100.0000 mg | ORAL_CAPSULE | Freq: Two times a day (BID) | ORAL | Status: DC

## 2013-07-13 MED ORDER — CEFAZOLIN SODIUM 1-5 GM-% IV SOLN
1.0000 g | Freq: Three times a day (TID) | INTRAVENOUS | Status: AC
  Administered 2013-07-13 – 2013-07-14 (×2): 1 g via INTRAVENOUS
  Filled 2013-07-13 (×2): qty 50

## 2013-07-13 MED ORDER — ACETAMINOPHEN 325 MG PO TABS: 650.0000 mg | ORAL_TABLET | ORAL | Status: DC | PRN

## 2013-07-13 MED ORDER — OXYCODONE-ACETAMINOPHEN 5-325 MG PO TABS: 1.0000 | ORAL_TABLET | ORAL | Status: DC | PRN

## 2013-07-13 MED ORDER — VANCOMYCIN HCL IN DEXTROSE 1-5 GM/200ML-% IV SOLN
INTRAVENOUS | Status: AC
  Filled 2013-07-13: qty 200

## 2013-07-13 SURGICAL SUPPLY — 48 items
BAG DRAIN URO-CYSTO SKYTR STRL (DRAIN) ×2 IMPLANT
BAG DRN UROCATH (DRAIN)
BAG URINE DRAINAGE (UROLOGICAL SUPPLIES) ×3 IMPLANT
BAG URO CATCHER STRL LF (DRAPE) ×3 IMPLANT
BASKET LASER NITINOL 1.9FR (BASKET) IMPLANT
BASKET STNLS GEMINI 4WIRE 3FR (BASKET) IMPLANT
BASKET ZERO TIP NITINOL 2.4FR (BASKET) IMPLANT
BRUSH URET BIOPSY 3F (UROLOGICAL SUPPLIES) IMPLANT
BSKT STON RTRVL 120 1.9FR (BASKET)
BSKT STON RTRVL GEM 120X11 3FR (BASKET)
BSKT STON RTRVL ZERO TP 2.4FR (BASKET)
CANISTER SUCT LVC 12 LTR MEDI- (MISCELLANEOUS) IMPLANT
CATH HEMATURIA 20FR (CATHETERS) ×1 IMPLANT
CATH INTERMIT  6FR 70CM (CATHETERS) ×1 IMPLANT
CATH URET 5FR 28IN CONE TIP (BALLOONS)
CATH URET 5FR 28IN OPEN ENDED (CATHETERS) IMPLANT
CATH URET 5FR 70CM CONE TIP (BALLOONS) IMPLANT
CATH URET DUAL LUMEN 6-10FR 50 (CATHETERS) ×1 IMPLANT
CLOTH BEACON ORANGE TIMEOUT ST (SAFETY) ×3 IMPLANT
DRAPE CAMERA CLOSED 9X96 (DRAPES) ×3 IMPLANT
ELECT BUTTON HF 24-28F 2 30DE (ELECTRODE) ×1 IMPLANT
ELECT LOOP MED HF 24F 12D CBL (CLIP) ×1 IMPLANT
ELECT REM PT RETURN 9FT ADLT (ELECTROSURGICAL)
ELECTRODE REM PT RTRN 9FT ADLT (ELECTROSURGICAL) ×2 IMPLANT
EVACUATOR MICROVAS BLADDER (UROLOGICAL SUPPLIES) ×3 IMPLANT
GLOVE BIO SURGEON STRL SZ7.5 (GLOVE) ×7 IMPLANT
GLOVE BIOGEL M STRL SZ7.5 (GLOVE) ×3 IMPLANT
GOWN PREVENTION PLUS LG XLONG (DISPOSABLE) ×3 IMPLANT
GOWN STRL REIN XL XLG (GOWN DISPOSABLE) ×4 IMPLANT
GUIDEWIRE 0.038 PTFE COATED (WIRE) ×2 IMPLANT
GUIDEWIRE ANG ZIPWIRE 038X150 (WIRE) ×1 IMPLANT
GUIDEWIRE STR DUAL SENSOR (WIRE) ×2 IMPLANT
HOLDER FOLEY CATH W/STRAP (MISCELLANEOUS) ×1 IMPLANT
IV NS IRRIG 3000ML ARTHROMATIC (IV SOLUTION) ×5 IMPLANT
KIT BALLIN UROMAX 15FX10 (LABEL) IMPLANT
KIT BALLN UROMAX 15FX4 (MISCELLANEOUS) IMPLANT
KIT BALLN UROMAX 26 75X4 (MISCELLANEOUS)
MANIFOLD NEPTUNE II (INSTRUMENTS) ×3 IMPLANT
PACK CYSTO (CUSTOM PROCEDURE TRAY) ×3 IMPLANT
PACK CYSTOSCOPY (CUSTOM PROCEDURE TRAY) ×3 IMPLANT
SET HIGH PRES BAL DIL (LABEL)
SHEATH URET ACCESS 12FR/35CM (UROLOGICAL SUPPLIES) IMPLANT
SHEATH URET ACCESS 12FR/55CM (UROLOGICAL SUPPLIES) IMPLANT
STENT CONTOUR 6FRX26X.038 (STENTS) ×1 IMPLANT
STENT RETRO 6FR PIC/PGM (STENTS) IMPLANT
SYR 30ML LL (SYRINGE) ×1 IMPLANT
SYRINGE IRR TOOMEY STRL 70CC (SYRINGE) ×3 IMPLANT
TUBING CONNECTING 10 (TUBING) ×3 IMPLANT

## 2013-07-13 NOTE — Op Note (Signed)
Preoperative diagnosis: BPH, incomplete bladder emptying, left hydroureteronephrosis Postop diagnosis: BPH, bladder outlet obstruction, incomplete bladder emptying, left hydroureteronephrosis  Procedure: Exam under anesthesia Cystoscopy Left retrograde pyelogram Left ureteroscopy Left ureteral stent placement Transurethral resection and vaporization of the prostate Foley catheter placement Cystogram  Surgeon: Mena Goes Anesthesia:Denenny  Type of anesthesia: Gen.  Findings: On exam under anesthesia the penis and testicles were normal without mass or lesion. On digital rectal exam the prostate was smooth without hard area or nodule.  On cystoscopy the urethra appeared normal. The prostatic urethra and bladder neck were completely obstructed by an enlarged left lateral lobe. This connected to the bladder neck from around 1:00 all the way down to about 5:00 and protruded several centimeters out into the bladder and came down and completely closed over the bladder neck. The bladder   mucosa was normal without tumor. There is severe trabeculation. There is a trabeculated band right at the end of the intramural ureter that may have obstructed the left ureter.   Left retrograde pyelogram - outlined a narrowing at the intramural ureter and proximal to this a dilated tortuous ureter up to a curve at the ureteropelvic junction,  At the curve of the ureteropelvic junction the ureter narrowed again and then filled a dilated collecting system. There is a single ureter single collecting system unit. There were no filling defects or strictures but secondary obstructions from kinking in the ureter.    left ureteroscopy - the mucosa of the ureter, UPJ,  renal pelvis and collecting system are normal without mass or tumor. Visualization was somewhat cloudy but there is no obvious erythema apart from some wire trauma. There was a kink at the left UPJ that obstructed the collecting system and another kink about  the iliacs which might also have been causing some obstruction.  Description of procedure:   patient was brought to the operating room and placed in lithotomy position. He was prepped and draped in the usual fashion after an exam under anesthesia. He was prepped and draped in usual sterile fashion. A timeout was performed to confirm the patient and procedure.   cystoscope was passed per urethra and the bladder was inspected in its entirety the 12 and 70 lens. A 6 French open-ended catheter was used to cannulate the left ureteral orifice and retrograde injection of contrast was performed. A sensor wire was attempted to be passed but would not negotiate the curve to the ureter. An angled Glidewire was used to gain access to the proximal ureter but again I could not negotiate it into the collecting system. Therefore the wire was left in the ureter and a rigid ureteroscope was passed next to the wire. The intramural ureter appeared normal the distal ureter was dilated and I could not negotiate a kink near the iliacs. One of my colleagues entered the room, Dr. Marlou Porch, therefore I removed the ureteroscope  then replaced a 6 French open-ended catheter into the proximal left ureter. With the 2 of [place] manipulating the catheter and the angled glide and putting torque on the wire we were able to steer into the collecting system and then advance an open-ended catheter into the collecting system. Repeat injection of contrast confirmed collecting system access. The Glidewire was removed and the sensor wire replaced.    We then got a second wire in the proximal ureter but could not get into the collecting system. Over the second wire advanced the digital ureteroscope into the proximal ureter. We could see the UPJ was  narrowed from a kink in the ureter but the mucosa appeared normal. Even under direct vision we could not steer a wire into the UPJ.  Therefore Over the sensor wire a dual-lumen catheter was advanced and a second  wire was placed in the collecting system. Over the second wire a digital ureteroscope was advanced into the collecting system. The collecting system was drained and the urine sent for cytology. Inspection of the collecting system noted the mucosa to be normal. On retroflex view one can see the mucosa was normal. The ureteroscope was then pulled through the UPJ but again no tumors or abnormal findings were noted. The ureter was inspected on the way out and noted to be normal apart from another kink about the iliacs. The ureteroscope was then removed and the wire backloaded on the cystoscope where a 6 x 26 cm stent was advanced. The wire was removed and a good coil seen in the kidney and a good coil in the bladder.  This portion of the procedure took about an hour and a half.  The resectoscope was then passed with the visual obturator. The right ureteral orifice started been identified. The anterior right and posterior bladder neck were never resected. The obstructing lobe emanated from the left side of the prostate. Using the resectoscope loop the lobe was resected from top to bottom. Then from bladder neck down to the area on the left side of the prostate. Resection was not carried passed the very. There was an overhanging shelf prostate anteriorly and was some pressure suprapubically this was resected. There may be some residual tissue in this area that is getting close to the bladder and bladder neck therefore I left this in place. Hemostasis was good but it was somewhat difficult to irrigate from the patient's deep breathing and also from the suprapubic pressure therefore I decided to perform a cystogram at the end of the case. There is a good channel now looking up from the veru  Having resected just the left lateral lobe.  All the chips were evacuated a Microvasive in visual inspection noted the bladder to be normal, free of chips, good hemostasis, both ureteral orifices and trigone normal.  Therefore  the scope was removed and a 20 [place] hematuria catheter was placed. 30 cc was put in the balloon and seated at the bladder neck. Cystogram confirmed a severely trabeculated bladder with severe amount of cellules but no extravasation and left vesicoureteral reflux of the stent.  The catheter was connected to gravity drainage. The patient was cleaned up and taken to recovery room stable in stable condition.  Blood loss: Minimal  Complications: None  Drains: 6 x 26 cm left ureteral stent, 20 French hematuria catheter  Specimens: #1 left renal collecting system cytology, aspiration #2 TURP chips   Disposition: Patient stable to PACU

## 2013-07-13 NOTE — Transfer of Care (Signed)
Immediate Anesthesia Transfer of Care Note  Patient: Lonnie Marshall  Procedure(s) Performed: Procedure(s): CYSTOSCOPY WITH LEFT RETROGRADE PYELOGRAM, LEFT URETEROSCOPY AND left STENT PLACEMENT, cystogram (Left) TRANSURETHRAL RESECTION OF THE PROSTATE (TURP) (N/A)  Patient Location: PACU  Anesthesia Type:General  Level of Consciousness: sedated  Airway & Oxygen Therapy: Patient Spontanous Breathing and Patient connected to face mask oxygen  Post-op Assessment: Report given to PACU RN and Post -op Vital signs reviewed and stable  Post vital signs: Reviewed and stable  Complications: No apparent anesthesia complications

## 2013-07-13 NOTE — Anesthesia Preprocedure Evaluation (Addendum)
Anesthesia Evaluation  Patient identified by MRN, date of birth, ID band Patient awake    Reviewed: Allergy & Precautions, H&P , NPO status , Patient's Chart, lab work & pertinent test results  Airway Mallampati: II TM Distance: >3 FB Neck ROM: Full    Dental no notable dental hx.    Pulmonary neg pulmonary ROS, sleep apnea ,  breath sounds clear to auscultation  Pulmonary exam normal       Cardiovascular Exercise Tolerance: Good hypertension, Pt. on medications and Pt. on home beta blockers + angina + CAD and + Past MI negative cardio ROS  + dysrhythmias + pacemaker Rhythm:Regular Rate:Normal  Denies recent chest pains.   Neuro/Psych  Neuromuscular disease negative psych ROS   GI/Hepatic negative GI ROS, Neg liver ROS,   Endo/Other  diabetes  Renal/GU Renal disease  negative genitourinary   Musculoskeletal negative musculoskeletal ROS (+)   Abdominal   Peds negative pediatric ROS (+)  Hematology negative hematology ROS (+)   Anesthesia Other Findings   Reproductive/Obstetrics negative OB ROS                          Anesthesia Physical Anesthesia Plan  ASA: III  Anesthesia Plan: General   Post-op Pain Management:    Induction: Intravenous  Airway Management Planned: LMA  Additional Equipment:   Intra-op Plan:   Post-operative Plan: Extubation in OR  Informed Consent: I have reviewed the patients History and Physical, chart, labs and discussed the procedure including the risks, benefits and alternatives for the proposed anesthesia with the patient or authorized representative who has indicated his/her understanding and acceptance.   Dental advisory given  Plan Discussed with: CRNA  Anesthesia Plan Comments:         Anesthesia Quick Evaluation

## 2013-07-13 NOTE — Interval H&P Note (Signed)
History and Physical Interval Note:  07/13/2013 7:40 AM  Lonnie Marshall  has presented today for surgery, with the diagnosis of Left Hydronephrosis  BPH  UTI  The various methods of treatment have been discussed with the patient and family. After consideration of risks, benefits and other options for treatment, the patient has consented to  Procedure(s): CYSTOSCOPY WITH LEFT RETROGRADE PYELOGRAM, LEFT URETEROSCOPY AND POSSIBLE STENT PLACEMENT (Left) TRANSURETHRAL RESECTION OF THE PROSTATE (TURP) (N/A) as a surgical intervention .  The patient's history has been reviewed, patient examined, no change in status, stable for surgery.  I have reviewed the patient's chart and labs. I discussed with the patient the nature, potential benefits, risks and alternatives to TURP, left ureteroscopy, including side effects of the proposed treatment, the likelihood of the patient achieving the goals of the procedure, and any potential problems that might occur during the procedure or recuperation. He has a friend who had a lot of bleeding and COPD exacerbation after a TURP and they discussed it which gave the patient and I candid opportunity to discuss risks of TURP such as bleeding, injury to neighboring structures and Cardio-Pulm complications. Pt has been off ASA and Plavix. Questions were answered to the patient's satisfaction.  He elects to proceed.    Antony Haste

## 2013-07-13 NOTE — Anesthesia Postprocedure Evaluation (Signed)
  Anesthesia Post-op Note  Patient: Lonnie Marshall  Procedure(s) Performed: Procedure(s) (LRB): CYSTOSCOPY WITH LEFT RETROGRADE PYELOGRAM, LEFT URETEROSCOPY AND left STENT PLACEMENT, cystogram (Left) TRANSURETHRAL RESECTION OF THE PROSTATE (TURP) (N/A)  Patient Location: PACU  Anesthesia Type: General  Level of Consciousness: awake and alert   Airway and Oxygen Therapy: Patient Spontanous Breathing  Post-op Pain: mild  Post-op Assessment: Post-op Vital signs reviewed, Patient's Cardiovascular Status Stable, Respiratory Function Stable, Patent Airway and No signs of Nausea or vomiting  Last Vitals:  Filed Vitals:   07/13/13 1200  BP: 152/71  Pulse: 60  Temp: 36.6 C  Resp: 12    Post-op Vital Signs: stable   Complications: No apparent anesthesia complications

## 2013-07-13 NOTE — Progress Notes (Signed)
[name], male   DOB: [birth year], 77 y.o.   MRN: [id number]   Pt c/o bladder spasm (feels like he need to void).   SP area and abd soft, NT, ND GU - urine clear - CBI on 1 gtt q 2-3 secs SCD's in place  S/p Left URS and stent; TURP -  Stable  Void trial in AM

## 2013-07-14 ENCOUNTER — Encounter (HOSPITAL_COMMUNITY): Payer: Self-pay | Admitting: Urology

## 2013-07-14 DIAGNOSIS — N135 Crossing vessel and stricture of ureter without hydronephrosis: Secondary | ICD-10-CM | POA: Diagnosis present

## 2013-07-14 DIAGNOSIS — N138 Other obstructive and reflux uropathy: Secondary | ICD-10-CM | POA: Diagnosis present

## 2013-07-14 NOTE — Discharge Summary (Signed)
Physician Discharge Summary  Patient ID: Author Hatlestad MRN: [id number] DOB/AGE: 1936-05-13 77 y.o.  Admit date: 07/13/2013 Discharge date: 07/14/2013  Admission Diagnoses:  Benign localized hyperplasia of prostate with urinary obstruction Ureteral obstruction  Discharge Diagnoses:  Principal Problem:   Benign localized hyperplasia of prostate with urinary obstruction Active Problems:   Ureteral obstruction   Past Medical History  Diagnosis Date  . Hypertension   . S/P coronary artery stent placement, emergently to 100% occl. LAD with DES X 2.   02/28/2012  . HTN (hypertension) 02/28/2012  . STEMI (ST elevation myocardial infarction), [facility]  02/27/2012  . Skull fracture 1942    "fell out of car; in hospital for some time"  . High cholesterol   . Overactive bladder   . Angina   . Pacemaker   . Sleep apnea     "suppose to have been tested 03/15/12"  . Arthritis   . Coronary artery disease   . Type II diabetes mellitus   . Kidney stones     "once"  . Benign localized hyperplasia of prostate with urinary obstruction 07/14/2013  . Ureteral obstruction 07/14/2013    Surgeries: Procedure(s): CYSTOSCOPY WITH LEFT RETROGRADE PYELOGRAM, LEFT URETEROSCOPY AND left STENT PLACEMENT, cystogram TRANSURETHRAL RESECTION OF THE PROSTATE (TURP) on 07/13/2013   Consultants (if any):    Discharged Condition: Improved  Hospital Course: Lonnie Marshall is an 77 y.o. male who was admitted 07/13/2013 with a diagnosis of Benign localized hyperplasia of prostate with urinary obstruction and left ureteral obstruction.  and went to the operating room on 07/13/2013 and underwent the above named procedures.  He had his foley removed this morning and is voiding well with pink urine.   He was given perioperative antibiotics:  Anti-infectives   Start     Dose/Rate Route Frequency Ordered Stop   07/13/13 1600  ceFAZolin (ANCEF) IVPB 1 g/50 mL premix     1 g 100 mL/hr over 30 Minutes Intravenous Every 8 hours  07/13/13 1205 07/14/13 0124   07/13/13 0600  ceFAZolin (ANCEF) IVPB 2 g/50 mL premix     2 g 100 mL/hr over 30 Minutes Intravenous 30 min pre-op 07/13/13 0531 07/13/13 0747   07/13/13 0545  vancomycin (VANCOCIN) IVPB 1000 mg/200 mL premix     1,000 mg 200 mL/hr over 60 Minutes Intravenous  Once 07/13/13 0531 07/13/13 0755   07/13/13 0000  ciprofloxacin (CIPRO) 500 MG tablet     500 mg Oral 2 times daily 07/13/13 1123      .  He was given sequential compression devices  for DVT prophylaxis.  He benefited maximally from the hospital stay and there were no complications.    Recent vital signs:  Filed Vitals:   07/14/13 0958  BP: 150/73  Pulse: 59  Temp: 97.6 F (36.4 C)  Resp: 16    Recent laboratory studies:  Lab Results  Component Value Date   HGB 14.2 07/05/2013   HGB 11.5* 03/02/2012   HGB 12.3* 03/01/2012   Lab Results  Component Value Date   WBC 6.9 07/05/2013   PLT 244 07/05/2013   Lab Results  Component Value Date   INR 1.06 02/27/2012   Lab Results  Component Value Date   NA 138 07/05/2013   K 4.8 07/05/2013   CL 105 07/05/2013   CO2 27 07/05/2013   BUN 16 07/05/2013   CREATININE 1.06 07/05/2013   GLUCOSE 169* 07/05/2013    Discharge Medications:     Medication List  aspirin EC 81 MG tablet  Take 1 tablet (81 mg total) by mouth every morning.  Start taking on:  07/16/2013     atorvastatin 80 MG tablet  Commonly known as:  LIPITOR  Take 40 mg by mouth daily at 6 PM.     cholecalciferol 1000 UNITS tablet  Commonly known as:  VITAMIN D  Take 1,000 Units by mouth daily.     ciprofloxacin 500 MG tablet  Commonly known as:  CIPRO  Take 1 tablet (500 mg total) by mouth 2 (two) times daily.     clopidogrel 75 MG tablet  Commonly known as:  PLAVIX  Take 1 tablet (75 mg total) by mouth every morning.  Start taking on:  07/18/2013     docusate sodium 100 MG capsule  Commonly known as:  COLACE  Take 1 capsule (100 mg total) by mouth 2 (two) times  daily.     finasteride 5 MG tablet  Commonly known as:  PROSCAR  Take 5 mg by mouth daily.     Fish Oil 1200 MG Caps  Take 1,200 mg by mouth 2 (two) times daily.     isosorbide mononitrate 30 MG 24 hr tablet  Commonly known as:  IMDUR  Take 15 mg by mouth daily.     lisinopril 10 MG tablet  Commonly known as:  PRINIVIL,ZESTRIL  Take 5 mg by mouth every morning.     metoprolol 50 MG tablet  Commonly known as:  LOPRESSOR  Take 25 mg by mouth 2 (two) times daily.     oxybutynin 5 MG tablet  Commonly known as:  DITROPAN  Take 10 mg by mouth 3 (three) times daily.     oxyCODONE-acetaminophen 5-325 MG per tablet  Commonly known as:  ROXICET  Take 1 tablet by mouth every 4 (four) hours as needed for pain.        Diagnostic Studies: Dg Chest 2 View  07/05/2013   *RADIOLOGY REPORT*  Clinical Data: Hypertension, previous smoker.  CHEST - 2 VIEW  Comparison: Mar 18, 2012  Findings: The lungs are hyperinflated.  There is no focal infiltrate, pulmonary edema, or pleural effusion.  The mediastinal contour and cardiac silhouette are normal.  Dual lead cardiac pacemaker is stable.  The soft tissues and osseous structures are stable.  IMPRESSION: No acute cardiopulmonary disease identified.   Original Report Authenticated By: Sherian Rein, M.D.    Disposition: 01-Home or Self Care      Discharge Orders   Future Appointments Provider Department Dept Phone   09/03/2013 6:05 AM [facility] [place] [phone]   Future Orders Complete By Expires   Discontinue IV  As directed       Follow-up Information   Follow up with Antony Haste, MD In 2 weeks.   Specialty:  Urology   Contact information:   [address] [place] [address] [phone]        Signed: Anner Crete 07/14/2013, 9:59 AM

## 2013-07-14 NOTE — Progress Notes (Signed)
Patient discharged home; discharge instructions given and explained to patient/family and they verbalized understanding, patient denies any pain/distress. Skin intact, no wound noted. Accompanied home by wife/daugter, transported to the car via wheelchair by staff.

## 2013-07-16 ENCOUNTER — Encounter (HOSPITAL_COMMUNITY): Payer: Self-pay | Admitting: Urology

## 2013-09-01 LAB — PACEMAKER DEVICE OBSERVATION

## 2013-09-03 ENCOUNTER — Ambulatory Visit (INDEPENDENT_AMBULATORY_CARE_PROVIDER_SITE_OTHER): Payer: Medicare Other | Admitting: *Deleted

## 2013-09-03 ENCOUNTER — Telehealth: Payer: Self-pay | Admitting: *Deleted

## 2013-09-03 ENCOUNTER — Ambulatory Visit: Payer: Medicare Other | Admitting: *Deleted

## 2013-09-03 DIAGNOSIS — R001 Bradycardia, unspecified: Secondary | ICD-10-CM

## 2013-09-03 DIAGNOSIS — I441 Atrioventricular block, second degree: Secondary | ICD-10-CM

## 2013-09-03 DIAGNOSIS — I498 Other specified cardiac arrhythmias: Secondary | ICD-10-CM

## 2013-09-03 DIAGNOSIS — I471 Supraventricular tachycardia: Secondary | ICD-10-CM

## 2013-09-03 LAB — MDC_IDC_ENUM_SESS_TYPE_REMOTE
Battery Voltage: 2.95 V
Brady Statistic RV Percent Paced: 3.5 %
Implantable Pulse Generator Model: 2210
Implantable Pulse Generator Serial Number: 7336473
Lead Channel Impedance Value: 390 Ohm
Lead Channel Sensing Intrinsic Amplitude: 9.6 mV
Lead Channel Setting Pacing Amplitude: 2.5 V
Lead Channel Setting Sensing Sensitivity: 2 mV

## 2013-09-03 NOTE — Telephone Encounter (Signed)
Pt's wife called stating that they do not know how to do a remote pacer check. He is due today and she didn't know that. She would like a call back to explain it to her.

## 2013-09-03 NOTE — Telephone Encounter (Signed)
Informed pt's wife that the transmissions for his ppm send automatically, and that one has already come through. Patient's wife voiced understanding.

## 2013-09-05 IMAGING — CR DG CHEST 2V
2 series · 2 of 2 positions shown · non-contrast
Comparison: March 18, 2012

CLINICAL DATA: Hypertension, previous smoker.

CHEST - 2 VIEW

[w chest pa]
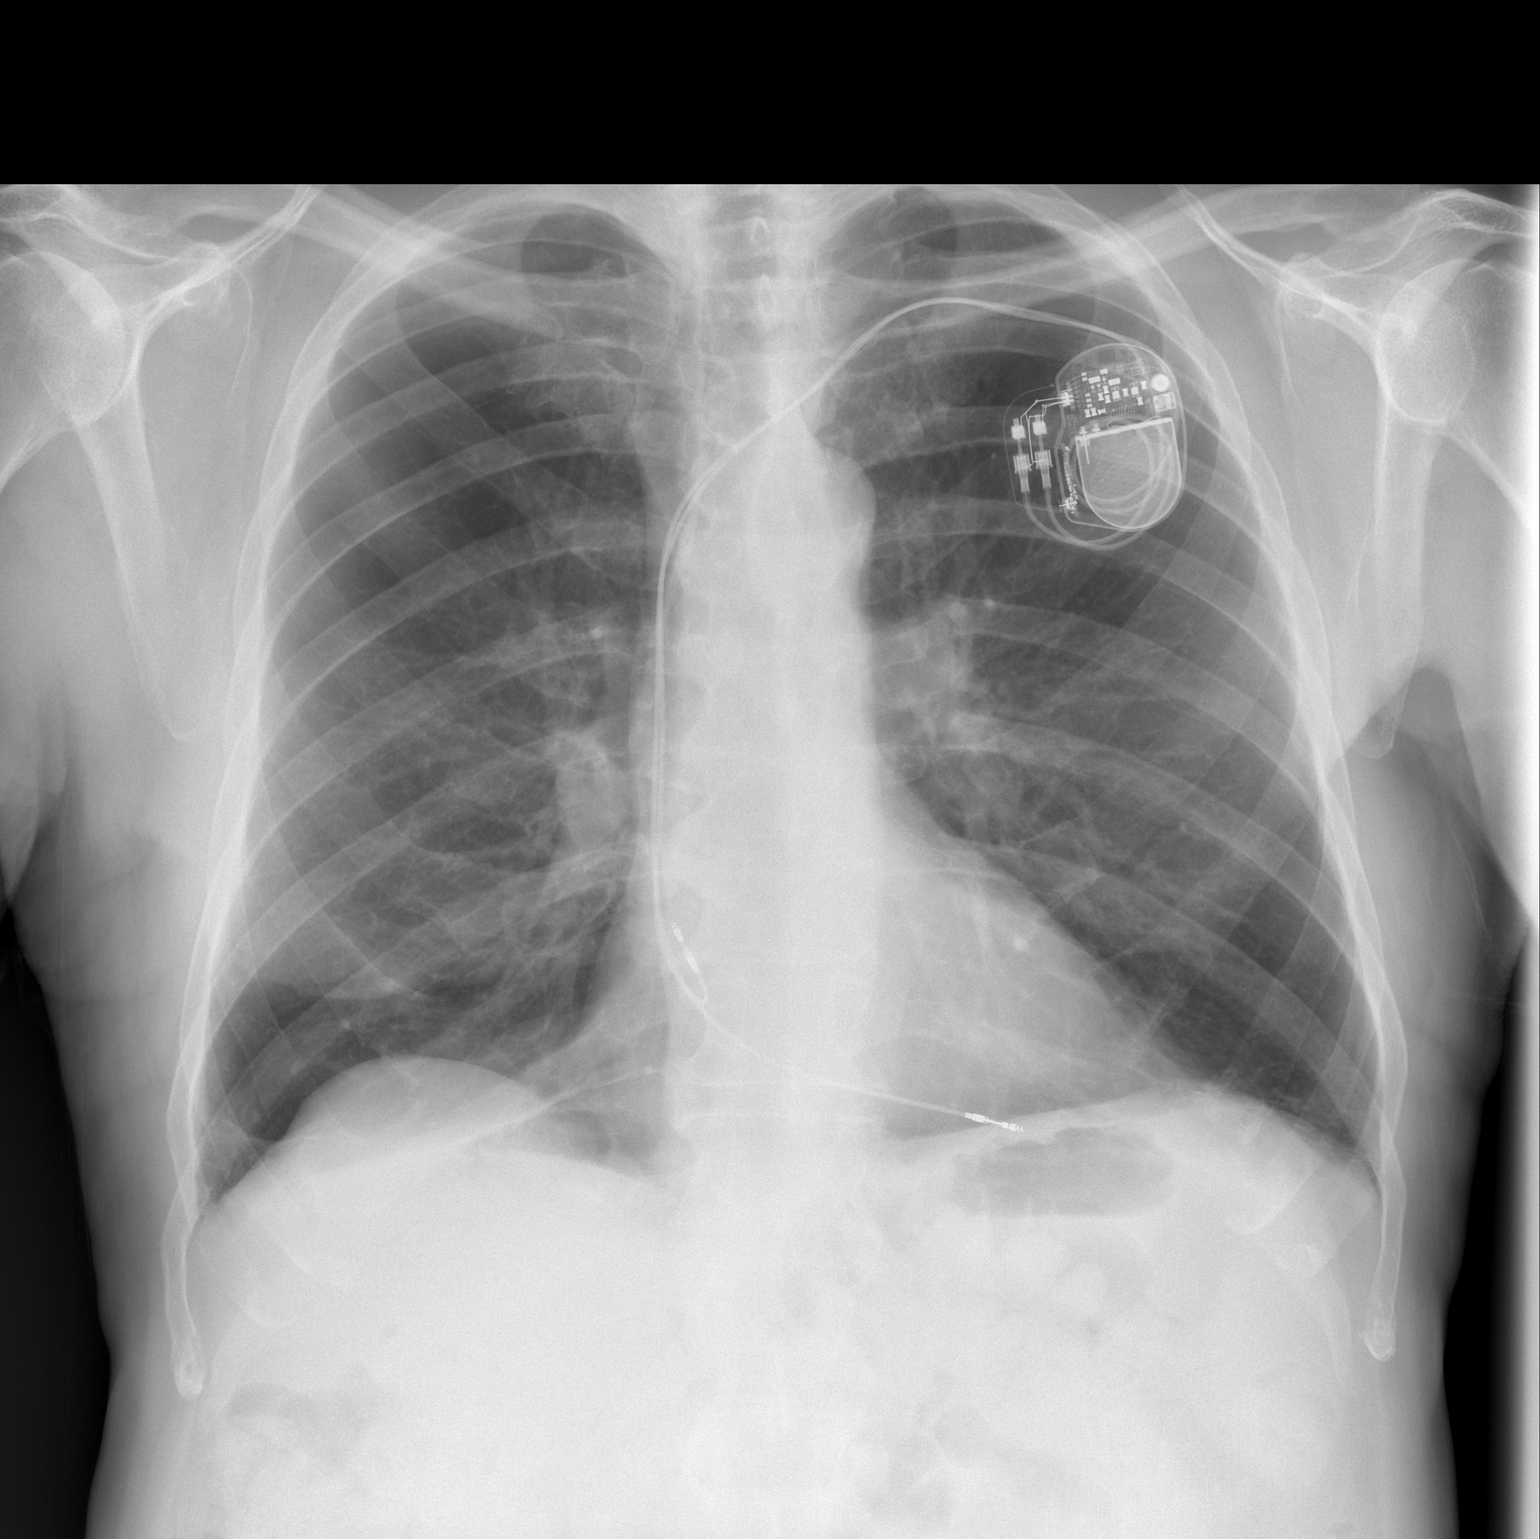

[w chest lat]
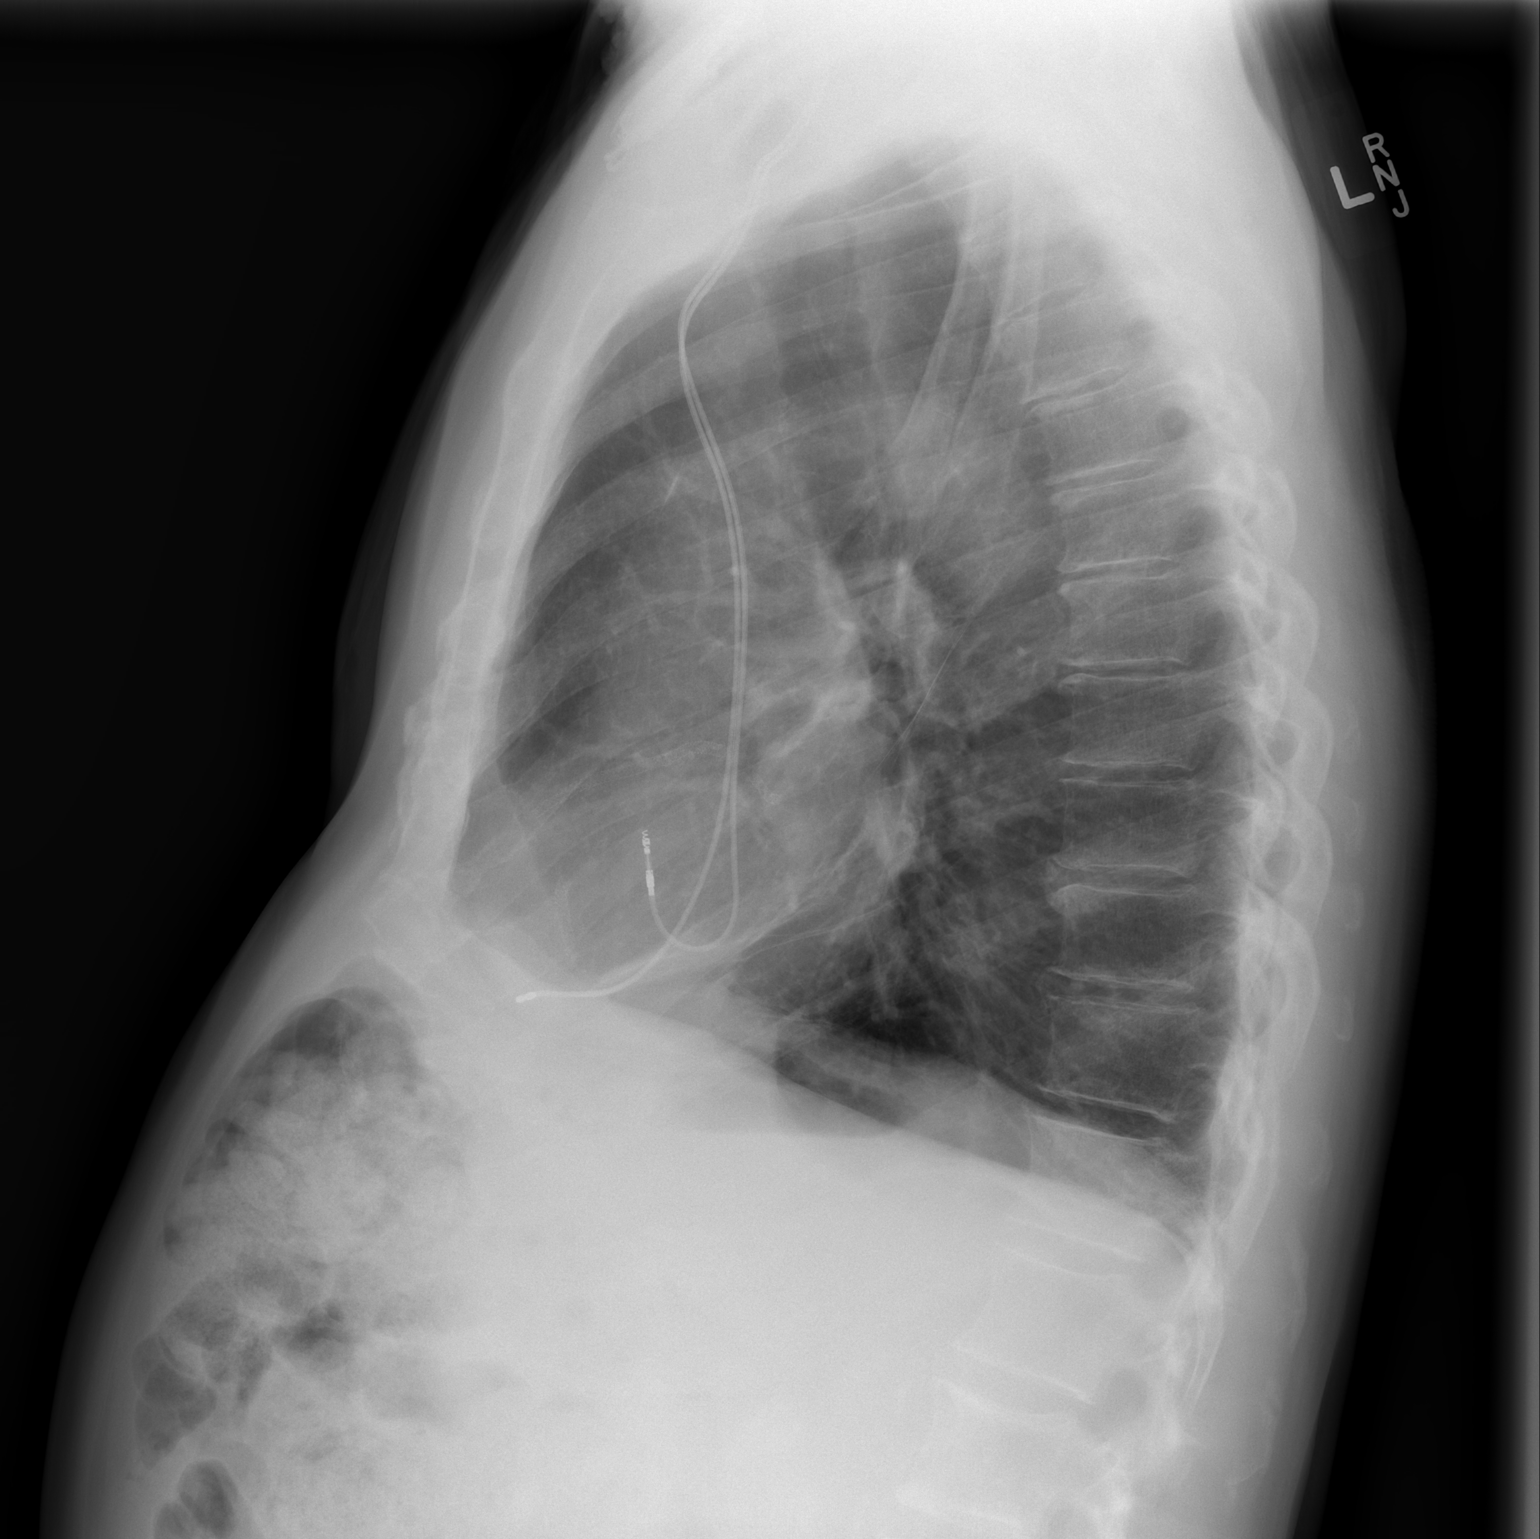

[2 of 2 positions shown; findings below may reference images not displayed]

FINDINGS: The lungs are hyperinflated.  There is no focal
infiltrate, pulmonary edema, or pleural effusion.  The mediastinal
contour and cardiac silhouette are normal.  Dual lead cardiac
pacemaker is stable.  The soft tissues and osseous structures are
stable.}
IMPRESSION: No acute cardiopulmonary disease identified.

## 2013-09-10 ENCOUNTER — Encounter: Payer: Self-pay | Admitting: Cardiovascular Disease

## 2013-09-10 ENCOUNTER — Ambulatory Visit: Payer: Medicare Other | Admitting: Cardiovascular Disease

## 2013-09-10 ENCOUNTER — Ambulatory Visit (INDEPENDENT_AMBULATORY_CARE_PROVIDER_SITE_OTHER): Payer: Medicare Other | Admitting: Cardiovascular Disease

## 2013-09-10 VITALS — BP 128/61 | HR 60 | Ht 70.0 in | Wt 170.5 lb

## 2013-09-10 DIAGNOSIS — I2510 Atherosclerotic heart disease of native coronary artery without angina pectoris: Secondary | ICD-10-CM

## 2013-09-10 DIAGNOSIS — Z950 Presence of cardiac pacemaker: Secondary | ICD-10-CM

## 2013-09-10 NOTE — Patient Instructions (Signed)
Your physician recommends that you schedule a follow-up appointment in: ONE YEAR 

## 2013-09-16 NOTE — Progress Notes (Signed)
[name], male   DOB: [birth year], 77 y.o.   MRN: [id number]      Reason for office visit Pacemaker followup, CAD  [name] has history of both sinus node dysfunction and second-degree atrioventricular block Mobitz type I. He received a pacemaker roughly 18 months ago that has been functioning normally. He has a history of coronary disease he had ST segment elevation myocardial infarction around the time of pacemaker implantation.  He denies chest pain, SOB,or edema .  Occas. lightheadedness he thinks is probably linked to medications and by description it sounds like orthostatic hypotension. Hospitalized in September for prostate problems.  No Known Allergies  Current Outpatient Prescriptions  Medication Sig Dispense Refill  . aspirin EC 81 MG tablet Take 1 tablet (81 mg total) by mouth every morning.      Marland Kitchen atorvastatin (LIPITOR) 80 MG tablet Take 40 mg by mouth daily at 6 PM.       . cholecalciferol (VITAMIN D) 1000 UNITS tablet Take 1,000 Units by mouth daily.      . clopidogrel (PLAVIX) 75 MG tablet Take 1 tablet (75 mg total) by mouth every morning.      . docusate sodium (COLACE) 100 MG capsule Take 1 capsule (100 mg total) by mouth 2 (two) times daily.  10 capsule  0  . finasteride (PROSCAR) 5 MG tablet Take 5 mg by mouth daily.      . isosorbide mononitrate (IMDUR) 30 MG 24 hr tablet Take 15 mg by mouth daily.      Marland Kitchen lisinopril (PRINIVIL,ZESTRIL) 10 MG tablet Take 5 mg by mouth every morning.      . metoprolol (LOPRESSOR) 50 MG tablet Take 25 mg by mouth 2 (two) times daily.      . Omega-3 Fatty Acids (FISH OIL) 1200 MG CAPS Take 1,200 mg by mouth 2 (two) times daily.       Marland Kitchen oxyCODONE-acetaminophen (ROXICET) 5-325 MG per tablet Take 1 tablet by mouth every 4 (four) hours as needed for pain.  30 tablet  0   No current facility-administered medications for this visit.    Past Medical History  Diagnosis Date  . Hypertension   . S/P coronary artery stent  placement, emergently to 100% occl. LAD with DES X 2.   02/28/2012  . HTN (hypertension) 02/28/2012  . STEMI (ST elevation myocardial infarction), [facility]  02/27/2012  . Skull fracture 1942    "fell out of car; in hospital for some time"  . High cholesterol   . Overactive bladder   . Angina   . Pacemaker   . Sleep apnea     "suppose to have been tested 03/15/12"  . Arthritis   . Coronary artery disease   . Type II diabetes mellitus   . Kidney stones     "once"  . Benign localized hyperplasia of prostate with urinary obstruction 07/14/2013  . Ureteral obstruction 07/14/2013    Past Surgical History  Procedure Laterality Date  . Circumcision  1980's  . Insert / replace / remove pacemaker  03/17/12    "first one"  . Lumbar disc surgery  ~ 1985  . Back surgery    . Cardiac catheterization  02/27/12    "1"  . Coronary angioplasty with stent placement  03/01/12    "+2=3 total"  . Cystoscopy with retrograde pyelogram, ureteroscopy and stent placement Left 07/13/2013    Procedure: CYSTOSCOPY WITH LEFT RETROGRADE PYELOGRAM, LEFT URETEROSCOPY AND left STENT PLACEMENT, cystogram;  Surgeon:  Antony Haste, MD;  Location: [facility];  Service: Urology;  Laterality: Left;  . Transurethral resection of prostate N/A 07/13/2013    Procedure: TRANSURETHRAL RESECTION OF THE PROSTATE (TURP);  Surgeon: Antony Haste, MD;  Location: [facility];  Service: Urology;  Laterality: N/A;    No family history on file.  History   Social History  . Marital Status: Married    Spouse Name: N/A    Number of Children: N/A  . Years of Education: N/A   Occupational History  . Not on file.   Social History Main Topics  . Smoking status: Former Smoker -- 3.00 packs/day for 20 years    Types: Cigarettes    Quit date: 03/03/1975  . Smokeless tobacco: Current User    Types: Chew     Comment: 03/17/12 "cutting down chew by 2/3 already"  . Alcohol Use: No  . Drug Use: No  . Sexual Activity: Not Currently     Other Topics Concern  . Not on file   Social History Narrative  . No narrative on file    Review of systems: The patient specifically denies any chest pain at rest or with exertion, dyspnea at rest or with exertion, orthopnea, paroxysmal nocturnal dyspnea, syncope, palpitations, focal neurological deficits, intermittent claudication, lower extremity edema, unexplained weight gain, cough, hemoptysis or wheezing.  The patient also denies abdominal pain, nausea, vomiting, dysphagia, diarrhea, constipation, polyuria, polydipsia, dysuria, hematuria, frequency, urgency, abnormal bleeding or bruising, fever, chills, unexpected weight changes, mood swings, change in skin or hair texture, change in voice quality, auditory or visual problems, allergic reactions or rashes, new musculoskeletal complaints other than usual "aches and pains".   PHYSICAL EXAM BP 128/61  Pulse 60  Ht 5\' 10"  (1.778 m)  Wt 170 lb 8 oz (77.338 kg)  BMI 24.46 kg/m2  General: Alert, oriented x3, no distress Head: no evidence of trauma, PERRL, EOMI, no exophtalmos or lid lag, no myxedema, no xanthelasma; normal ears, nose and oropharynx Neck: normal jugular venous pulsations and no hepatojugular reflux; brisk carotid pulses without delay and no carotid bruits Chest: clear to auscultation, no signs of consolidation by percussion or palpation, normal fremitus, symmetrical and full respiratory excursions, the pacemaker site Cardiovascular: normal position and quality of the apical impulse, regular rhythm, normal first and second heart sounds, no murmurs, rubs or gallops Abdomen: no tenderness or distention, no masses by palpation, no abnormal pulsatility or arterial bruits, normal bowel sounds, no hepatosplenomegaly Extremities: no clubbing, cyanosis or edema; 2+ radial, ulnar and brachial pulses bilaterally; 2+ right femoral, posterior tibial and dorsalis pedis pulses; 2+ left femoral, posterior tibial and dorsalis pedis  pulses; no subclavian or femoral bruits Neurological: grossly nonfocal   EKG: Atrial paced ventricular sensed rhythm otherwise normal  Lipid Panel     Component Value Date/Time   CHOL 169 02/28/2012 0857   TRIG 104 02/28/2012 0857   HDL 41 02/28/2012 0857   CHOLHDL 4.1 02/28/2012 0857   VLDL 21 02/28/2012 0857   LDLCALC 107* 02/28/2012 0857    BMET    Component Value Date/Time   NA 138 07/05/2013 1020   K 4.8 07/05/2013 1020   CL 105 07/05/2013 1020   CO2 27 07/05/2013 1020   GLUCOSE 169* 07/05/2013 1020   BUN 16 07/05/2013 1020   CREATININE 1.06 07/05/2013 1020   CALCIUM 8.9 07/05/2013 1020   GFRNONAA 67* 07/05/2013 1020   GFRAA 77* 07/05/2013 1020     ASSESSMENT AND PLAN Pacemaker St Jude  Accent DR RF device implanted 03/17/12, for sinus node pauses. Normal device function. Roughly 33% atrial pacing and 3.5% ventricular pacing. Only 4 mode switch episodes recorded all of them very brief (8 seconds, all of them appear to be paroxysmal atrial tachycardia. Device longevity around 7-8 years. Lead parameters are excellent. No permanent changes are made to device settings. He is an excellent candidate for remote pacemaker monitoring via the [facility] system  Orders Placed This Encounter  Procedures  . EKG 12-Lead   Patient Instructions  Your physician recommends that you schedule a follow-up appointment in: ONE YEAR    and to followup with Dr. Tresa Endo for coronary problems  Junious Silk, MD, [facility] [phone] office [phone] pager

## 2013-09-16 NOTE — Assessment & Plan Note (Signed)
St Jude Accent DR RF device implanted 03/17/12, for sinus node pauses. Normal device function. Roughly 33% atrial pacing and 3.5% ventricular pacing. Only 4 mode switch episodes recorded all of them very brief (8 seconds, all of them appear to be paroxysmal atrial tachycardia. Device longevity around 7-8 years. Lead parameters are excellent. No permanent changes are made to device settings. He is an excellent candidate for remote pacemaker monitoring via the [facility] system

## 2013-09-25 ENCOUNTER — Encounter: Payer: Self-pay | Admitting: *Deleted

## 2013-09-26 ENCOUNTER — Ambulatory Visit (INDEPENDENT_AMBULATORY_CARE_PROVIDER_SITE_OTHER): Payer: Medicare Other | Admitting: Cardiovascular Disease

## 2013-09-26 ENCOUNTER — Encounter: Payer: Self-pay | Admitting: Cardiovascular Disease

## 2013-09-26 VITALS — BP 114/70 | HR 60 | Ht 70.0 in | Wt 170.2 lb

## 2013-09-26 DIAGNOSIS — I2510 Atherosclerotic heart disease of native coronary artery without angina pectoris: Secondary | ICD-10-CM

## 2013-09-26 DIAGNOSIS — I10 Essential (primary) hypertension: Secondary | ICD-10-CM

## 2013-09-26 DIAGNOSIS — E785 Hyperlipidemia, unspecified: Secondary | ICD-10-CM

## 2013-09-26 DIAGNOSIS — E119 Type 2 diabetes mellitus without complications: Secondary | ICD-10-CM | POA: Insufficient documentation

## 2013-09-26 NOTE — Progress Notes (Signed)
[name], male   DOB: 04-15-1936, 77 y.o.   MRN: [id number]     HPI: Lonnie Marshall is a 77 y.o. male presents for 87 month cardiology evaluation.  [name] suffered an anterior wall myocardial infarction on 02/27/2012 inch early after arrival to: ER required CPR and required atropine plus epinephrine. Escorted him acutely to the cardiac catheterization laboratory where he was found to have total proximal LAD occlusion with severe multivessel CAD. At that time, he underwent successful stenting with overlapping stents 2.75x24 2.5x38 DES Xience and Promus with reestablishment of TIMI 3 flow. 3 days later he underwent staged intervention to the obtuse marginal branch of the circumflex vessel as well as his) artery. He subsequently underwent permanent pacemaker implantation. Also has a history of complex sleep apnea or with both obstructive and central events of which he has been treated with bilevel ST therapy. In September 2013 a nuclear study showed distal LAD scar but otherwise significant myocardial salvage and other territories. Post-rest ejection fraction was 64%. He tells me he is also followed at the [facility]. He sees Dr. freed. Recent laboratory by Dr. Foy Guadal[place]ara did show a hemoglobin A1c of 7.2. The patient has not been on any therapy for diabetes mellitus. He also has a history of hyperlipidemia. He recently underwent pacemaker interrogation by Dr. Royann Shivers and had intact pacemaker function of which he was atrially paced 33% of the time with 3.5% ventricular pacing.  Past Medical History  Diagnosis Date  . Hypertension   . S/P coronary artery stent placement, emergently to 100% occl. LAD with DES X 2.   02/28/2012  . HTN (hypertension) 02/28/2012  . STEMI (ST elevation myocardial infarction), [facility]  02/27/2012  . Skull fracture 1942    "fell out of car; in hospital for some time"  . High cholesterol   . Overactive bladder   . Angina   . Pacemaker   . Sleep apnea    "suppose to have been tested 03/15/12"  . Arthritis   . Coronary artery disease   . Type II diabetes mellitus   . Kidney stones     "once"  . Benign localized hyperplasia of prostate with urinary obstruction 07/14/2013  . Ureteral obstruction 07/14/2013    Past Surgical History  Procedure Laterality Date  . Circumcision  1980's  . Insert / replace / remove pacemaker  03/17/12    "first one"  . Lumbar disc surgery  ~ 1985  . Back surgery    . Cardiac catheterization  02/27/12    "1"  . Coronary angioplasty with stent placement  03/01/12    "+2=3 total"  . Cystoscopy with retrograde pyelogram, ureteroscopy and stent placement Left 07/13/2013    Procedure: CYSTOSCOPY WITH LEFT RETROGRADE PYELOGRAM, LEFT URETEROSCOPY AND left STENT PLACEMENT, cystogram;  Surgeon: Antony Haste, MD;  Location: [facility];  Service: Urology;  Laterality: Left;  . Transurethral resection of prostate N/A 07/13/2013    Procedure: TRANSURETHRAL RESECTION OF THE PROSTATE (TURP);  Surgeon: Antony Haste, MD;  Location: [facility];  Service: Urology;  Laterality: N/A;    No Known Allergies  Current Outpatient Prescriptions  Medication Sig Dispense Refill  . aspirin EC 81 MG tablet Take 1 tablet (81 mg total) by mouth every morning.      Marland Kitchen atorvastatin (LIPITOR) 80 MG tablet Take 40 mg by mouth daily at 6 PM.       . cholecalciferol (VITAMIN D) 1000 UNITS tablet Take 1,000 Units by mouth  daily.      . clopidogrel (PLAVIX) 75 MG tablet Take 1 tablet (75 mg total) by mouth every morning.      . docusate sodium (COLACE) 100 MG capsule Take 1 capsule (100 mg total) by mouth 2 (two) times daily.  10 capsule  0  . isosorbide mononitrate (IMDUR) 30 MG 24 hr tablet Take 15 mg by mouth daily.      Marland Kitchen lisinopril (PRINIVIL,ZESTRIL) 10 MG tablet Take 5 mg by mouth every morning.      . metoprolol (LOPRESSOR) 50 MG tablet Take 25 mg by mouth 2 (two) times daily.      . Omega-3 Fatty Acids (FISH OIL) 1200 MG CAPS Take 1,200  mg by mouth 2 (two) times daily.        No current facility-administered medications for this visit.    History   Social History  . Marital Status: Married    Spouse Name: N/A    Number of Children: N/A  . Years of Education: N/A   Occupational History  . Not on file.   Social History Main Topics  . Smoking status: Former Smoker -- 3.00 packs/day for 20 years    Types: Cigarettes    Quit date: 03/03/1975  . Smokeless tobacco: Current User    Types: Chew     Comment: 03/17/12 "cutting down chew by 2/3 already"  . Alcohol Use: No  . Drug Use: No  . Sexual Activity: Not Currently   Other Topics Concern  . Not on file   Social History Narrative  . No narrative on file   Social history is normal that he is married, has 2 children and 3 grandchildren. He does not routinely exercise. He has chewed tobacco on rare occasions.   History reviewed. No pertinent family history.  ROS is negative for fevers, chills or night sweats. He denies skin rash. He denies PND or orthopnea. He is unaware of tachycardia palpitations. He denies wheezing. He denies cough. He denies anginal symptoms. He denies changes in his bowel movements. He denies nausea vomiting or diarrhea. He recently underwent cystoscopy with left retrograde pyelogram and left uterus copy and left stent placement by Dr. Mena Goes in September. He denies edema. He denies myalgias. There is no claudication. He is not on medication for probable type 2 diabetes mellitus. He denies thyroid abnormalities. He denies excessive bleeding. He is able to tolerate statin therapy.  Other comprehensive 12 point system review is negative.  PE BP 114/70  Pulse 60  Ht 5\' 10"  (1.778 m)  Wt 170 lb 3.2 oz (77.202 kg)  BMI 24.42 kg/m2  General: Alert, oriented, no distress.  Skin: normal turgor, no rashes HEENT: Normocephalic, atraumatic. Pupils round and reactive; sclera anicteric;no lid lag.  Nose without nasal septal hypertrophy Mouth/Parynx  benign; Mallinpatti scale 3 Neck: No JVD, soft left carotid bruit. Lungs: clear to ausculatation and percussion; no wheezing or rales Heart: RRR, s1 s2 normal 1/6 systolic murmur. No S3 gallop or S4. Abdomen: soft, nontender; no hepatosplenomehaly, BS+; abdominal aorta nontender and not dilated by palpation. Pulses 2+ Extremities: no clubbing cyanosis or edema, Homan's sign negative  Neurologic: grossly nonfocal Psychologic: normal affect and mood.  ECG: Atrial lead paced ventricular sensed rhythm at 60s beats per minute per no ectopy  LABS:  BMET    Component Value Date/Time   NA 138 07/05/2013 1020   K 4.8 07/05/2013 1020   CL 105 07/05/2013 1020   CO2 27 07/05/2013 1020   GLUCOSE  169* 07/05/2013 1020   BUN 16 07/05/2013 1020   CREATININE 1.06 07/05/2013 1020   CALCIUM 8.9 07/05/2013 1020   GFRNONAA 67* 07/05/2013 1020   GFRAA 77* 07/05/2013 1020     Hepatic Function Panel     Component Value Date/Time   PROT 6.6 02/27/2012 1250   ALBUMIN 3.5 02/27/2012 1250   AST 17 02/27/2012 1250   ALT 10 02/27/2012 1250   ALKPHOS 54 02/27/2012 1250   BILITOT 0.4 02/27/2012 1250     CBC    Component Value Date/Time   WBC 6.9 07/05/2013 1020   RBC 4.55 07/05/2013 1020   HGB 14.2 07/05/2013 1020   HCT 41.4 07/05/2013 1020   PLT 244 07/05/2013 1020   MCV 91.0 07/05/2013 1020   MCH 31.2 07/05/2013 1020   MCHC 34.3 07/05/2013 1020   RDW 13.1 07/05/2013 1020     BNP No results found for this basename: probnp    Lipid Panel     Component Value Date/Time   CHOL 169 02/28/2012 0857   TRIG 104 02/28/2012 0857   HDL 41 02/28/2012 0857   CHOLHDL 4.1 02/28/2012 0857   VLDL 21 02/28/2012 0857   LDLCALC 107* 02/28/2012 0857     RADIOLOGY: No results found.    ASSESSMENT AND PLAN:  Mr. Juanangel Soderholm is now 18 months status post his anterior wall myocardial infarction which required initial CPR atropine and epinephrine. He underwent successful opening of a totally occluded proximal LAD which was  diffusely diseased and required 62 mm of DES stenting to his LAD system. He also underwent staged intervention several days later to his circumflex marginal and right coronary artery. He is well with beta blocker therapy without ectopy. He continues to be on dual antiplatelet therapy and should be on this indefinitely. His blood pressure is well-controlled and he is tolerating lisinopril. I did review recent laboratory done by Dr. freed which showed a total cholesterol 112 triglycerides 73 HDL 48 LDL 49 combination atorvastatin and fish oil. I discussed with him that he is diabetic with by virtue of his hemoglobin A1c of 7.2. I suggested that since there has now been significant dietary adjustments that this be rechecked in 3 months by Dr. freed with consideration of initiation of therapy with metformin if necessary at that time. I will see him in 6 months for followup carotid evaluation or sooner if problems develop.    Lennette Bihari, MD, [facility]  09/26/2013 11:03 AM

## 2013-09-26 NOTE — Patient Instructions (Addendum)
Your physician recommends that you schedule a follow-up appointment in: 6 MONTHS. No changes were made today in your therapy. 

## 2013-10-01 ENCOUNTER — Encounter: Payer: Self-pay | Admitting: Cardiovascular Disease

## 2013-12-03 ENCOUNTER — Ambulatory Visit (INDEPENDENT_AMBULATORY_CARE_PROVIDER_SITE_OTHER): Payer: Medicare Other | Admitting: *Deleted

## 2013-12-03 DIAGNOSIS — I471 Supraventricular tachycardia, unspecified: Secondary | ICD-10-CM

## 2013-12-03 DIAGNOSIS — I441 Atrioventricular block, second degree: Secondary | ICD-10-CM

## 2013-12-05 ENCOUNTER — Other Ambulatory Visit: Payer: Self-pay | Admitting: Cardiovascular Disease

## 2013-12-05 LAB — PACEMAKER DEVICE OBSERVATION

## 2013-12-06 ENCOUNTER — Telehealth: Payer: Self-pay | Admitting: *Deleted

## 2013-12-06 NOTE — Telephone Encounter (Signed)
Faxed colonoscopy clearance letter back.

## 2013-12-07 LAB — MDC_IDC_ENUM_SESS_TYPE_REMOTE
Battery Remaining Longevity: 86 mo
Battery Voltage: 2.95 V
Brady Statistic AP VP Percent: 3.4 %
Brady Statistic AS VP Percent: 1 %
Brady Statistic RV Percent Paced: 4.3 %
Implantable Pulse Generator Model: 2210
Implantable Pulse Generator Serial Number: 7336473
Lead Channel Impedance Value: 440 Ohm
Lead Channel Pacing Threshold Amplitude: 1 V
Lead Channel Pacing Threshold Pulse Width: 0.4 ms
MDC IDC MSMT LEADCHNL RA SENSING INTR AMPL: 4.7 mV
MDC IDC MSMT LEADCHNL RV IMPEDANCE VALUE: 480 Ohm
MDC IDC MSMT LEADCHNL RV PACING THRESHOLD AMPLITUDE: 1 V
MDC IDC MSMT LEADCHNL RV PACING THRESHOLD PULSEWIDTH: 0.4 ms
MDC IDC MSMT LEADCHNL RV SENSING INTR AMPL: 12 mV
MDC IDC SESS DTM: 20150128181024
MDC IDC SET LEADCHNL RA PACING AMPLITUDE: 2.5 V
MDC IDC SET LEADCHNL RV PACING AMPLITUDE: 2.5 V
MDC IDC SET LEADCHNL RV PACING PULSEWIDTH: 0.4 ms
MDC IDC SET LEADCHNL RV SENSING SENSITIVITY: 2 mV
MDC IDC STAT BRADY AP VS PERCENT: 34 %
MDC IDC STAT BRADY AS VS PERCENT: 62 %
MDC IDC STAT BRADY RA PERCENT PACED: 37 %

## 2013-12-12 ENCOUNTER — Encounter: Payer: Self-pay | Admitting: *Deleted

## 2014-01-02 ENCOUNTER — Telehealth (INDEPENDENT_AMBULATORY_CARE_PROVIDER_SITE_OTHER): Payer: Self-pay

## 2014-01-02 NOTE — Telephone Encounter (Signed)
Pt's wife advised that Dr Harlow Asa will be hospitalist next week and his first day back in office is 01-15-14. I advised her to please call if pt starts to have symptoms. She states she understands.

## 2014-01-15 ENCOUNTER — Ambulatory Visit (INDEPENDENT_AMBULATORY_CARE_PROVIDER_SITE_OTHER): Payer: Medicare Other | Admitting: Surgery

## 2014-01-15 ENCOUNTER — Encounter (INDEPENDENT_AMBULATORY_CARE_PROVIDER_SITE_OTHER): Payer: Self-pay

## 2014-01-15 ENCOUNTER — Telehealth (INDEPENDENT_AMBULATORY_CARE_PROVIDER_SITE_OTHER): Payer: Self-pay

## 2014-01-15 ENCOUNTER — Encounter (INDEPENDENT_AMBULATORY_CARE_PROVIDER_SITE_OTHER): Payer: Self-pay | Admitting: Surgery

## 2014-01-15 ENCOUNTER — Other Ambulatory Visit (INDEPENDENT_AMBULATORY_CARE_PROVIDER_SITE_OTHER): Payer: Self-pay | Admitting: Surgery

## 2014-01-15 VITALS — BP 112/68 | HR 72 | Temp 98.8°F | Resp 12 | Ht 69.0 in | Wt 174.4 lb

## 2014-01-15 DIAGNOSIS — D126 Benign neoplasm of colon, unspecified: Secondary | ICD-10-CM

## 2014-01-15 NOTE — Patient Instructions (Signed)
[facility]  OPEN ABDOMINAL SURGERY: POST OP INSTRUCTIONS  Always review your discharge instruction sheet given to you by the facility where your surgery was performed.  1. A prescription for pain medication may be given to you upon discharge.  Take your pain medication as prescribed.  If narcotic pain medicine is not needed, then you may take acetaminophen (Tylenol) or ibuprofen (Advil) as needed. 2. Take your usually prescribed medications unless otherwise directed. 3. If you need a refill on your pain medication, please contact your pharmacy. They will contact our office to request authorization.  Prescriptions will not be filled after 5 pm or on weekends. 4. You should follow a light diet the first few days after arrival home, such as soup and crackers, unless your doctor has advised otherwise. A high-fiber, low fat diet can be resumed as tolerated.  Be sure to include plenty of fluids daily.  5. Most patients will experience some swelling and bruising in the area of the incision. Ice packs will help. Swelling and bruising can take several days to resolve. 6. It is common to experience some constipation if taking pain medication after surgery.  Increasing fluid intake and taking a stool softener will usually help or prevent this problem from occurring.  A mild laxative (Milk of Magnesia or Miralax) should be taken according to package directions if there are no bowel movements after 48 hours. 7.  You may have steri-strips (small skin tapes) in place directly over the incision.  These strips should be left on the skin for 7-10 days.  If your surgeon used skin glue on the incision, you may shower in 24 hours.  The glue will flake off over the next 2-3 weeks.  Any sutures or staples will be removed at the office during your follow-up visit. You may find that a light gauze bandage over your incision may keep your staples from being rubbed or pulled. You may shower and replace the bandage  daily. 8. ACTIVITIES:  You may resume regular (light) daily activities beginning the next day-such as daily self-care, walking, climbing stairs-gradually increasing activities as tolerated.  You may have sexual intercourse when it is comfortable.  Refrain from any heavy lifting or straining until approved by your doctor.  You may drive when you no longer are taking prescription pain medication, you can comfortably wear a seatbelt, and you can safely maneuver your car and apply brakes. 9. You should see your doctor in the office for a follow-up appointment approximately two weeks after your surgery.  Make sure that you call for this appointment within a day or two after you arrive home to insure a convenient appointment time.  WHEN TO CALL YOUR DOCTOR: 1. Fever greater than 101.0 2. Inability to urinate 3. Persistent nausea and/or vomiting 4. Extreme swelling or bruising 5. Continued bleeding from incision 6. Increased pain, redness, or drainage from the incision 7. Difficulty swallowing or breathing 8. Muscle cramping or spasms 9. Numbness or tingling in hands or around lips  IF YOU HAVE DISABILITY OR FAMILY LEAVE FORMS, YOU MUST BRING THEM TO THE OFFICE FOR PROCESSING.  PLEASE DO NOT GIVE THEM TO YOUR DOCTOR.  The clinic staff is available to answer your questions during regular business hours.  Please don't hesitate to call and ask to speak to one of the nurses if you have concerns.  [facility] [place] Office: [phone]  For further questions, please visit [email or web address]

## 2014-01-15 NOTE — Telephone Encounter (Signed)
Request for cardiac clearance and anticoag management sent to Dr Corky Downs. Pt given 2 day colon prep.

## 2014-01-15 NOTE — Progress Notes (Signed)
General Surgery [facility]  Chief Complaint  Patient presents with  . New Evaluation    tubulovillous adenoma of cecum - referral from Dr. Wilford Corner    HISTORY: Patient is a 78 year old male referred by his gastroenterologist for a tubulovillous adenoma of the cecum which is not resectable endoscopically. Patient was having a routine screening colonoscopy. He was approximately 2-3 years overdue from his prior procedures. He was found to have a large tubulovillous adenoma in the cecum. Biopsy showed no dysplasia or malignancy. Patient denies any bleeding correct. He has had no symptoms. He now presents to consider resection for definitive diagnosis and management.  Patient has had no prior abdominal surgery. He does have a significant history of coronary artery disease and has a pacemaker in place. He takes aspirin and Plavix.  Past Medical History  Diagnosis Date  . Hypertension   . S/P coronary artery stent placement, emergently to 100% occl. LAD with DES X 2.   02/28/2012  . HTN (hypertension) 02/28/2012  . STEMI (ST elevation myocardial infarction), [place]  02/27/2012  . Skull fracture 1942    "fell out of car; in hospital for some time"  . High cholesterol   . Overactive bladder   . Angina   . Pacemaker   . Sleep apnea     "suppose to have been tested 03/15/12"  . Arthritis   . Coronary artery disease   . Type II diabetes mellitus   . Kidney stones     "once"  . Benign localized hyperplasia of prostate with urinary obstruction 07/14/2013  . Ureteral obstruction 07/14/2013    Current Outpatient Prescriptions  Medication Sig Dispense Refill  . aspirin EC 81 MG tablet Take 1 tablet (81 mg total) by mouth every morning.      Marland Kitchen atorvastatin (LIPITOR) 80 MG tablet Take 40 mg by mouth daily at 6 PM.       . cholecalciferol (VITAMIN D) 1000 UNITS tablet Take 1,000 Units by mouth daily.      . clopidogrel (PLAVIX) 75 MG tablet Take 1 tablet (75 mg total) by  mouth every morning.      . docusate sodium (COLACE) 100 MG capsule Take 1 capsule (100 mg total) by mouth 2 (two) times daily.  10 capsule  0  . finasteride (PROSCAR) 5 MG tablet Take 5 mg by mouth daily.      . isosorbide mononitrate (IMDUR) 30 MG 24 hr tablet Take 15 mg by mouth daily.      Marland Kitchen lisinopril (PRINIVIL,ZESTRIL) 10 MG tablet Take 5 mg by mouth every morning.      Marland Kitchen METFORMIN HCL ER, MOD, PO Take by mouth.      . metoprolol (LOPRESSOR) 50 MG tablet Take 25 mg by mouth 2 (two) times daily.      . Omega-3 Fatty Acids (FISH OIL) 1200 MG CAPS Take 1,200 mg by mouth 2 (two) times daily.        No current facility-administered medications for this visit.    No Known Allergies  History reviewed. No pertinent family history.  History   Social History  . Marital Status: Married    Spouse Name: N/A    Number of Children: N/A  . Years of Education: N/A   Social History Main Topics  . Smoking status: Former Smoker -- 3.00 packs/day for 20 years    Types: Cigarettes    Quit date: 03/03/1975  . Smokeless tobacco: Current User    Types:  Chew     Comment: 03/17/12 "cutting down chew by 2/3 already"  . Alcohol Use: No  . Drug Use: No  . Sexual Activity: Not Currently   Other Topics Concern  . None   Social History Narrative  . None    REVIEW OF SYSTEMS - PERTINENT POSITIVES ONLY: Denies bleeding per rectum. Denies changes in bowel habits. Denies abdominal pain.  EXAMDanley Danker Vitals:   01/15/14 0951  BP: 112/68  Pulse: 72  Temp: 98.8 F (37.1 C)  Resp: 12    GENERAL: well-developed, well-nourished, no acute distress HEENT: normocephalic; pupils equal and reactive; sclerae clear; dentition good; mucous membranes moist NECK:  No palpable nodules in the thyroid bed; symmetric on extension; no palpable anterior or posterior cervical lymphadenopathy; no supraclavicular masses; no tenderness CHEST: clear to auscultation bilaterally without rales, rhonchi, or  wheezes CARDIAC: regular rate and rhythm without significant murmur; peripheral pulses are full ABDOMEN: soft without distension; bowel sounds present; no mass; no hepatosplenomegaly; no hernia EXT:  non-tender without edema; no deformity NEURO: no gross focal deficits; no sign of tremor   LABORATORY RESULTS: See [facility] (CHL-Epic) for most recent results  RADIOLOGY RESULTS: See [facility] (CHL-Epic) for most recent results  IMPRESSION: #1 tubulovillous adenoma of the cecum #2 personal history of coronary artery disease with stent placement and pacemaker #3 type 2 diabetes  PLAN: I discussed the above findings at length with the patient and his wife. I provided them with written literature to review at home. We discussed laparoscopic technique versus open surgery. We discussed the hospital stay to be anticipated. We discussed his postoperative recovery and return to normal activity. They understand and wish to proceed with surgery in the near future.  We will request clearance from cardiology prior to surgery.  The risks and benefits of the procedure have been discussed at length with the patient.  The patient understands the proposed procedure, potential alternative treatments, and the course of recovery to be expected.  All of the patient's questions have been answered at this time.  The patient wishes to proceed with surgery.  Earnstine Regal, MD, [facility]  Primary Care Physician: Abigail Miyamoto, MD

## 2014-01-25 ENCOUNTER — Telehealth: Payer: Self-pay | Admitting: *Deleted

## 2014-01-25 NOTE — Telephone Encounter (Signed)
Message forwarded to Dr. Kelly/Wanda, CMA.  

## 2014-01-25 NOTE — Telephone Encounter (Signed)
Pt's wife has called about having some colon surgery. Dr. Lynford Humphrey office stated that they were supposed to get in touch with [place] about having him come off his medication. She is concerned that no one has called her back.  TK

## 2014-01-28 ENCOUNTER — Encounter (INDEPENDENT_AMBULATORY_CARE_PROVIDER_SITE_OTHER): Payer: Self-pay

## 2014-01-28 NOTE — Telephone Encounter (Signed)
Message forwarded to Wanda, CMA.  

## 2014-01-28 NOTE — Telephone Encounter (Signed)
Please call - still waiting to hear from you.

## 2014-01-30 NOTE — Telephone Encounter (Signed)
Spoke with patient's wife informed her okay per Dr. Claiborne Billings to hold ASA and Plavix for 5 days prior to having surgery. She will have Dr. Lynford Humphrey to send formal clearance letter for his signature.

## 2014-01-30 NOTE — Telephone Encounter (Signed)
Will need to hold plavix and ASA for at least 5 days prior to  surgery

## 2014-02-04 ENCOUNTER — Telehealth (INDEPENDENT_AMBULATORY_CARE_PROVIDER_SITE_OTHER): Payer: Self-pay

## 2014-02-04 NOTE — Telephone Encounter (Signed)
Attached clearance note.

## 2014-02-04 NOTE — Telephone Encounter (Signed)
Message copied by Dois Davenport on Mon Feb 04, 2014  2:39 PM ------      Message from: Earnstine Regal      Created: Mon Feb 04, 2014  2:26 PM      Regarding: RE: Cardiac Clearance?       I see the following note in Epic:            Lauralee Evener, CMA at 01/30/2014  2:17 PM         Status: Signed                 Spoke with patient's wife informed her okay per Dr. Claiborne Billings to hold ASA and Plavix for 5 days prior to having surgery. She will have Dr. Lynford Humphrey to send formal clearance letter for his signature.                        Troy Sine, MD at 01/30/2014 10:24 AM         Status: Signed                 Will need to hold plavix and ASA for at least 5 days prior to  surgery                          I would go ahead and schedule this patient for surgery.            Possible dates 4/6, 4/9, 4/10.            Thanks.  Let me know if I need to do anything else.            tmg            Earnstine Regal, MD, [facility]      Office: [phone]                        ----- Message -----         From: Tennis Ship         Sent: 02/01/2014   8:28 AM           To: Earnstine Regal, MD, Dois Davenport, LPN      Subject: Cardiac Clearance?                                       This patients wife has called multiple times concerning getting his surgery scheduled. We have been waiting on cardiac clearance from Dr. Evette Georges office. The day before yesterday Mrs. Eichel called me stating that Dr. Evette Georges office never got the letter [facility] sent to them on 3/10. I re-faxed the letter to their office on 3/25 and you all were supposed to hear from Dr. Evette Georges office yesterday. Did they ever contact you? Please let me know as the [facility] are very anxious in getting his surgery set up. Please advise.            Thanks,      Colletta Maryland             ------

## 2014-02-05 ENCOUNTER — Telehealth (INDEPENDENT_AMBULATORY_CARE_PROVIDER_SITE_OTHER): Payer: Self-pay | Admitting: General Surgery

## 2014-02-05 ENCOUNTER — Encounter (HOSPITAL_COMMUNITY): Payer: Self-pay | Admitting: Pharmacy Technician

## 2014-02-05 NOTE — Telephone Encounter (Signed)
Lonnie Marshall from [place] [facility] with Dr. Claiborne Billings, called in to let us know that Dr. Seleta Rhymes the patient to stop his aspirin and plavix 5 days before hand, but that the cardiac clearance letter we sent asked for only cardiac clearance.  If this is the case Dr. Claiborne Billings would need to see the patient before he can give a full okay due to it being over a year since he last saw the patient.  If they need to make him an appointment, they cannot guarantee they would be able to get him in before 02/11/14 which is his surgery date.   I informed Lonnie Marshall that I would get clarification from Dr. Harlow Asa and his assistant in regards to them wanting a full cardiac clearance or just an okay to stop those medications prior to surgery.

## 2014-02-05 NOTE — Telephone Encounter (Signed)
Spoke with [facility]. Informed her that we received formal clearance request for patient to have his colon resection. Explained to her that Dr. Claiborne Billings only cleared the patient to stop his plavix and ASA. If he needs medical clearance he will need a appointment. She will check with Dr. Harlow Asa to see if medical clearance is needed and advise.

## 2014-02-06 ENCOUNTER — Telehealth (INDEPENDENT_AMBULATORY_CARE_PROVIDER_SITE_OTHER): Payer: Self-pay

## 2014-02-06 ENCOUNTER — Telehealth: Payer: Self-pay | Admitting: *Deleted

## 2014-02-06 ENCOUNTER — Telehealth: Payer: Self-pay | Admitting: Cardiovascular Disease

## 2014-02-06 NOTE — Telephone Encounter (Signed)
Perioperative Presciption for Implanted Cardiac Device Programming form received and signed by Dr. Sallyanne Kuster.  Faxed back.

## 2014-02-06 NOTE — Telephone Encounter (Signed)
Call r/t clearance for surgery.  Transferred to [place], [place].

## 2014-02-06 NOTE — Telephone Encounter (Signed)
I received msg from [facility] Dr [facility] nurse that once Dr Claiborne Billings reviewed chart he has decided pt does need ov and probable testing prior to colon surgery. I spoke with Mariann Laster and she will review chart and note with Dr Claiborne Billings to see if appt with him or PA can be expedited.  Pts wife notified of this and that surgery date may change if workup cannot be completed prior to 02-13-14 when the prep is to begin. She states she understands. I advised her I will contact them once we have heard back from Dr Evette Georges office.

## 2014-02-07 ENCOUNTER — Telehealth (INDEPENDENT_AMBULATORY_CARE_PROVIDER_SITE_OTHER): Payer: Self-pay

## 2014-02-07 NOTE — Telephone Encounter (Signed)
Spoke with  [facility]. Clearance deferred to Dr. Claiborne Billings.

## 2014-02-07 NOTE — Telephone Encounter (Signed)
Wife advised that clearance notation is in epic under notes from Dr Evette Georges nurse on 02-07-14. Pt advised we will proceed with surgery as planned and to d/c blood thinners as advised by Dr Harlow Asa. She states she understands.

## 2014-02-07 NOTE — Telephone Encounter (Signed)
Message copied by Dois Davenport on Thu Feb 07, 2014  4:22 PM ------      Message from: Lauralee Evener.      Created: Thu Feb 07, 2014  2:21 PM      Regarding: surgical Clearance       Cindy per V.O. From Dr. Claiborne Billings okay for patient to have colon resection. He is clear from a cardiac standpoint. He will need to hold ASA and clopidogrel for 5 days.            Thanks,            W. Saverio Danker, CMA; AAMA ------

## 2014-02-07 NOTE — Telephone Encounter (Signed)
Pt wanted to speak to you personally in regards to her husband.   TK

## 2014-02-07 NOTE — Telephone Encounter (Signed)
Informed Caren Griffins Smithey-LPN per V.O. From Dr. Claiborne Billings okay to give patient surgical clearance to have colon resection.

## 2014-02-07 NOTE — Telephone Encounter (Signed)
Message forwarded to Wanda, CMA.  

## 2014-02-11 ENCOUNTER — Encounter (HOSPITAL_COMMUNITY)
Admission: RE | Admit: 2014-02-11 | Discharge: 2014-02-11 | Disposition: A | Discharge status: Home / Self Care | Payer: Medicare Other | Source: Ambulatory Visit | Attending: Surgery | Admitting: Surgery

## 2014-02-11 ENCOUNTER — Encounter (HOSPITAL_COMMUNITY): Payer: Self-pay

## 2014-02-11 HISTORY — DX: Adverse effect of unspecified anesthetic, initial encounter: T41.45XA

## 2014-02-11 LAB — CBC
HCT: 41 % (ref 39.0–52.0)
Hemoglobin: 13.9 g/dL (ref 13.0–17.0)
MCH: 31.1 pg (ref 26.0–34.0)
MCHC: 33.9 g/dL (ref 30.0–36.0)
MCV: 91.7 fL (ref 78.0–100.0)
PLATELETS: 255 10*3/uL (ref 150–400)
RBC: 4.47 MIL/uL (ref 4.22–5.81)
RDW: 13.8 % (ref 11.5–15.5)
WBC: 7.1 10*3/uL (ref 4.0–10.5)

## 2014-02-11 LAB — BASIC METABOLIC PANEL
BUN: 18 mg/dL (ref 6–23)
CALCIUM: 9.1 mg/dL (ref 8.4–10.5)
CO2: 25 mEq/L (ref 19–32)
CREATININE: 0.88 mg/dL (ref 0.50–1.35)
Chloride: 105 mEq/L (ref 96–112)
GFR calc Af Amer: >90 mL/min (ref >90)
GFR, EST NON AFRICAN AMERICAN: 81 mL/min — AB (ref >90)
Glucose, Bld: 176 mg/dL — ABNORMAL HIGH (ref 70–99)
Potassium: 4.5 mEq/L (ref 3.7–5.3)
SODIUM: 142 meq/L (ref 137–147)

## 2014-02-11 LAB — ABO/RH: ABO/RH(D): A NEG

## 2014-02-11 NOTE — Progress Notes (Signed)
EKG 09/10/13 in EPIC, Chest x-ray 07/05/13 in EPIC, perioperative prescription for implanted cardiac device form on chart

## 2014-02-11 NOTE — Patient Instructions (Addendum)
[place]  02/11/2014   Your procedure is scheduled on: 02/14/14  Report to [facility] at 7:00 AM.  Call this number if you have problems the morning of surgery 336-: [phone]   Remember: please follow bowel prep instructions   Do not eat food or drink liquids After Midnight.     Take these medicines the morning of surgery with A SIP OF WATER: metoprolol, isosorbide mononitrate (imdur), lipitor   Do not wear jewelry, make-up or nail polish.  Do not wear lotions, powders, or perfumes. You may wear deodorant.  Do not shave 48 hours prior to surgery. Men may shave face and neck.  Do not bring valuables to the hospital.  Contacts, dentures or bridgework may not be worn into surgery.  Leave suitcase in the car. After surgery it may be brought to your room.  For patients admitted to the hospital, checkout time is 11:00 AM the day of discharge.    Please read over the following fact sheets that you were given:[place] preparing for surgery sheet,  blood fact sheet Paulette Blanch, RN  pre op nurse call if needed [phone]    FAILURE TO [facility]   Patient Signature: ___________________________________________

## 2014-02-12 NOTE — Progress Notes (Signed)
Quick Note:  These results are acceptable for scheduled surgery.  Midas Daughety M. Maanav Kassabian, MD, FACS [facility] Office: [phone]   ______ 

## 2014-02-14 ENCOUNTER — Encounter (HOSPITAL_COMMUNITY): Payer: Medicare Other | Admitting: Certified Registered Nurse Anesthetist

## 2014-02-14 ENCOUNTER — Encounter (HOSPITAL_COMMUNITY)
Admission: RE | Disposition: A | Discharge status: Home / Self Care | Payer: Self-pay | Source: Ambulatory Visit | Attending: Surgery

## 2014-02-14 ENCOUNTER — Encounter (HOSPITAL_COMMUNITY): Payer: Self-pay | Admitting: *Deleted

## 2014-02-14 ENCOUNTER — Inpatient Hospital Stay (HOSPITAL_COMMUNITY)
Admission: RE | Admit: 2014-02-14 | Discharge: 2014-02-18 | DRG: 331 | Disposition: A | Discharge status: Home / Self Care | Payer: Medicare Other | Source: Ambulatory Visit | Attending: Surgery | Admitting: Surgery

## 2014-02-14 ENCOUNTER — Inpatient Hospital Stay (HOSPITAL_COMMUNITY): Payer: Medicare Other | Admitting: Certified Registered Nurse Anesthetist

## 2014-02-14 DIAGNOSIS — D374 Neoplasm of uncertain behavior of colon: Secondary | ICD-10-CM | POA: Diagnosis present

## 2014-02-14 DIAGNOSIS — D126 Benign neoplasm of colon, unspecified: Principal | ICD-10-CM | POA: Diagnosis present

## 2014-02-14 DIAGNOSIS — Z950 Presence of cardiac pacemaker: Secondary | ICD-10-CM

## 2014-02-14 DIAGNOSIS — E7800 Pure hypercholesterolemia, unspecified: Secondary | ICD-10-CM | POA: Diagnosis present

## 2014-02-14 DIAGNOSIS — I252 Old myocardial infarction: Secondary | ICD-10-CM

## 2014-02-14 DIAGNOSIS — I10 Essential (primary) hypertension: Secondary | ICD-10-CM | POA: Diagnosis present

## 2014-02-14 DIAGNOSIS — N400 Enlarged prostate without lower urinary tract symptoms: Secondary | ICD-10-CM | POA: Diagnosis present

## 2014-02-14 DIAGNOSIS — Z87442 Personal history of urinary calculi: Secondary | ICD-10-CM

## 2014-02-14 DIAGNOSIS — Z7982 Long term (current) use of aspirin: Secondary | ICD-10-CM

## 2014-02-14 DIAGNOSIS — D371 Neoplasm of uncertain behavior of stomach: Secondary | ICD-10-CM

## 2014-02-14 DIAGNOSIS — Z87891 Personal history of nicotine dependence: Secondary | ICD-10-CM

## 2014-02-14 DIAGNOSIS — I2510 Atherosclerotic heart disease of native coronary artery without angina pectoris: Secondary | ICD-10-CM | POA: Diagnosis present

## 2014-02-14 DIAGNOSIS — Z79899 Other long term (current) drug therapy: Secondary | ICD-10-CM

## 2014-02-14 DIAGNOSIS — E119 Type 2 diabetes mellitus without complications: Secondary | ICD-10-CM | POA: Diagnosis present

## 2014-02-14 DIAGNOSIS — D375 Neoplasm of uncertain behavior of rectum: Secondary | ICD-10-CM

## 2014-02-14 DIAGNOSIS — D378 Neoplasm of uncertain behavior of other specified digestive organs: Secondary | ICD-10-CM

## 2014-02-14 DIAGNOSIS — G4730 Sleep apnea, unspecified: Secondary | ICD-10-CM | POA: Diagnosis present

## 2014-02-14 HISTORY — PX: PARTIAL COLECTOMY: SHX5273

## 2014-02-14 LAB — CBC
HEMATOCRIT: 39.3 % (ref 39.0–52.0)
HEMOGLOBIN: 13.7 g/dL (ref 13.0–17.0)
MCH: 31.3 pg (ref 26.0–34.0)
MCHC: 34.9 g/dL (ref 30.0–36.0)
MCV: 89.7 fL (ref 78.0–100.0)
Platelets: 245 10*3/uL (ref 150–400)
RBC: 4.38 MIL/uL (ref 4.22–5.81)
RDW: 12.9 % (ref 11.5–15.5)
WBC: 11.7 10*3/uL — ABNORMAL HIGH (ref 4.0–10.5)

## 2014-02-14 LAB — GLUCOSE, CAPILLARY
GLUCOSE-CAPILLARY: 166 mg/dL — AB (ref 70–99)
GLUCOSE-CAPILLARY: 195 mg/dL — AB (ref 70–99)
Glucose-Capillary: 128 mg/dL — ABNORMAL HIGH (ref 70–99)
Glucose-Capillary: 127 mg/dL — ABNORMAL HIGH (ref 70–99)

## 2014-02-14 LAB — CREATININE, SERUM
Creatinine, Ser: 0.93 mg/dL (ref 0.50–1.35)
GFR calc Af Amer: >90 mL/min (ref >90)
GFR calc non Af Amer: 80 mL/min — ABNORMAL LOW (ref >90)

## 2014-02-14 LAB — TYPE AND SCREEN
ABO/RH(D): A NEG
ANTIBODY SCREEN: NEGATIVE

## 2014-02-14 SURGERY — COLECTOMY, PARTIAL
Anesthesia: General | Site: Abdomen

## 2014-02-14 MED ORDER — ALVIMOPAN 12 MG PO CAPS
12.0000 mg | ORAL_CAPSULE | Freq: Once | ORAL | Status: AC
  Administered 2014-02-14: 12 mg via ORAL
  Filled 2014-02-14: qty 1

## 2014-02-14 MED ORDER — ENOXAPARIN SODIUM 40 MG/0.4ML ~~LOC~~ SOLN
40.0000 mg | SUBCUTANEOUS | Status: DC
  Administered 2014-02-15 – 2014-02-18 (×4): 40 mg via SUBCUTANEOUS
  Filled 2014-02-14 (×4): qty 0.4

## 2014-02-14 MED ORDER — FENTANYL CITRATE 0.05 MG/ML IJ SOLN
INTRAMUSCULAR | Status: AC
  Filled 2014-02-14: qty 5

## 2014-02-14 MED ORDER — HYDROMORPHONE HCL PF 1 MG/ML IJ SOLN
0.2500 mg | INTRAMUSCULAR | Status: DC | PRN
  Administered 2014-02-14 (×2): 0.25 mg via INTRAVENOUS

## 2014-02-14 MED ORDER — SODIUM CHLORIDE 0.9 % IJ SOLN
INTRAMUSCULAR | Status: AC
  Filled 2014-02-14: qty 10

## 2014-02-14 MED ORDER — ACETAMINOPHEN 10 MG/ML IV SOLN
1000.0000 mg | Freq: Once | INTRAVENOUS | Status: AC
  Administered 2014-02-14: 1000 mg via INTRAVENOUS
  Filled 2014-02-14: qty 100

## 2014-02-14 MED ORDER — ONDANSETRON HCL 4 MG/2ML IJ SOLN
4.0000 mg | Freq: Four times a day (QID) | INTRAMUSCULAR | Status: DC | PRN
  Administered 2014-02-15 (×2): 4 mg via INTRAVENOUS
  Filled 2014-02-14 (×3): qty 2

## 2014-02-14 MED ORDER — HYDROMORPHONE HCL PF 1 MG/ML IJ SOLN
INTRAMUSCULAR | Status: DC | PRN
  Administered 2014-02-14: 1 mg via INTRAVENOUS

## 2014-02-14 MED ORDER — MIDAZOLAM HCL 2 MG/2ML IJ SOLN
INTRAMUSCULAR | Status: AC
  Filled 2014-02-14: qty 2

## 2014-02-14 MED ORDER — FENTANYL CITRATE 0.05 MG/ML IJ SOLN
INTRAMUSCULAR | Status: DC | PRN
  Administered 2014-02-14 (×6): 50 ug via INTRAVENOUS

## 2014-02-14 MED ORDER — 0.9 % SODIUM CHLORIDE (POUR BTL) OPTIME
TOPICAL | Status: DC | PRN
  Administered 2014-02-14: 2000 mL

## 2014-02-14 MED ORDER — ONDANSETRON HCL 4 MG/2ML IJ SOLN
INTRAMUSCULAR | Status: AC
  Filled 2014-02-14: qty 2

## 2014-02-14 MED ORDER — HYDROCODONE-ACETAMINOPHEN 5-325 MG PO TABS
1.0000 | ORAL_TABLET | ORAL | Status: DC | PRN
  Administered 2014-02-15 (×2): 1 via ORAL
  Filled 2014-02-14 (×2): qty 1

## 2014-02-14 MED ORDER — PROPOFOL 10 MG/ML IV BOLUS
INTRAVENOUS | Status: AC
  Filled 2014-02-14: qty 20

## 2014-02-14 MED ORDER — ROCURONIUM BROMIDE 100 MG/10ML IV SOLN
INTRAVENOUS | Status: AC
  Filled 2014-02-14: qty 1

## 2014-02-14 MED ORDER — MEPERIDINE HCL 50 MG/ML IJ SOLN: 6.2500 mg | INTRAMUSCULAR | Status: DC | PRN

## 2014-02-14 MED ORDER — NEOSTIGMINE METHYLSULFATE 1 MG/ML IJ SOLN
INTRAMUSCULAR | Status: DC | PRN
  Administered 2014-02-14: 4 mg via INTRAVENOUS

## 2014-02-14 MED ORDER — HYDROMORPHONE HCL PF 1 MG/ML IJ SOLN
INTRAMUSCULAR | Status: AC
  Filled 2014-02-14: qty 1

## 2014-02-14 MED ORDER — HYDROMORPHONE HCL PF 2 MG/ML IJ SOLN
INTRAMUSCULAR | Status: AC
  Filled 2014-02-14: qty 1

## 2014-02-14 MED ORDER — ROCURONIUM BROMIDE 100 MG/10ML IV SOLN
INTRAVENOUS | Status: DC | PRN
  Administered 2014-02-14: 50 mg via INTRAVENOUS

## 2014-02-14 MED ORDER — FENTANYL CITRATE 0.05 MG/ML IJ SOLN
INTRAMUSCULAR | Status: AC
  Filled 2014-02-14: qty 2

## 2014-02-14 MED ORDER — ONDANSETRON HCL 4 MG/2ML IJ SOLN
INTRAMUSCULAR | Status: DC | PRN
  Administered 2014-02-14: 4 mg via INTRAVENOUS

## 2014-02-14 MED ORDER — SUCCINYLCHOLINE CHLORIDE 20 MG/ML IJ SOLN
INTRAMUSCULAR | Status: DC | PRN
  Administered 2014-02-14: 100 mg via INTRAVENOUS

## 2014-02-14 MED ORDER — LIDOCAINE HCL (CARDIAC) 20 MG/ML IV SOLN
INTRAVENOUS | Status: AC
  Filled 2014-02-14: qty 5

## 2014-02-14 MED ORDER — ONDANSETRON HCL 4 MG PO TABS: 4.0000 mg | ORAL_TABLET | Freq: Four times a day (QID) | ORAL | Status: DC | PRN

## 2014-02-14 MED ORDER — LIDOCAINE HCL (CARDIAC) 20 MG/ML IV SOLN
INTRAVENOUS | Status: DC | PRN
  Administered 2014-02-14: 80 mg via INTRAVENOUS

## 2014-02-14 MED ORDER — OXYCODONE HCL 5 MG PO TABS: 5.0000 mg | ORAL_TABLET | Freq: Once | ORAL | Status: DC | PRN

## 2014-02-14 MED ORDER — LACTATED RINGERS IV SOLN
INTRAVENOUS | Status: DC
  Administered 2014-02-14 (×2): via INTRAVENOUS
  Administered 2014-02-14: 1000 mL via INTRAVENOUS

## 2014-02-14 MED ORDER — EPHEDRINE SULFATE 50 MG/ML IJ SOLN
INTRAMUSCULAR | Status: DC | PRN
  Administered 2014-02-14: 5 mg via INTRAVENOUS

## 2014-02-14 MED ORDER — PROMETHAZINE HCL 25 MG/ML IJ SOLN: 6.2500 mg | INTRAMUSCULAR | Status: DC | PRN

## 2014-02-14 MED ORDER — PHENYLEPHRINE 40 MCG/ML (10ML) SYRINGE FOR IV PUSH (FOR BLOOD PRESSURE SUPPORT)
PREFILLED_SYRINGE | INTRAVENOUS | Status: AC
  Filled 2014-02-14: qty 10

## 2014-02-14 MED ORDER — OXYCODONE HCL 5 MG/5ML PO SOLN: 5.0000 mg | Freq: Once | ORAL | Status: DC | PRN

## 2014-02-14 MED ORDER — NEOSTIGMINE METHYLSULFATE 1 MG/ML IJ SOLN
INTRAMUSCULAR | Status: AC
  Filled 2014-02-14: qty 10

## 2014-02-14 MED ORDER — KCL IN DEXTROSE-NACL 20-5-0.45 MEQ/L-%-% IV SOLN
INTRAVENOUS | Status: DC
  Administered 2014-02-14 – 2014-02-15 (×3): via INTRAVENOUS
  Administered 2014-02-16: 75 mL/h via INTRAVENOUS
  Administered 2014-02-16 – 2014-02-17 (×3): via INTRAVENOUS
  Filled 2014-02-14 (×8): qty 1000

## 2014-02-14 MED ORDER — PROPOFOL 10 MG/ML IV BOLUS
INTRAVENOUS | Status: DC | PRN
  Administered 2014-02-14: 150 mg via INTRAVENOUS

## 2014-02-14 MED ORDER — MIDAZOLAM HCL 5 MG/5ML IJ SOLN
INTRAMUSCULAR | Status: DC | PRN
  Administered 2014-02-14: 1 mg via INTRAVENOUS

## 2014-02-14 MED ORDER — EPHEDRINE SULFATE 50 MG/ML IJ SOLN
INTRAMUSCULAR | Status: AC
  Filled 2014-02-14: qty 1

## 2014-02-14 MED ORDER — INSULIN ASPART 100 UNIT/ML ~~LOC~~ SOLN
0.0000 [IU] | Freq: Three times a day (TID) | SUBCUTANEOUS | Status: DC
  Administered 2014-02-15: 2 [IU] via SUBCUTANEOUS
  Administered 2014-02-15 – 2014-02-16 (×3): 3 [IU] via SUBCUTANEOUS
  Administered 2014-02-16: 2 [IU] via SUBCUTANEOUS
  Administered 2014-02-16: 3 [IU] via SUBCUTANEOUS
  Administered 2014-02-17 (×2): 2 [IU] via SUBCUTANEOUS
  Administered 2014-02-17: 3 [IU] via SUBCUTANEOUS
  Administered 2014-02-18 (×2): 2 [IU] via SUBCUTANEOUS

## 2014-02-14 MED ORDER — SODIUM CHLORIDE 0.9 % IV SOLN
INTRAVENOUS | Status: AC
  Filled 2014-02-14: qty 1

## 2014-02-14 MED ORDER — METOPROLOL TARTRATE 25 MG PO TABS
25.0000 mg | ORAL_TABLET | Freq: Two times a day (BID) | ORAL | Status: DC
  Administered 2014-02-15 – 2014-02-18 (×7): 25 mg via ORAL
  Filled 2014-02-14 (×9): qty 1

## 2014-02-14 MED ORDER — GLYCOPYRROLATE 0.2 MG/ML IJ SOLN
INTRAMUSCULAR | Status: AC
  Filled 2014-02-14: qty 3

## 2014-02-14 MED ORDER — TAMSULOSIN HCL 0.4 MG PO CAPS: 0.4000 mg | ORAL_CAPSULE | Freq: Every day | ORAL | Status: DC

## 2014-02-14 MED ORDER — ACETAMINOPHEN 325 MG PO TABS: 650.0000 mg | ORAL_TABLET | ORAL | Status: DC | PRN

## 2014-02-14 MED ORDER — LISINOPRIL 5 MG PO TABS
5.0000 mg | ORAL_TABLET | Freq: Every morning | ORAL | Status: DC
  Administered 2014-02-15 – 2014-02-18 (×4): 5 mg via ORAL
  Filled 2014-02-14 (×4): qty 1

## 2014-02-14 MED ORDER — HYDROMORPHONE HCL PF 1 MG/ML IJ SOLN
1.0000 mg | INTRAMUSCULAR | Status: DC | PRN
  Administered 2014-02-14 – 2014-02-15 (×3): 1 mg via INTRAVENOUS
  Filled 2014-02-14 (×3): qty 1

## 2014-02-14 MED ORDER — GLYCOPYRROLATE 0.2 MG/ML IJ SOLN
INTRAMUSCULAR | Status: DC | PRN
  Administered 2014-02-14: 0.6 mg via INTRAVENOUS

## 2014-02-14 MED ORDER — PHENYLEPHRINE HCL 10 MG/ML IJ SOLN
INTRAMUSCULAR | Status: DC | PRN
  Administered 2014-02-14 (×2): 40 ug via INTRAVENOUS
  Administered 2014-02-14 (×2): 80 ug via INTRAVENOUS

## 2014-02-14 MED ORDER — SODIUM CHLORIDE 0.9 % IV SOLN
1.0000 g | INTRAVENOUS | Status: AC
  Administered 2014-02-14: 1 g via INTRAVENOUS
  Filled 2014-02-14: qty 1

## 2014-02-14 MED ORDER — ISOSORBIDE MONONITRATE 15 MG HALF TABLET
15.0000 mg | ORAL_TABLET | Freq: Every morning | ORAL | Status: DC
  Administered 2014-02-15 – 2014-02-18 (×4): 15 mg via ORAL
  Filled 2014-02-14 (×4): qty 1

## 2014-02-14 MED ORDER — EPHEDRINE SULFATE 50 MG/ML IJ SOLN
INTRAMUSCULAR | Status: AC
Start: 2014-02-14 — End: 2014-02-14
  Filled 2014-02-14: qty 1

## 2014-02-14 MED ORDER — PHENYLEPHRINE HCL 10 MG/ML IJ SOLN
INTRAMUSCULAR | Status: AC
  Filled 2014-02-14: qty 1

## 2014-02-14 MED ORDER — ALVIMOPAN 12 MG PO CAPS
12.0000 mg | ORAL_CAPSULE | Freq: Two times a day (BID) | ORAL | Status: DC
  Administered 2014-02-15 – 2014-02-17 (×6): 12 mg via ORAL
  Filled 2014-02-14 (×8): qty 1

## 2014-02-14 SURGICAL SUPPLY — 50 items
APPLICATOR COTTON TIP 6IN STRL (MISCELLANEOUS) ×2 IMPLANT
BLADE EXTENDED COATED 6.5IN (ELECTRODE) ×2 IMPLANT
BLADE HEX COATED 2.75 (ELECTRODE) ×6 IMPLANT
BLADE SURG SZ10 CARB STEEL (BLADE) IMPLANT
CHLORAPREP W/TINT 26ML (MISCELLANEOUS) ×3 IMPLANT
COUNTER NEEDLE 20 DBL MAG RED (NEEDLE) ×3 IMPLANT
COVER MAYO STAND STRL (DRAPES) ×6 IMPLANT
DRAPE LAPAROSCOPIC ABDOMINAL (DRAPES) ×3 IMPLANT
DRAPE LG THREE QUARTER DISP (DRAPES) ×1 IMPLANT
DRAPE UTILITY XL STRL (DRAPES) ×6 IMPLANT
DRAPE WARM FLUID 44X44 (DRAPE) ×3 IMPLANT
DRSG OPSITE POSTOP 4X10 (GAUZE/BANDAGES/DRESSINGS) IMPLANT
DRSG OPSITE POSTOP 4X6 (GAUZE/BANDAGES/DRESSINGS) IMPLANT
DRSG OPSITE POSTOP 4X8 (GAUZE/BANDAGES/DRESSINGS) ×2 IMPLANT
DRSG PAD ABDOMINAL 8X10 ST (GAUZE/BANDAGES/DRESSINGS) IMPLANT
ELECT REM PT RETURN 9FT ADLT (ELECTROSURGICAL) ×3
ELECTRODE REM PT RTRN 9FT ADLT (ELECTROSURGICAL) ×1 IMPLANT
GLOVE SURG ORTHO 8.0 STRL STRW (GLOVE) ×6 IMPLANT
GOWN STRL REUS W/TWL XL LVL3 (GOWN DISPOSABLE) ×22 IMPLANT
KIT BASIN OR (CUSTOM PROCEDURE TRAY) ×5 IMPLANT
LEGGING LITHOTOMY PAIR STRL (DRAPES) ×1 IMPLANT
LIGASURE IMPACT 36 18CM CVD LR (INSTRUMENTS) ×2 IMPLANT
LUBRICANT JELLY K Y 4OZ (MISCELLANEOUS) IMPLANT
PACK GENERAL/GYN (CUSTOM PROCEDURE TRAY) ×3 IMPLANT
PENCIL BUTTON HOLSTER BLD 10FT (ELECTRODE) ×3 IMPLANT
RELOAD PROXIMATE 75MM BLUE (ENDOMECHANICALS) ×3 IMPLANT
RELOAD STAPLE 75 3.8 BLU REG (ENDOMECHANICALS) IMPLANT
SPONGE GAUZE 4X4 12PLY (GAUZE/BANDAGES/DRESSINGS) IMPLANT
STAPLER GUN LINEAR PROX 60 (STAPLE) ×2 IMPLANT
STAPLER PROXIMATE 75MM BLUE (STAPLE) ×4 IMPLANT
STAPLER VISISTAT 35W (STAPLE) ×3 IMPLANT
SUCTION POOLE TIP (SUCTIONS) ×3 IMPLANT
SUT NOV 1 T60/GS (SUTURE) IMPLANT
SUT NOVA NAB DX-16 0-1 5-0 T12 (SUTURE) IMPLANT
SUT NOVA T20/GS 25 (SUTURE) ×2 IMPLANT
SUT PDS AB 1 TP1 96 (SUTURE) IMPLANT
SUT SILK 2 0 (SUTURE) ×6
SUT SILK 2 0 SH CR/8 (SUTURE) ×5 IMPLANT
SUT SILK 2 0SH CR/8 30 (SUTURE) IMPLANT
SUT SILK 2-0 18XBRD TIE 12 (SUTURE) ×1 IMPLANT
SUT SILK 2-0 30XBRD TIE 12 (SUTURE) ×1 IMPLANT
SUT SILK 3 0 (SUTURE) ×3
SUT SILK 3 0 SH CR/8 (SUTURE) ×3 IMPLANT
SUT SILK 3-0 18XBRD TIE 12 (SUTURE) ×1 IMPLANT
TOWEL OR 17X26 10 PK STRL BLUE (TOWEL DISPOSABLE) ×6 IMPLANT
TOWEL OR NON WOVEN STRL DISP B (DISPOSABLE) ×6 IMPLANT
TRAY FOLEY METER SIL LF 16FR (CATHETERS) ×2 IMPLANT
TUBING CONNECTING 10 (TUBING) IMPLANT
TUBING CONNECTING 10' (TUBING)
YANKAUER SUCT BULB TIP 10FT TU (MISCELLANEOUS) ×3 IMPLANT

## 2014-02-14 NOTE — Op Note (Signed)
NAMEIZEA, LIVOLSI NO.:  [id number]  MEDICAL RECORD NO.:  [id number]  LOCATION:  1884                         FACILITY:  [facility]  PHYSICIAN:  Earnstine Regal, MD      DATE OF BIRTH:  01-29-1936  DATE OF PROCEDURE:  02/14/2014                              OPERATIVE REPORT   PREOPERATIVE DIAGNOSIS:  Tubulovillous adenoma of the cecum.  POSTOPERATIVE DIAGNOSIS:  Tubulovillous adenoma of the cecum.  PROCEDURE:  Right colectomy.  SURGEON:  Earnstine Regal, MD, FACS  ANESTHESIA:  General.  ESTIMATED BLOOD LOSS:  Minimal.  PREPARATION:  ChloraPrep.  COMPLICATIONS:  None.  INDICATIONS:  The patient is a 78 year old male, referred by his gastroenterologist, Dr. Wilford Corner, with a tubulovillous adenoma of the cecum which was not resectable endoscopically.  The patient had undergone a routine screening colonoscopy.  He was found to have a large tubulovillous adenoma in the cecum.  Biopsy was taken and did not show dysplasia or malignancy.  However, the polyp was large and not amenable to endoscopic resection.  The patient now comes to Surgery for resection for definitive diagnosis and management.  BODY OF REPORT:  Procedure was done in OR #1 at the [facility].  The patient was brought to the operating room, placed in the supine position on the operating room table.  Following administration of general anesthesia, the patient was positioned and then prepped in the usual aseptic fashion.  After ascertaining that an adequate level of anesthesia had been achieved, a right mid abdominal transverse incision was made with a #10 blade.  Dissection was carried down to the fascia.  Anterior rectus fascia and external oblique fascia were incised with the electrocautery.  Rectus muscle was partially transected.  Posterior rectus fascia was exposed.  It was grasped between hemostats and incised sharply with a #10 blade.  Peritoneal cavity was entered  cautiously.  Fascial incision was then extended laterally and medially.  A Balfour retractor was placed for exposure.  Palpation reveals a mass at the ileocecal valve.  The colon was relatively mobile.  Palpation in the remainder of the abdomen including the liver shows no sign of metastatic disease.  Stomach appears normal. Small bowel appears normal.  Palpation in the pelvis shows no sign of neoplasm.  Using the electrocautery, the lateral peritoneal attachments of the right colon are incised, allowing for mobilization.  Dissection was carried around the cecum and around the appendix up to the terminal ileum.  Dissection was carried cephalad to the hepatic flexure.  This allows for mobilization of the entire right colon.  A point at the hepatic flexure was selected and transected with a GIA stapler. Mesentery was divided with the LigaSure and arterial vessels were oversewn with 2-0 silk figure-of-eight sutures.  Dissection was carried down to the cecum.  A point in the distal ileum was selected and transected with a GIA stapler.  Remaining mesentery was transected with the LigaSure.  Specimen was reviewed and then passed off the field and submitted to Pathology for evaluation.  Next, a side-to-side functionally end-to-end anastomosis was created between the terminal ileum and the proximal transverse colon.  This was performed  in a side-to-side fashion with a GIA stapler.  The staple line was inspected for hemostasis.  Enterotomy was closed with a TA-60 stapler.  Mesenteric defect was closed with interrupted 2-0 silk sutures.  Abdomen was irrigated with warm saline which was evacuated.  Good hemostasis was noted.  Gowns and gloves were changed at this point.  New drapes were placed around the surgical incision.  The wound was then closed in two layers with running #1 Novafil sutures.  Subcutaneous tissues were irrigated and hemostasis achieved with the electrocautery.  Skin was  closed with stainless steel staples.  Honeycomb dressing was applied.  The patient was awakened from anesthesia and brought to the recovery room.  The patient tolerated the procedure well.   Earnstine Regal, MD, [facility] Office: [phone]    TMG/MEDQ  D:  02/14/2014  T:  02/14/2014  Job:  [id number]  cc:   Lear Ng, MD Fax: [phone]  Shelva Majestic, M.D. Fax: [phone]

## 2014-02-14 NOTE — Interval H&P Note (Signed)
History and Physical Interval Note:  02/14/2014 8:40 AM  Lonnie Marshall  has presented today for surgery, with the diagnosis of tubulovillous adenoma of cecum.  The various methods of treatment have been discussed with the patient and family. After consideration of risks, benefits and other options for treatment, the patient has consented to    Procedure(s): RIGHT COLECTOMY (Right) as a surgical intervention .    The patient's history has been reviewed, patient examined, no change in status, stable for surgery.  I have reviewed the patient's chart and labs.  Questions were answered to the patient's satisfaction.    Earnstine Regal, MD, [facility] Office: [place]

## 2014-02-14 NOTE — Progress Notes (Signed)
Patient with heart rate of 59 blood pressure of 104/66.  Lopressor held, CBG 195 no hs coverage.  Dr Lucia Gaskins paged to discuss the above no order changes at this time

## 2014-02-14 NOTE — Anesthesia Postprocedure Evaluation (Signed)
Anesthesia Post Note  Patient: Lonnie Marshall  Procedure(s) Performed: Procedure(s) (LRB): RIGHT COLECTOMY (N/A)  Anesthesia type: General  Patient location: PACU  Post pain: Pain level controlled  Post assessment: Post-op Vital signs reviewed  Last Vitals: BP 162/81  Pulse 60  Temp(Src) 36.3 C (Oral)  Resp 18  Ht 5\' 10"  (1.778 m)  Wt 174 lb (78.926 kg)  BMI 24.97 kg/m2  SpO2 94%  Post vital signs: Reviewed  Level of consciousness: sedated  Complications: No apparent anesthesia complications

## 2014-02-14 NOTE — Transfer of Care (Signed)
Immediate Anesthesia Transfer of Care Note  Patient: Lonnie Marshall  Procedure(s) Performed: Procedure(s): RIGHT COLECTOMY (N/A)  Patient Location: PACU  Anesthesia Type:General  Level of Consciousness: awake, alert , oriented and patient cooperative  Airway & Oxygen Therapy: Patient Spontanous Breathing and Patient connected to face mask oxygen  Post-op Assessment: Report given to PACU RN, Post -op Vital signs reviewed and stable and Patient moving all extremities  Post vital signs: Reviewed and stable  Complications: No apparent anesthesia complications

## 2014-02-14 NOTE — H&P (View-Only) (Signed)
General Surgery [facility]  Chief Complaint  Patient presents with  . New Evaluation    tubulovillous adenoma of cecum - referral from Dr. Wilford Corner    HISTORY: Patient is a 78 year old male referred by his gastroenterologist for a tubulovillous adenoma of the cecum which is not resectable endoscopically. Patient was having a routine screening colonoscopy. He was approximately 2-3 years overdue from his prior procedures. He was found to have a large tubulovillous adenoma in the cecum. Biopsy showed no dysplasia or malignancy. Patient denies any bleeding correct. He has had no symptoms. He now presents to consider resection for definitive diagnosis and management.  Patient has had no prior abdominal surgery. He does have a significant history of coronary artery disease and has a pacemaker in place. He takes aspirin and Plavix.  Past Medical History  Diagnosis Date  . Hypertension   . S/P coronary artery stent placement, emergently to 100% occl. LAD with DES X 2.   02/28/2012  . HTN (hypertension) 02/28/2012  . STEMI (ST elevation myocardial infarction), [place]  02/27/2012  . Skull fracture 1942    "fell out of car; in hospital for some time"  . High cholesterol   . Overactive bladder   . Angina   . Pacemaker   . Sleep apnea     "suppose to have been tested 03/15/12"  . Arthritis   . Coronary artery disease   . Type II diabetes mellitus   . Kidney stones     "once"  . Benign localized hyperplasia of prostate with urinary obstruction 07/14/2013  . Ureteral obstruction 07/14/2013    Current Outpatient Prescriptions  Medication Sig Dispense Refill  . aspirin EC 81 MG tablet Take 1 tablet (81 mg total) by mouth every morning.      Marland Kitchen atorvastatin (LIPITOR) 80 MG tablet Take 40 mg by mouth daily at 6 PM.       . cholecalciferol (VITAMIN D) 1000 UNITS tablet Take 1,000 Units by mouth daily.      . clopidogrel (PLAVIX) 75 MG tablet Take 1 tablet (75 mg total) by  mouth every morning.      . docusate sodium (COLACE) 100 MG capsule Take 1 capsule (100 mg total) by mouth 2 (two) times daily.  10 capsule  0  . finasteride (PROSCAR) 5 MG tablet Take 5 mg by mouth daily.      . isosorbide mononitrate (IMDUR) 30 MG 24 hr tablet Take 15 mg by mouth daily.      Marland Kitchen lisinopril (PRINIVIL,ZESTRIL) 10 MG tablet Take 5 mg by mouth every morning.      Marland Kitchen METFORMIN HCL ER, MOD, PO Take by mouth.      . metoprolol (LOPRESSOR) 50 MG tablet Take 25 mg by mouth 2 (two) times daily.      . Omega-3 Fatty Acids (FISH OIL) 1200 MG CAPS Take 1,200 mg by mouth 2 (two) times daily.        No current facility-administered medications for this visit.    No Known Allergies  History reviewed. No pertinent family history.  History   Social History  . Marital Status: Married    Spouse Name: N/A    Number of Children: N/A  . Years of Education: N/A   Social History Main Topics  . Smoking status: Former Smoker -- 3.00 packs/day for 20 years    Types: Cigarettes    Quit date: 03/03/1975  . Smokeless tobacco: Current User    Types:  Chew     Comment: 03/17/12 "cutting down chew by 2/3 already"  . Alcohol Use: No  . Drug Use: No  . Sexual Activity: Not Currently   Other Topics Concern  . None   Social History Narrative  . None    REVIEW OF SYSTEMS - PERTINENT POSITIVES ONLY: Denies bleeding per rectum. Denies changes in bowel habits. Denies abdominal pain.  EXAMDanley Danker Vitals:   01/15/14 0951  BP: 112/68  Pulse: 72  Temp: 98.8 F (37.1 C)  Resp: 12    GENERAL: well-developed, well-nourished, no acute distress HEENT: normocephalic; pupils equal and reactive; sclerae clear; dentition good; mucous membranes moist NECK:  No palpable nodules in the thyroid bed; symmetric on extension; no palpable anterior or posterior cervical lymphadenopathy; no supraclavicular masses; no tenderness CHEST: clear to auscultation bilaterally without rales, rhonchi, or  wheezes CARDIAC: regular rate and rhythm without significant murmur; peripheral pulses are full ABDOMEN: soft without distension; bowel sounds present; no mass; no hepatosplenomegaly; no hernia EXT:  non-tender without edema; no deformity NEURO: no gross focal deficits; no sign of tremor   LABORATORY RESULTS: See [facility] (CHL-Epic) for most recent results  RADIOLOGY RESULTS: See [facility] (CHL-Epic) for most recent results  IMPRESSION: #1 tubulovillous adenoma of the cecum #2 personal history of coronary artery disease with stent placement and pacemaker #3 type 2 diabetes  PLAN: I discussed the above findings at length with the patient and his wife. I provided them with written literature to review at home. We discussed laparoscopic technique versus open surgery. We discussed the hospital stay to be anticipated. We discussed his postoperative recovery and return to normal activity. They understand and wish to proceed with surgery in the near future.  We will request clearance from cardiology prior to surgery.  The risks and benefits of the procedure have been discussed at length with the patient.  The patient understands the proposed procedure, potential alternative treatments, and the course of recovery to be expected.  All of the patient's questions have been answered at this time.  The patient wishes to proceed with surgery.  Earnstine Regal, MD, [facility]  Primary Care Physician: Abigail Miyamoto, MD

## 2014-02-14 NOTE — Brief Op Note (Signed)
02/14/2014  11:04 AM  PATIENT:  Lonnie Marshall  78 y.o. male  PRE-OPERATIVE DIAGNOSIS:  tubulovillous adenoma of cecum  POST-OPERATIVE DIAGNOSIS:  tubulovillous adenoma of cecum  PROCEDURE:  Procedure(s): RIGHT COLECTOMY (N/A)  SURGEON:  Surgeon(s) and Role:    * Earnstine Regal, MD - Primary  ANESTHESIA:   general  EBL:  Total I/O In: 1800 [I.V.:1800] Out: 175 [Urine:150; Blood:25]  BLOOD ADMINISTERED:none  DRAINS: none   LOCAL MEDICATIONS USED:  NONE  SPECIMEN:  Excision  DISPOSITION OF SPECIMEN:  PATHOLOGY  COUNTS:  YES  TOURNIQUET:  * No tourniquets in log *  DICTATION: .Other Dictation: Dictation Number [id number]  PLAN OF CARE: Admit to inpatient   PATIENT DISPOSITION:  PACU - hemodynamically stable.   Delay start of Pharmacological VTE agent (>24hrs) due to surgical blood loss or risk of bleeding: yes  Earnstine Regal, MD, [facility] Office: [phone]

## 2014-02-14 NOTE — Anesthesia Preprocedure Evaluation (Addendum)
Anesthesia Evaluation  Patient identified by MRN, date of birth, ID band Patient awake    Reviewed: Allergy & Precautions, H&P , NPO status , Patient's Chart, lab work & pertinent test results  History of Anesthesia Complications Negative for: history of anesthetic complications  Airway Mallampati: II TM Distance: >3 FB Neck ROM: Full    Dental no notable dental hx.    Pulmonary neg pulmonary ROS, sleep apnea , former smoker,  breath sounds clear to auscultation  Pulmonary exam normal       Cardiovascular Exercise Tolerance: Good hypertension, Pt. on medications and Pt. on home beta blockers + angina + CAD and + Past MI negative cardio ROS  + dysrhythmias + pacemaker Rhythm:Regular Rate:Normal  Denies recent chest pains.   Neuro/Psych  Neuromuscular disease negative psych ROS   GI/Hepatic negative GI ROS, Neg liver ROS,   Endo/Other  diabetes, Type 2, Oral Hypoglycemic Agents  Renal/GU Renal disease     Musculoskeletal negative musculoskeletal ROS (+)   Abdominal   Peds  Hematology negative hematology ROS (+)   Anesthesia Other Findings   Reproductive/Obstetrics                          Anesthesia Physical  Anesthesia Plan  ASA: III  Anesthesia Plan: General   Post-op Pain Management:    Induction: Intravenous  Airway Management Planned: Oral ETT  Additional Equipment:   Intra-op Plan:   Post-operative Plan: Extubation in OR  Informed Consent: I have reviewed the patients History and Physical, chart, labs and discussed the procedure including the risks, benefits and alternatives for the proposed anesthesia with the patient or authorized representative who has indicated his/her understanding and acceptance.   Dental advisory given  Plan Discussed with: CRNA  Anesthesia Plan Comments:         Anesthesia Quick Evaluation

## 2014-02-15 ENCOUNTER — Encounter (HOSPITAL_COMMUNITY): Payer: Self-pay | Admitting: Surgery

## 2014-02-15 LAB — GLUCOSE, CAPILLARY
Glucose-Capillary: 167 mg/dL — ABNORMAL HIGH (ref 70–99)
Glucose-Capillary: 179 mg/dL — ABNORMAL HIGH (ref 70–99)
Glucose-Capillary: 141 mg/dL — ABNORMAL HIGH (ref 70–99)
Glucose-Capillary: 146 mg/dL — ABNORMAL HIGH (ref 70–99)

## 2014-02-15 LAB — BASIC METABOLIC PANEL
BUN: 14 mg/dL (ref 6–23)
CO2: 27 mEq/L (ref 19–32)
CREATININE: 1.04 mg/dL (ref 0.50–1.35)
Calcium: 7.9 mg/dL — ABNORMAL LOW (ref 8.4–10.5)
Chloride: 101 mEq/L (ref 96–112)
GFR calc Af Amer: 78 mL/min — ABNORMAL LOW (ref >90)
GFR, EST NON AFRICAN AMERICAN: 68 mL/min — AB (ref >90)
Glucose, Bld: 148 mg/dL — ABNORMAL HIGH (ref 70–99)
POTASSIUM: 4.3 meq/L (ref 3.7–5.3)
Sodium: 137 mEq/L (ref 137–147)

## 2014-02-15 LAB — CBC
HEMATOCRIT: 36.8 % — AB (ref 39.0–52.0)
Hemoglobin: 13.4 g/dL (ref 13.0–17.0)
MCH: 32.4 pg (ref 26.0–34.0)
MCHC: 36.4 g/dL — AB (ref 30.0–36.0)
MCV: 89.1 fL (ref 78.0–100.0)
Platelets: 259 10*3/uL (ref 150–400)
RBC: 4.13 MIL/uL — ABNORMAL LOW (ref 4.22–5.81)
RDW: 13.1 % (ref 11.5–15.5)
WBC: 8.8 10*3/uL (ref 4.0–10.5)

## 2014-02-15 NOTE — Progress Notes (Signed)
[name], male   DOB: Mar 20, 1936, 78 y.o.   MRN: [id number]  [facility] - Progress Note  POD# 1  Subjective: Patient awake and alert.  "Sore", otherwise no complaints.  Tolerating clear liquids.  Ambulated.  Wife at bedside.  Objective: Vital signs in last 24 hours: Temp:  [97.4 F (36.3 C)-98.6 F (37 C)] 98.1 F (36.7 C) (04/10 0543) Pulse Rate:  [59-60] 60 (04/10 0543) Resp:  [10-18] 18 (04/10 0543) BP: (99-171)/(62-82) 137/71 mmHg (04/10 0543) SpO2:  [92 %-100 %] 94 % (04/10 0543) Weight:  [174 lb (78.926 kg)] 174 lb (78.926 kg) (04/09 1300) Last BM Date: 02/13/14  Intake/Output from previous day: 04/09 0701 - 04/10 0700 In: 4335 [P.O.:1340; I.V.:2995] Out: 2525 [Urine:2500; Blood:25]  Exam: HEENT - clear, not icteric Neck - soft Chest - clear bilaterally Cor - RRR, no murmur Abd - soft, mild distension; BS present; wound clear and dry with honeycomb dressing Ext - no significant edema Neuro - grossly intact, no focal deficits  Lab Results:   Recent Labs  02/14/14 1407 02/15/14 0443  WBC 11.7* 8.8  HGB 13.7 13.4  HCT 39.3 36.8*  PLT 245 259     Recent Labs  02/14/14 1407 02/15/14 0443  NA  --  137  K  --  4.3  CL  --  101  CO2  --  27  GLUCOSE  --  148*  BUN  --  14  CREATININE 0.93 1.04  CALCIUM  --  7.9*    Studies/Results: No results found.  Assessment / Plan: 1.  Status post right colectomy  Await pathology results  Encourage ambulation, IS use  Continue clear liquid diet today  Earnstine Regal, MD, [facility] Office: [phone]  02/15/2014

## 2014-02-15 NOTE — Care Management Note (Signed)
    Page 1 of 1   02/15/2014     11:05:10 AM   CARE MANAGEMENT NOTE 02/15/2014  Patient:  Lonnie Marshall,Lonnie Marshall   Account Number:  [id number]  Date Initiated:  02/15/2014  Documentation initiated by:  Sunday Spillers  Subjective/Objective Assessment:   78 yo male admitted s/p colectomy. PTA lived at home with spouse.     Action/Plan:   Home when stable   Anticipated DC Date:  02/15/2014   Anticipated DC Plan:  [place]  CM consult      Choice offered to / List presented to:             Status of service:  Completed, signed off Medicare Important Message given?   (If response is "NO", the following Medicare IM given date fields will be blank) Date Medicare IM given:   Date Additional Medicare IM given:    Discharge Disposition:  HOME/SELF CARE  Per UR Regulation:  Reviewed for med. necessity/level of care/duration of stay  If discussed at [place] of Stay Meetings, dates discussed:    Comments:

## 2014-02-16 LAB — GLUCOSE, CAPILLARY
Glucose-Capillary: 159 mg/dL — ABNORMAL HIGH (ref 70–99)
Glucose-Capillary: 151 mg/dL — ABNORMAL HIGH (ref 70–99)
Glucose-Capillary: 138 mg/dL — ABNORMAL HIGH (ref 70–99)

## 2014-02-16 LAB — BASIC METABOLIC PANEL
BUN: 10 mg/dL (ref 6–23)
CALCIUM: 8.1 mg/dL — AB (ref 8.4–10.5)
CO2: 23 mEq/L (ref 19–32)
Chloride: 104 mEq/L (ref 96–112)
Creatinine, Ser: 0.93 mg/dL (ref 0.50–1.35)
GFR calc Af Amer: >90 mL/min (ref >90)
GFR, EST NON AFRICAN AMERICAN: 80 mL/min — AB (ref >90)
Glucose, Bld: 161 mg/dL — ABNORMAL HIGH (ref 70–99)
Potassium: 4.5 mEq/L (ref 3.7–5.3)
Sodium: 138 mEq/L (ref 137–147)

## 2014-02-16 LAB — CBC
HEMATOCRIT: 39.6 % (ref 39.0–52.0)
Hemoglobin: 13.2 g/dL (ref 13.0–17.0)
MCH: 30.3 pg (ref 26.0–34.0)
MCHC: 33.3 g/dL (ref 30.0–36.0)
MCV: 91 fL (ref 78.0–100.0)
PLATELETS: 250 10*3/uL (ref 150–400)
RBC: 4.35 MIL/uL (ref 4.22–5.81)
RDW: 13.5 % (ref 11.5–15.5)
WBC: 7.8 10*3/uL (ref 4.0–10.5)

## 2014-02-16 MED ORDER — ALUM & MAG HYDROXIDE-SIMETH 200-200-20 MG/5ML PO SUSP
30.0000 mL | ORAL | Status: DC | PRN
  Administered 2014-02-16 – 2014-02-17 (×3): 30 mL via ORAL
  Filled 2014-02-16 (×3): qty 30

## 2014-02-16 NOTE — Progress Notes (Signed)
[name], male   DOB: [birth year], 78 y.o.   MRN: [id number]  [facility] - Progress Note  POD# 2  Subjective: Patient up at bedside.  Ambulated this AM.  Mild nausea last night after pain Rx. No flatus or BM yet.  Does not like clear liquids - too salty.  Objective: Vital signs in last 24 hours: Temp:  [97.4 F (36.3 C)-99 F (37.2 C)] 98 F (36.7 C) (04/11 0550) Pulse Rate:  [60-65] 62 (04/11 0550) Resp:  [17-18] 18 (04/11 0550) BP: (124-143)/(67-73) 142/69 mmHg (04/11 0550) SpO2:  [93 %-96 %] 95 % (04/11 0550) Last BM Date: 02/13/14  Intake/Output from previous day: 04/10 0701 - 04/11 0700 In: 1800 [I.V.:1800] Out: 2500 [Urine:2500]  Exam: HEENT - clear, not icteric Neck - soft Chest - clear bilaterally Cor - RRR, no murmur Abd - mild distension; quiet; wound clear and dry Ext - no significant edema Neuro - grossly intact, no focal deficits  Lab Results:   Recent Labs  02/15/14 0443 02/16/14 0528  WBC 8.8 7.8  HGB 13.4 13.2  HCT 36.8* 39.6  PLT 259 250     Recent Labs  02/15/14 0443 02/16/14 0528  NA 137 138  K 4.3 4.5  CL 101 104  CO2 27 23  GLUCOSE 148* 161*  BUN 14 10  CREATININE 1.04 0.93  CALCIUM 7.9* 8.1*    Studies/Results: No results found.  Assessment / Plan: 1.  Status post right colectomy  Pathology shows TV adenoma - no malignancy  Advance to full liquid diet  Continue ambulation, IS use  Continue [place], MD, [facility] Office: [phone]  02/16/2014

## 2014-02-17 LAB — GLUCOSE, CAPILLARY
GLUCOSE-CAPILLARY: 151 mg/dL — AB (ref 70–99)
GLUCOSE-CAPILLARY: 147 mg/dL — AB (ref 70–99)
GLUCOSE-CAPILLARY: 132 mg/dL — AB (ref 70–99)
Glucose-Capillary: 118 mg/dL — ABNORMAL HIGH (ref 70–99)
Glucose-Capillary: 133 mg/dL — ABNORMAL HIGH (ref 70–99)

## 2014-02-17 NOTE — Progress Notes (Signed)
[name], male   DOB: December 04, 1935, 78 y.o.   MRN: [id number]  [facility] - Progress Note  POD# 3  Subjective: Patient ambulating in halls.  Feels like BM is coming soon.  No nausea or emesis.  Tolerating full liquid diet.  Objective: Vital signs in last 24 hours: Temp:  [98 F (36.7 C)-98.8 F (37.1 C)] 98 F (36.7 C) (04/12 0626) Pulse Rate:  [60-66] 60 (04/12 0626) Resp:  [16-18] 16 (04/12 0626) BP: (128-144)/(54-76) 128/74 mmHg (04/12 0626) SpO2:  [93 %-97 %] 93 % (04/12 0626) Last BM Date: 02/13/14  Intake/Output from previous day: 04/11 0701 - 04/12 0700 In: 1800 [I.V.:1800] Out: 3100 [Urine:3100]  Exam: HEENT - clear, not icteric Neck - soft Chest - clear bilaterally Cor - RRR, no murmur Abd - softer, less distended; wound clear and dry Ext - no significant edema Neuro - grossly intact, no focal deficits  Lab Results:   Recent Labs  02/15/14 0443 02/16/14 0528  WBC 8.8 7.8  HGB 13.4 13.2  HCT 36.8* 39.6  PLT 259 250     Recent Labs  02/15/14 0443 02/16/14 0528  NA 137 138  K 4.3 4.5  CL 101 104  CO2 27 23  GLUCOSE 148* 161*  BUN 14 10  CREATININE 1.04 0.93  CALCIUM 7.9* 8.1*    Studies/Results: No results found.  Assessment / Plan:  1. Status post right colectomy   Pathology shows TV adenoma - no malignancy   Full liquid diet   Continue ambulation, IS use   Continue [place], MD, [facility] Office: [phone]  02/17/2014

## 2014-02-18 LAB — GLUCOSE, CAPILLARY
GLUCOSE-CAPILLARY: 134 mg/dL — AB (ref 70–99)
Glucose-Capillary: 130 mg/dL — ABNORMAL HIGH (ref 70–99)

## 2014-02-18 MED ORDER — HYDROCODONE-ACETAMINOPHEN 5-325 MG PO TABS: 1.0000 | ORAL_TABLET | ORAL | Status: DC | PRN

## 2014-02-18 NOTE — Discharge Summary (Signed)
Physician Discharge Summary [facility]  [name] MRN: [id number] DOB/AGE: [birth year] 76 y.o.  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  Post op course uncomplicated.  Gradual resolution of ileus and advancement of diet.  Return of bowel function with bowel movement on POD#3.  Prepared for discharge home on POD#4.  Final pathology benign.  Consults: None  Significant Diagnostic Studies: none  Treatments: surgery: right colectomy  Discharge Exam: Blood pressure 136/81, pulse 60, temperature 98.7 F (37.1 C), temperature source Oral, resp. rate 16, height 5\' 10"  (1.778 m), weight 174 lb (78.926 kg), SpO2 94.00%. HEENT - clear Neck - soft Chest - clear bilaterally Cor - RRR Abd - right abdominal wound healing nicely, clear, dry, and intact  Disposition: Home with family  Discharge Orders   Future Appointments Provider Department Dept Phone   02/25/2014 11:00 AM Earnstine Regal, MD [facility] [place]   03/11/2014 9:35 AM [facility] [place] Office [phone]   Future Orders Complete By Expires   Diet - low sodium heart healthy  As directed    Discharge instructions  As directed    Increase activity slowly  As directed    No dressing needed  As directed        Medication List         aspirin EC 81 MG tablet  Take 1 tablet (81 mg total) by mouth every morning.     atorvastatin 80 MG tablet  Commonly known as:  LIPITOR  Take 40 mg by mouth daily at 6 PM.     cholecalciferol 1000 UNITS tablet  Commonly known as:  VITAMIN D  Take 1,000 Units by mouth daily.     clopidogrel 75 MG  tablet  Commonly known as:  PLAVIX  Take 1 tablet (75 mg total) by mouth every morning.     diphenhydrAMINE 25 MG tablet  Commonly known as:  BENADRYL  Take 25 mg by mouth every 6 (six) hours as needed for itching.     finasteride 5 MG tablet  Commonly known as:  PROSCAR  Take 5 mg by mouth daily.     Fish Oil 1200 MG Caps  Take 1,200 mg by mouth 2 (two) times daily.     HYDROcodone-acetaminophen 5-325 MG per tablet  Commonly known as:  NORCO/VICODIN  Take 1-2 tablets by mouth every 4 (four) hours as needed for moderate pain.     isosorbide mononitrate 30 MG 24 hr tablet  Commonly known as:  IMDUR  Take 15 mg by mouth every morning.     lisinopril 10 MG tablet  Commonly known as:  PRINIVIL,ZESTRIL  Take 5 mg by mouth every morning.     metFORMIN 500 MG (MOD) 24 hr tablet  Commonly known as:  GLUMETZA  Take 500 mg by mouth daily with breakfast.     metoprolol 50 MG tablet  Commonly known as:  LOPRESSOR  Take 25 mg by mouth 2 (two) times daily.     tamsulosin 0.4 MG Caps capsule  Commonly known as:  FLOMAX  Take 0.4 mg by mouth daily.           Follow-up Information   Follow up  with Earnstine Regal, MD In 1 week. (For suture removal)    Specialty:  General Surgery   Contact information:   [address] [place] [address] [phone]       Earnstine Regal, MD, [facility] Office: [phone]   Signed: Merlinda Frederick Revella Shelton 02/18/2014, 7:57 AM

## 2014-02-25 ENCOUNTER — Ambulatory Visit (INDEPENDENT_AMBULATORY_CARE_PROVIDER_SITE_OTHER): Payer: Medicare Other | Admitting: Surgery

## 2014-02-25 ENCOUNTER — Encounter (INDEPENDENT_AMBULATORY_CARE_PROVIDER_SITE_OTHER): Payer: Self-pay | Admitting: Surgery

## 2014-02-25 VITALS — BP 126/82 | HR 73 | Temp 97.2°F | Resp 16 | Ht 69.0 in | Wt 164.8 lb

## 2014-02-25 DIAGNOSIS — D126 Benign neoplasm of colon, unspecified: Secondary | ICD-10-CM

## 2014-02-25 NOTE — Patient Instructions (Signed)
  CARE OF INCISION   Apply cocoa butter/vitamin E cream (Palmer's brand) to your incision 2 - 3 times daily.  Massage cream into incision for one minute with each application.  Use sunscreen (50 SPF or higher) for first 6 months after surgery if area is exposed to sun.  You may alternate Mederma or other scar reducing cream with cocoa butter cream if desired.       Shawneen Deetz M. Argil Mahl, MD, FACS      [facility]      Office: [phone]    

## 2014-02-25 NOTE — Progress Notes (Signed)
General Surgery [facility]  Chief Complaint  Patient presents with  . Routine Post Op    right colectomy on 02/14/2014    HISTORY: Patient is a 78 year old male who underwent right colectomy for tubulovillous adenoma with high-grade dysplasia. No invasive malignancy was identified. 13 lymph nodes were negative for metastatic disease.  Patient has done well since discharge. He is tolerating a regular diet. He is having one formed bowel movement daily. He denies abdominal pain. He is anxious to return to his normal activities.  EXAM: Surgical wound is healing nicely. Staples were removed and benzoin and Steri-Strips are applied. No sign of infection. No sign of herniation.  IMPRESSION: Status post right colectomy for tubulovillous adenoma with high-grade dysplasia  PLAN: Patient will begin applying topical creams to his incisions once the Steri-Strips are removed. He will begin limited physical activity. He will avoid heavy lifting.  Patient will return in 6 weeks for final wound check.  Earnstine Regal, MD, [facility]   Visit Diagnoses: 1. Tubulovillous adenoma of colon, cecum

## 2014-03-11 ENCOUNTER — Encounter: Payer: Self-pay | Admitting: Cardiovascular Disease

## 2014-03-11 ENCOUNTER — Ambulatory Visit (INDEPENDENT_AMBULATORY_CARE_PROVIDER_SITE_OTHER): Payer: Medicare Other | Admitting: *Deleted

## 2014-03-11 DIAGNOSIS — I441 Atrioventricular block, second degree: Secondary | ICD-10-CM

## 2014-03-11 LAB — MDC_IDC_ENUM_SESS_TYPE_REMOTE
Battery Remaining Longevity: 98 mo
Battery Voltage: 2.96 V
Brady Statistic RA Percent Paced: 39 %
Date Time Interrogation Session: 20150504064050
Implantable Pulse Generator Model: 2210
Implantable Pulse Generator Serial Number: 7336473
Lead Channel Impedance Value: 430 Ohm
Lead Channel Pacing Threshold Amplitude: 1 V
Lead Channel Pacing Threshold Amplitude: 1 V
Lead Channel Pacing Threshold Pulse Width: 0.4 ms
Lead Channel Pacing Threshold Pulse Width: 0.4 ms
Lead Channel Sensing Intrinsic Amplitude: 11.5 mV
Lead Channel Sensing Intrinsic Amplitude: 4.7 mV
Lead Channel Setting Pacing Amplitude: 2.5 V
Lead Channel Setting Pacing Pulse Width: 0.4 ms
Lead Channel Setting Sensing Sensitivity: 2 mV
MDC IDC MSMT LEADCHNL RV IMPEDANCE VALUE: 430 Ohm
MDC IDC SET LEADCHNL RV PACING AMPLITUDE: 2.5 V
MDC IDC STAT BRADY AP VP PERCENT: 3.8 %
MDC IDC STAT BRADY AP VS PERCENT: 36 %
MDC IDC STAT BRADY AS VP PERCENT: 1 %
MDC IDC STAT BRADY AS VS PERCENT: 60 %
MDC IDC STAT BRADY RV PERCENT PACED: 4.6 %

## 2014-03-14 ENCOUNTER — Encounter (INDEPENDENT_AMBULATORY_CARE_PROVIDER_SITE_OTHER): Payer: Medicare Other | Admitting: Surgery

## 2014-03-19 ENCOUNTER — Encounter: Payer: Self-pay | Admitting: Cardiology

## 2014-03-22 NOTE — Progress Notes (Signed)
Remote pacemaker transmission.   

## 2014-04-10 ENCOUNTER — Encounter (INDEPENDENT_AMBULATORY_CARE_PROVIDER_SITE_OTHER): Payer: Medicare Other | Admitting: Surgery

## 2014-05-13 ENCOUNTER — Ambulatory Visit (INDEPENDENT_AMBULATORY_CARE_PROVIDER_SITE_OTHER): Payer: Medicare Other | Admitting: Surgery

## 2014-05-13 ENCOUNTER — Encounter (INDEPENDENT_AMBULATORY_CARE_PROVIDER_SITE_OTHER): Payer: Self-pay | Admitting: Surgery

## 2014-05-13 VITALS — BP 126/80 | HR 60 | Temp 98.5°F | Ht 68.0 in | Wt 163.0 lb

## 2014-05-13 DIAGNOSIS — D126 Benign neoplasm of colon, unspecified: Secondary | ICD-10-CM

## 2014-05-13 NOTE — Progress Notes (Signed)
General Surgery [facility]  Chief Complaint  Patient presents with  . Routine Post Op    right colectomy 02/14/2014    HISTORY: Patient is a 78 year old male who underwent right colectomy for tubulovillous adenoma of the cecum on 02/14/2014. He returns today for final postoperative visit.  Patient is anxious to return to full activity. He has been riding his tractor and cutting grass. He is having a normal formed bowel movement once daily. He denies any abdominal pain.  EXAM: Surgical incision is well-healed. Abdomen is soft and nontender. There is no sign of herniation. There is a palpable suture tag at the lateral extent of the incision. This is nontender.  IMPRESSION: Status post right colectomy for tubulovillous adenoma of the cecum  PLAN: Patient is released to full activity without restriction. If the suture tag at the lateral extent of the incision become symptomatic, we can remove it under local anesthesia here in the office.  Patient will return for surgical care as needed.  Earnstine Regal, MD, [facility]   Visit Diagnoses: 1. Tubulovillous adenoma of colon, cecum

## 2014-06-12 ENCOUNTER — Ambulatory Visit (INDEPENDENT_AMBULATORY_CARE_PROVIDER_SITE_OTHER): Payer: Medicare Other | Admitting: *Deleted

## 2014-06-12 DIAGNOSIS — I441 Atrioventricular block, second degree: Secondary | ICD-10-CM

## 2014-06-12 LAB — MDC_IDC_ENUM_SESS_TYPE_REMOTE
Battery Remaining Longevity: 98 mo
Battery Remaining Percentage: 82 %
Battery Voltage: 2.96 V
Brady Statistic AP VS Percent: 36 %
Brady Statistic AS VS Percent: 59 %
Brady Statistic RA Percent Paced: 39 %
Brady Statistic RV Percent Paced: 4.3 %
Implantable Pulse Generator Model: 2210
Implantable Pulse Generator Serial Number: 7336473
Lead Channel Impedance Value: 430 Ohm
Lead Channel Pacing Threshold Amplitude: 1 V
Lead Channel Pacing Threshold Amplitude: 1 V
Lead Channel Pacing Threshold Pulse Width: 0.4 ms
Lead Channel Sensing Intrinsic Amplitude: 2.8 mV
Lead Channel Setting Pacing Amplitude: 2.5 V
Lead Channel Setting Pacing Amplitude: 2.5 V
MDC IDC MSMT LEADCHNL RV IMPEDANCE VALUE: 430 Ohm
MDC IDC MSMT LEADCHNL RV PACING THRESHOLD PULSEWIDTH: 0.4 ms
MDC IDC MSMT LEADCHNL RV SENSING INTR AMPL: 12 mV
MDC IDC SESS DTM: 20150805071207
MDC IDC SET LEADCHNL RV PACING PULSEWIDTH: 0.4 ms
MDC IDC SET LEADCHNL RV SENSING SENSITIVITY: 2 mV
MDC IDC STAT BRADY AP VP PERCENT: 3.6 %
MDC IDC STAT BRADY AS VP PERCENT: 1 %

## 2014-06-12 NOTE — Progress Notes (Signed)
Remote pacemaker transmission.   

## 2014-07-02 ENCOUNTER — Encounter: Payer: Self-pay | Admitting: Cardiology

## 2014-07-08 ENCOUNTER — Encounter: Payer: Self-pay | Admitting: Cardiovascular Disease

## 2014-07-18 ENCOUNTER — Telehealth: Payer: Self-pay | Admitting: Cardiovascular Disease

## 2014-07-22 NOTE — Telephone Encounter (Signed)
Closed encounter °

## 2014-10-01 ENCOUNTER — Ambulatory Visit (INDEPENDENT_AMBULATORY_CARE_PROVIDER_SITE_OTHER): Payer: Medicare Other | Admitting: Cardiovascular Disease

## 2014-10-01 ENCOUNTER — Encounter: Payer: Self-pay | Admitting: Cardiovascular Disease

## 2014-10-01 VITALS — BP 122/68 | HR 60 | Resp 16 | Ht 70.0 in | Wt 172.2 lb

## 2014-10-01 DIAGNOSIS — R001 Bradycardia, unspecified: Secondary | ICD-10-CM

## 2014-10-01 DIAGNOSIS — Z950 Presence of cardiac pacemaker: Secondary | ICD-10-CM

## 2014-10-01 DIAGNOSIS — E785 Hyperlipidemia, unspecified: Secondary | ICD-10-CM

## 2014-10-01 DIAGNOSIS — I10 Essential (primary) hypertension: Secondary | ICD-10-CM

## 2014-10-01 DIAGNOSIS — I441 Atrioventricular block, second degree: Secondary | ICD-10-CM

## 2014-10-01 DIAGNOSIS — I2510 Atherosclerotic heart disease of native coronary artery without angina pectoris: Secondary | ICD-10-CM

## 2014-10-01 DIAGNOSIS — I471 Supraventricular tachycardia: Secondary | ICD-10-CM

## 2014-10-01 LAB — MDC_IDC_ENUM_SESS_TYPE_INCLINIC
Battery Voltage: 2.95 V
Brady Statistic RA Percent Paced: 43 %
Brady Statistic RV Percent Paced: 4.2 %
Date Time Interrogation Session: 20151124101759
Implantable Pulse Generator Model: 2210
Implantable Pulse Generator Serial Number: 7336473
Lead Channel Impedance Value: 425 Ohm
Lead Channel Impedance Value: 475 Ohm
Lead Channel Pacing Threshold Amplitude: 1 V
Lead Channel Pacing Threshold Pulse Width: 0.4 ms
Lead Channel Pacing Threshold Pulse Width: 0.4 ms
Lead Channel Pacing Threshold Pulse Width: 0.4 ms
Lead Channel Sensing Intrinsic Amplitude: 12 mV
Lead Channel Setting Sensing Sensitivity: 2 mV
MDC IDC MSMT BATTERY REMAINING LONGEVITY: 121.2 mo
MDC IDC MSMT LEADCHNL RA PACING THRESHOLD AMPLITUDE: 1 V
MDC IDC MSMT LEADCHNL RA PACING THRESHOLD PULSEWIDTH: 0.4 ms
MDC IDC MSMT LEADCHNL RA SENSING INTR AMPL: 5 mV
MDC IDC MSMT LEADCHNL RV PACING THRESHOLD AMPLITUDE: 1.25 V
MDC IDC MSMT LEADCHNL RV PACING THRESHOLD AMPLITUDE: 1.25 V
MDC IDC SET LEADCHNL RA PACING AMPLITUDE: 2 V
MDC IDC SET LEADCHNL RV PACING AMPLITUDE: 2.5 V
MDC IDC SET LEADCHNL RV PACING PULSEWIDTH: 0.4 ms

## 2014-10-01 NOTE — Progress Notes (Signed)
[name], male   DOB: 1936/08/19, 78 y.o.   MRN: [id number]     Reason for office visit CAD, pacemaker follow up  Lonnie Marshall has history of both sinus node dysfunction and second-degree atrioventricular block Mobitz type I. He received a pacemaker roughly two and a half years ago and it has been functioning normally. He has a history of coronary disease he had ST segment elevation myocardial infarction around the time of pacemaker implantation.  He denies any current cardiovascular complaints. Dizziness, which had plagued him in the past, is not prominent today. He had laser prostate surgery last September and then had right colectomy for tubulovillous adenoma of the cecum on 02/14/2014.   His wife had a recent femoral fracture and he is taking care of her at home.  Pacemaker interrogation has been performed via remote follow-up. In office check today shows normal device function. Device longevity is estimated to be roughly 8-10 years. He has roughly 43% atrial pacing and less than 5% ventricular pacing. He has extremely few episodes of mode switch only one of which lasted more than a few seconds.lead parameters are good.  Allergies  Allergen Reactions  . Other Rash    Plastic-itching    Current Outpatient Prescriptions  Medication Sig Dispense Refill  . aspirin EC 81 MG tablet Take 1 tablet (81 mg total) by mouth every morning.    Marland Kitchen atorvastatin (LIPITOR) 80 MG tablet Take 40 mg by mouth daily at 6 PM.     . cholecalciferol (VITAMIN D) 1000 UNITS tablet Take 1,000 Units by mouth daily.    . clopidogrel (PLAVIX) 75 MG tablet Take 1 tablet (75 mg total) by mouth every morning.    . diphenhydrAMINE (BENADRYL) 25 MG tablet Take 25 mg by mouth every 6 (six) hours as needed for itching.    . finasteride (PROSCAR) 5 MG tablet Take 5 mg by mouth daily.    . isosorbide mononitrate (IMDUR) 30 MG 24 hr tablet Take 15 mg by mouth every morning.     Marland Kitchen lisinopril (PRINIVIL,ZESTRIL)  10 MG tablet Take 5 mg by mouth every morning.    . metFORMIN (GLUMETZA) 500 MG (MOD) 24 hr tablet Take 1,000 mg by mouth daily with breakfast.     . metoprolol (LOPRESSOR) 50 MG tablet Take 25 mg by mouth 2 (two) times daily.    . Omega-3 Fatty Acids (FISH OIL) 1200 MG CAPS Take 1,200 mg by mouth 2 (two) times daily.      No current facility-administered medications for this visit.    Past Medical History  Diagnosis Date  . S/P coronary artery stent placement, emergently to 100% occl. LAD with DES X 2.   02/28/2012  . STEMI (ST elevation myocardial infarction), [facility]  02/27/2012  . Skull fracture 1942    "fell out of car; in hospital for some time"  . High cholesterol   . Overactive bladder   . Pacemaker   . Sleep apnea     "suppose to have been tested 03/15/12"  . Arthritis   . Coronary artery disease   . Type II diabetes mellitus   . Kidney stones     "once"  . Benign localized hyperplasia of prostate with urinary obstruction 07/14/2013  . Ureteral obstruction 07/14/2013  . Hypertension   . Complication of anesthesia     time before last- confused    Past Surgical History  Procedure Laterality Date  . Circumcision  1980's  . Insert /  replace / remove pacemaker  03/17/12    "first one"  . Lumbar disc surgery  ~ 1985  . Back surgery    . Cardiac catheterization  02/27/12    "1"  . Coronary angioplasty with stent placement  03/01/12    "+2=3 total"  . Cystoscopy with retrograde pyelogram, ureteroscopy and stent placement Left 07/13/2013    Procedure: CYSTOSCOPY WITH LEFT RETROGRADE PYELOGRAM, LEFT URETEROSCOPY AND left STENT PLACEMENT, cystogram;  Surgeon: Fredricka Bonine, MD;  Location: [facility];  Service: Urology;  Laterality: Left;  . Transurethral resection of prostate N/A 07/13/2013    Procedure: TRANSURETHRAL RESECTION OF THE PROSTATE (TURP);  Surgeon: Fredricka Bonine, MD;  Location: [facility];  Service: Urology;  Laterality: N/A;  . Vasectomy  ~78 years old     same time as circumcision  . Partial colectomy N/A 02/14/2014    Procedure: RIGHT COLECTOMY;  Surgeon: Earnstine Regal, MD;  Location: [facility];  Service: General;  Laterality: N/A;    No family history pertinent to current visit.  History   Social History  . Marital Status: Married    Spouse Name: N/A    Number of Children: N/A  . Years of Education: N/A   Occupational History  . Not on file.   Social History Main Topics  . Smoking status: Former Smoker -- 3.00 packs/day for 20 years    Types: Cigarettes    Quit date: 03/03/1975  . Smokeless tobacco: Current User    Types: Chew  . Alcohol Use: No  . Drug Use: No  . Sexual Activity: Not Currently   Other Topics Concern  . Not on file   Social History Narrative    Review of systems: The patient specifically denies any chest pain at rest or with exertion, dyspnea at rest or with exertion, orthopnea, paroxysmal nocturnal dyspnea, syncope, palpitations, focal neurological deficits, intermittent claudication, lower extremity edema, unexplained weight gain, cough, hemoptysis or wheezing.  The patient also denies abdominal pain, nausea, vomiting, dysphagia, diarrhea, constipation, polyuria, polydipsia, dysuria, hematuria, frequency, urgency, abnormal bleeding or bruising, fever, chills, unexpected weight changes, mood swings, change in skin or hair texture, change in voice quality, auditory or visual problems, allergic reactions or rashes, new musculoskeletal complaints other than usual "aches and pains".   PHYSICAL EXAM BP 122/68 mmHg  Pulse 60  Resp 16  Ht 5\' 10"  (1.778 m)  Wt 172 lb 3.2 oz (78.109 kg)  BMI 24.71 kg/m2  General: Alert, oriented x3, no distress Head: no evidence of trauma, PERRL, EOMI, no exophtalmos or lid lag, no myxedema, no xanthelasma; normal ears, nose and oropharynx Neck: normal jugular venous pulsations and no hepatojugular reflux; brisk carotid pulses without delay and no carotid bruits Chest: clear to  auscultation, no signs of consolidation by percussion or palpation, normal fremitus, symmetrical and full respiratory excursions; healthy pacemaker site Cardiovascular: normal position and quality of the apical impulse, regular rhythm, normal first and second heart sounds, no murmurs, rubs or gallops Abdomen: no tenderness or distention, no masses by palpation, no abnormal pulsatility or arterial bruits, normal bowel sounds, no hepatosplenomegaly Extremities: no clubbing, cyanosis or edema; 2+ radial, ulnar and brachial pulses bilaterally; 2+ right femoral, posterior tibial and dorsalis pedis pulses; 2+ left femoral, posterior tibial and dorsalis pedis pulses; no subclavian or femoral bruits Neurological: grossly nonfocal   EKG: atrial paced, ventricular sensed rhythm, otherwise normal  Lipid Panel     Component Value Date/Time   CHOL 169 02/28/2012 0857  TRIG 104 02/28/2012 0857   HDL 41 02/28/2012 0857   CHOLHDL 4.1 02/28/2012 0857   VLDL 21 02/28/2012 0857   LDLCALC 107* 02/28/2012 0857    BMET    Component Value Date/Time   NA 138 02/16/2014 0528   K 4.5 02/16/2014 0528   CL 104 02/16/2014 0528   CO2 23 02/16/2014 0528   GLUCOSE 161* 02/16/2014 0528   BUN 10 02/16/2014 0528   CREATININE 0.93 02/16/2014 0528   CALCIUM 8.1* 02/16/2014 0528   GFRNONAA 80* 02/16/2014 0528   GFRAA >90 02/16/2014 0528     ASSESSMENT AND PLAN  Normal dual-chamber permanent pacemaker function. The patient is currently asymptomatic from a cardiovascular standpoint. We'll continue with remote follow-up every 3 months and see him in the office in a year. Follow-up with Dr. Claiborne Billings for his coronary problems.  Patient Instructions  Remote monitoring is used to monitor your pacemaker from home. This monitoring reduces the number of office visits required to check your device to one time per year. It allows [place] to keep an eye on the functioning of your device to ensure it is working properly. You are  scheduled for a device check from home on 01-02-2015. You may send your transmission at any time that day. If you have a wireless device, the transmission will be sent automatically. After your physician reviews your transmission, you will receive a postcard with your next transmission date.  Your physician recommends that you schedule a follow-up appointment in: 12 months with Dr.Mareesa Gathright        Orders Placed This Encounter  Procedures  . Implantable device check  . EKG 12-Lead   No orders of the defined types were placed in this encounter.    Holli Humbles, MD, [place] [phone] office [phone] pager

## 2014-10-01 NOTE — Patient Instructions (Signed)
Remote monitoring is used to monitor your pacemaker from home. This monitoring reduces the number of office visits required to check your device to one time per year. It allows [place] to keep an eye on the functioning of your device to ensure it is working properly. You are scheduled for a device check from home on 01-02-2015. You may send your transmission at any time that day. If you have a wireless device, the transmission will be sent automatically. After your physician reviews your transmission, you will receive a postcard with your next transmission date.  Your physician recommends that you schedule a follow-up appointment in: 12 months with Dr.Croitoru

## 2014-10-11 ENCOUNTER — Encounter: Payer: Self-pay | Admitting: Cardiovascular Disease

## 2014-10-17 ENCOUNTER — Encounter (HOSPITAL_COMMUNITY): Payer: Self-pay | Admitting: Cardiovascular Disease

## 2015-01-02 ENCOUNTER — Ambulatory Visit (INDEPENDENT_AMBULATORY_CARE_PROVIDER_SITE_OTHER): Payer: Medicare Other | Admitting: *Deleted

## 2015-01-02 DIAGNOSIS — I441 Atrioventricular block, second degree: Secondary | ICD-10-CM | POA: Diagnosis not present

## 2015-01-02 NOTE — Progress Notes (Signed)
Remote pacemaker transmission.   

## 2015-01-03 LAB — MDC_IDC_ENUM_SESS_TYPE_REMOTE
Battery Remaining Longevity: 91 mo
Battery Voltage: 2.95 V
Brady Statistic AP VP Percent: 6.3 %
Brady Statistic AP VS Percent: 61 %
Brady Statistic AS VP Percent: 1 %
Brady Statistic AS VS Percent: 33 %
Brady Statistic RV Percent Paced: 6.3 %
Implantable Pulse Generator Model: 2210
Lead Channel Impedance Value: 430 Ohm
Lead Channel Impedance Value: 480 Ohm
Lead Channel Pacing Threshold Amplitude: 1 V
Lead Channel Pacing Threshold Amplitude: 1.25 V
Lead Channel Pacing Threshold Pulse Width: 0.4 ms
Lead Channel Sensing Intrinsic Amplitude: 12 mV
Lead Channel Setting Pacing Amplitude: 2 V
Lead Channel Setting Pacing Amplitude: 2.5 V
MDC IDC MSMT BATTERY REMAINING PERCENTAGE: 74 %
MDC IDC MSMT LEADCHNL RA PACING THRESHOLD PULSEWIDTH: 0.4 ms
MDC IDC MSMT LEADCHNL RA SENSING INTR AMPL: 4.7 mV
MDC IDC PG SERIAL: 7336473
MDC IDC SESS DTM: 20160225082512
MDC IDC SET LEADCHNL RV PACING PULSEWIDTH: 0.4 ms
MDC IDC SET LEADCHNL RV SENSING SENSITIVITY: 2 mV
MDC IDC STAT BRADY RA PERCENT PACED: 66 %

## 2015-01-10 ENCOUNTER — Encounter: Payer: Self-pay | Admitting: Cardiology

## 2015-01-15 ENCOUNTER — Encounter: Payer: Self-pay | Admitting: Cardiovascular Disease

## 2015-04-03 ENCOUNTER — Ambulatory Visit (INDEPENDENT_AMBULATORY_CARE_PROVIDER_SITE_OTHER): Payer: Medicare Other | Admitting: *Deleted

## 2015-04-03 DIAGNOSIS — I441 Atrioventricular block, second degree: Secondary | ICD-10-CM | POA: Diagnosis not present

## 2015-04-03 NOTE — Progress Notes (Signed)
Remote pacemaker transmission.   

## 2015-04-11 LAB — CUP PACEART REMOTE DEVICE CHECK
Battery Remaining Longevity: 91 mo
Battery Voltage: 2.95 V
Brady Statistic AP VP Percent: 5.8 %
Brady Statistic AP VS Percent: 61 %
Brady Statistic AS VP Percent: 1 %
Brady Statistic AS VS Percent: 34 %
Brady Statistic RV Percent Paced: 5.8 %
Date Time Interrogation Session: 20160526065144
Lead Channel Impedance Value: 430 Ohm
Lead Channel Impedance Value: 480 Ohm
Lead Channel Pacing Threshold Amplitude: 1 V
Lead Channel Pacing Threshold Pulse Width: 0.4 ms
Lead Channel Sensing Intrinsic Amplitude: 5 mV
Lead Channel Setting Pacing Amplitude: 2 V
Lead Channel Setting Pacing Pulse Width: 0.4 ms
Lead Channel Setting Sensing Sensitivity: 2 mV
MDC IDC MSMT BATTERY REMAINING PERCENTAGE: 74 %
MDC IDC MSMT LEADCHNL RV PACING THRESHOLD AMPLITUDE: 1.25 V
MDC IDC MSMT LEADCHNL RV PACING THRESHOLD PULSEWIDTH: 0.4 ms
MDC IDC MSMT LEADCHNL RV SENSING INTR AMPL: 12 mV
MDC IDC SET LEADCHNL RV PACING AMPLITUDE: 2.5 V
MDC IDC STAT BRADY RA PERCENT PACED: 65 %
Pulse Gen Serial Number: 7336473

## 2015-04-18 ENCOUNTER — Encounter: Payer: Self-pay | Admitting: Cardiology

## 2015-04-22 ENCOUNTER — Encounter: Payer: Self-pay | Admitting: Cardiovascular Disease

## 2015-07-03 ENCOUNTER — Ambulatory Visit (INDEPENDENT_AMBULATORY_CARE_PROVIDER_SITE_OTHER): Payer: Medicare Other | Admitting: *Deleted

## 2015-07-03 DIAGNOSIS — I441 Atrioventricular block, second degree: Secondary | ICD-10-CM | POA: Diagnosis not present

## 2015-07-03 NOTE — Progress Notes (Signed)
Remote pacemaker transmission.   

## 2015-07-15 LAB — CUP PACEART REMOTE DEVICE CHECK
Battery Remaining Percentage: 91 %
Brady Statistic AP VP Percent: 4.7 %
Brady Statistic AP VS Percent: 60 %
Brady Statistic AS VP Percent: 1 %
Brady Statistic AS VS Percent: 35 %
Brady Statistic RV Percent Paced: 4.7 %
Date Time Interrogation Session: 20160825072721
Lead Channel Impedance Value: 430 Ohm
Lead Channel Pacing Threshold Amplitude: 1 V
Lead Channel Pacing Threshold Amplitude: 1.25 V
Lead Channel Pacing Threshold Pulse Width: 0.4 ms
Lead Channel Pacing Threshold Pulse Width: 0.4 ms
Lead Channel Sensing Intrinsic Amplitude: 12 mV
Lead Channel Setting Pacing Amplitude: 2 V
Lead Channel Setting Pacing Amplitude: 2.5 V
Lead Channel Setting Pacing Pulse Width: 0.4 ms
MDC IDC MSMT BATTERY REMAINING LONGEVITY: 110 mo
MDC IDC MSMT BATTERY VOLTAGE: 2.95 V
MDC IDC MSMT LEADCHNL RA IMPEDANCE VALUE: 430 Ohm
MDC IDC MSMT LEADCHNL RA SENSING INTR AMPL: 5 mV
MDC IDC SET LEADCHNL RV SENSING SENSITIVITY: 2 mV
MDC IDC STAT BRADY RA PERCENT PACED: 64 %
Pulse Gen Serial Number: 7336473

## 2015-07-25 ENCOUNTER — Encounter: Payer: Self-pay | Admitting: Cardiology

## 2015-08-01 ENCOUNTER — Encounter: Payer: Self-pay | Admitting: Cardiovascular Disease

## 2015-09-12 ENCOUNTER — Encounter: Payer: Self-pay | Admitting: Cardiovascular Disease

## 2015-09-22 ENCOUNTER — Encounter: Payer: Self-pay | Admitting: Cardiovascular Disease

## 2015-09-22 NOTE — Progress Notes (Signed)
[name], male   DOB: August 05, 1936, 79 y.o.   MRN: [id number]     Cardiology Office Note   Date:  09/23/2015   ID:  Lonnie Marshall, DOB Jul 30, 1936, MRN [id number]  PCP:  Abigail Miyamoto, MD  Cardiologist:  Shelva Majestic, MD;  Sanda Klein, MD   Chief Complaint  Patient presents with  . Annual Exam    No complaints of chest pain, SOB, edema or dizziness.  Recent cold.      History of Present Illness: Lonnie Marshall is a 79 y.o. male who presents for CAD, pacemaker follow up  Lonnie Marshall has history of both sinus node dysfunction and second-degree atrioventricular block Mobitz type I. He received a pacemaker roughly three and a half years ago and it has been functioning normally. He has a history of coronary disease he had ST segment elevation myocardial infarction around the time of pacemaker implantation.  He denies any current cardiovascular complaints.  About 2 months ago he had a respiratory febrile illness and still has a persistent cough. This is slowly getting better. He has been taking an over-the-counter antitussives/decongestant combination that includes phenylephrine. His blood pressure is a little high today, possibly due to this. Last hemoglobin A1c was 6.7%.  Pacemaker interrogation has been performed via remote follow-up. In office check today shows normal device function. Device longevity is estimated to be roughly 8.6-10.2 years. He has roughly 64 % atrial pacing and less than 45% ventricular pacing. He has only 2 episodes of mode switch, both representing brief paroxysmal atrial tachycardia less than 4 seconds in duration. No atrial fibrillation seen.. Lead parameters are good.    Past Medical History  Diagnosis Date  . S/P coronary artery stent placement, emergently to 100% occl. LAD with DES X 2.   02/28/2012  . STEMI (ST elevation myocardial infarction), [facility]  02/27/2012  . Skull fracture ([place]) 1942    "fell out of car; in hospital for some time"    . High cholesterol   . Overactive bladder   . Pacemaker   . Sleep apnea     "suppose to have been tested 03/15/12"  . Arthritis   . Coronary artery disease   . Type II diabetes mellitus ([place])   . Kidney stones     "once"  . Benign localized hyperplasia of prostate with urinary obstruction 07/14/2013  . Ureteral obstruction 07/14/2013  . Hypertension   . Complication of anesthesia     time before last- confused    Past Surgical History  Procedure Laterality Date  . Circumcision  1980's  . Insert / replace / remove pacemaker  03/17/12    "first one"  . Lumbar disc surgery  ~ 1985  . Back surgery    . Cardiac catheterization  02/27/12    "1"  . Coronary angioplasty with stent placement  03/01/12    "+2=3 total"  . Cystoscopy with retrograde pyelogram, ureteroscopy and stent placement Left 07/13/2013    Procedure: CYSTOSCOPY WITH LEFT RETROGRADE PYELOGRAM, LEFT URETEROSCOPY AND left STENT PLACEMENT, cystogram;  Surgeon: Lonnie Bonine, MD;  Location: [facility];  Service: Urology;  Laterality: Left;  . Transurethral resection of prostate N/A 07/13/2013    Procedure: TRANSURETHRAL RESECTION OF THE PROSTATE (TURP);  Surgeon: Lonnie Bonine, MD;  Location: [facility];  Service: Urology;  Laterality: N/A;  . Vasectomy  ~79 years old    same time as circumcision  . Partial colectomy N/A 02/14/2014    Procedure: RIGHT COLECTOMY;  Surgeon: Earnstine Regal, MD;  Location: [facility];  Service: General;  Laterality: N/A;  . Left heart catheterization with coronary angiogram N/A 02/27/2012    Procedure: LEFT HEART CATHETERIZATION WITH CORONARY ANGIOGRAM;  Surgeon: Troy Sine, MD;  Location[facility];  Service: Cardiovascular;  Laterality: N/A;  . Percutaneous coronary stent intervention (pci-s) N/A 03/01/2012    Procedure: PERCUTANEOUS CORONARY STENT INTERVENTION (PCI-S);  Surgeon: Leonie Man, MD;  Location: [facility] CATH LAB;  Service: Cardiovascular;  Laterality: N/A;  . Permanent pacemaker  insertion N/A 03/17/2012    Procedure: PERMANENT PACEMAKER INSERTION;  Surgeon: Sanda Klein, MD;  Location: [facility] CATH LAB;  Service: Cardiovascular;  Laterality: N/A;     Current Outpatient Prescriptions  Medication Sig Dispense Refill  . aspirin EC 81 MG tablet Take 1 tablet (81 mg total) by mouth every morning.    Marland Kitchen atorvastatin (LIPITOR) 80 MG tablet Take 40 mg by mouth daily at 6 PM.     . cholecalciferol (VITAMIN D) 1000 UNITS tablet Take 1,000 Units by mouth daily.    . clopidogrel (PLAVIX) 75 MG tablet Take 1 tablet (75 mg total) by mouth every morning.    . diphenhydrAMINE (BENADRYL) 25 MG tablet Take 25 mg by mouth every 6 (six) hours as needed for itching.    . finasteride (PROSCAR) 5 MG tablet Take 5 mg by mouth daily.    . isosorbide mononitrate (IMDUR) 30 MG 24 hr tablet Take 15 mg by mouth every morning.     Marland Kitchen lisinopril (PRINIVIL,ZESTRIL) 10 MG tablet Take 5 mg by mouth every morning.    . metFORMIN (GLUMETZA) 500 MG (MOD) 24 hr tablet Take 1,000 mg by mouth daily with breakfast.     . metoprolol (LOPRESSOR) 50 MG tablet Take 25 mg by mouth 2 (two) times daily.    . Omega-3 Fatty Acids (FISH OIL) 1200 MG CAPS Take 1,200 mg by mouth 2 (two) times daily.     Marland Kitchen Phenyleph-Doxylamine-DM-APAP (ALKA SELTZER PLUS PO) Take by mouth 2 (two) times daily.     No current facility-administered medications for this visit.    Allergies:   Other    Social History:  The patient  reports that he quit smoking about 40 years ago. His smoking use included Cigarettes. He has a 60 pack-year smoking history. His smokeless tobacco use includes Chew. He reports that he does not drink alcohol or use illicit drugs.    ROS:  Please see the history of present illness.    Otherwise, review of systems positive for none.   All other systems are reviewed and negative.    PHYSICAL EXAM: VS:  BP 164/90 mmHg  Pulse 72  Ht 5\' 10"  (1.778 m)  Wt 172 lb 3.2 oz (78.109 kg)  BMI 24.71 kg/m2 , BMI Body mass  index is 24.71 kg/(m^2).  General: Alert, oriented x3, no distress Head: no evidence of trauma, PERRL, EOMI, no exophtalmos or lid lag, no myxedema, no xanthelasma; normal ears, nose and oropharynx Neck: normal jugular venous pulsations and no hepatojugular reflux; brisk carotid pulses without delay and no carotid bruits Chest: clear to auscultation, no signs of consolidation by percussion or palpation, normal fremitus, symmetrical and full respiratory excursions, Elsie left subclavian pacemaker site Cardiovascular: normal position and quality of the apical impulse, regular rhythm, normal first and second heart sounds, no murmurs, rubs or gallops Abdomen: no tenderness or distention, no masses by palpation, no abnormal pulsatility or arterial bruits, normal bowel sounds, no hepatosplenomegaly  Extremities: no clubbing, cyanosis or edema; 2+ radial, ulnar and brachial pulses bilaterally; 2+ right femoral, posterior tibial and dorsalis pedis pulses; 2+ left femoral, posterior tibial and dorsalis pedis pulses; no subclavian or femoral bruits Neurological: grossly nonfocal Psych: euthymic mood, full affect   EKG:  EKG is ordered today.  Lipid Panel    Component Value Date/Time   CHOL 169 02/28/2012 0857   TRIG 104 02/28/2012 0857   HDL 41 02/28/2012 0857   CHOLHDL 4.1 02/28/2012 0857   VLDL 21 02/28/2012 0857   LDLCALC 107* 02/28/2012 0857      Wt Readings from Last 3 Encounters:  09/23/15 172 lb 3.2 oz (78.109 kg)  10/01/14 172 lb 3.2 oz (78.109 kg)  05/13/14 163 lb (73.936 kg)       ASSESSMENT AND PLAN:  1. Sinus bradycardia and second degree AV block, s/p dual chamber pacemaker Normal device function with very low prevalence of ventricular pacing. CareLink downloads every 3 months, yearly office visit  2. CAD s/p 2013 STEMI due to total proximal LAD occlusion with severe multivessel CAD treated with overlapping stents 2.75x24 2.5x38 DES Xience and Promus. 3 days later he  underwent staged intervention to the obtuse marginal branch of the circumflex vessel. In September 2013 a nuclear study showed distal LAD scar but otherwise significant myocardial salvage and other territories. Post-rest ejection fraction was 64%. No angina since his acute MI in 2013. Physically active. No symptoms of congestive heart failure.  3. Complex sleep apnea with both obstructive and central events on BiPAP  4. HTN Elevated today, possibly due to decongestant use. Recommended he find an alternative over-the-counter combination the does not contain either pseudoephedrine or phenylephrine. Recheck blood pressure and call me if it remains elevated.  5. Hyperlipidemia Plan set blood work performed with his new PCP, Betty Martinique  Current medicines are reviewed at length with the patient today.  The patient does not have concerns regarding medicines.  The following changes have been made:  Stop using over-the-counter medications that contain a decongestant.  Labs/ tests ordered today include:   Orders Placed This Encounter  Procedures  . EKG 12-Lead    Patient Instructions  Remote monitoring is used to monitor your ICD from home. This monitoring reduces the number of office visits required to check your device to one time per year. It allows [place] to monitor the functioning of your device to ensure it is working properly. You are scheduled for a device check from home on December 25, 2015. You may send your transmission at any time that day. If you have a wireless device, the transmission will be sent automatically. After your physician reviews your transmission, you will receive a postcard with your next transmission date.  Dr. Sallyanne Kuster recommends that you schedule a follow-up appointment in: [place] ([place]).         Mikael Spray, MD  09/23/2015 1:52 PM    Sanda Klein, MD, [facility] [phone] office [phone] pager

## 2015-09-23 ENCOUNTER — Encounter: Payer: Self-pay | Admitting: Cardiovascular Disease

## 2015-09-23 ENCOUNTER — Ambulatory Visit (INDEPENDENT_AMBULATORY_CARE_PROVIDER_SITE_OTHER): Payer: Medicare Other | Admitting: Cardiovascular Disease

## 2015-09-23 VITALS — BP 164/90 | HR 72 | Ht 70.0 in | Wt 172.2 lb

## 2015-09-23 DIAGNOSIS — R001 Bradycardia, unspecified: Secondary | ICD-10-CM

## 2015-09-23 DIAGNOSIS — I471 Supraventricular tachycardia: Secondary | ICD-10-CM | POA: Diagnosis not present

## 2015-09-23 DIAGNOSIS — Z955 Presence of coronary angioplasty implant and graft: Secondary | ICD-10-CM

## 2015-09-23 DIAGNOSIS — I441 Atrioventricular block, second degree: Secondary | ICD-10-CM

## 2015-09-23 DIAGNOSIS — Z950 Presence of cardiac pacemaker: Secondary | ICD-10-CM

## 2015-09-23 NOTE — Patient Instructions (Signed)
Remote monitoring is used to monitor your ICD from home. This monitoring reduces the number of office visits required to check your device to one time per year. It allows [place] to monitor the functioning of your device to ensure it is working properly. You are scheduled for a device check from home on December 25, 2015. You may send your transmission at any time that day. If you have a wireless device, the transmission will be sent automatically. After your physician reviews your transmission, you will receive a postcard with your next transmission date.  Dr. Sallyanne Kuster recommends that you schedule a follow-up appointment in: [place] ([place]).

## 2015-09-30 LAB — CUP PACEART INCLINIC DEVICE CHECK
Battery Remaining Percentage: 91 %
Battery Voltage: 2.95 V
Brady Statistic RA Percent Paced: 64 %
Brady Statistic RV Percent Paced: 4.2 %
Implantable Lead Implant Date: 20130510
Implantable Lead Location: 753860
Lead Channel Impedance Value: 480 Ohm
Lead Channel Impedance Value: 480 Ohm
Lead Channel Pacing Threshold Pulse Width: 0.4 ms
Lead Channel Pacing Threshold Pulse Width: 0.4 ms
Lead Channel Sensing Intrinsic Amplitude: 12 mV
Lead Channel Sensing Intrinsic Amplitude: 4.9 mV
Lead Channel Setting Pacing Amplitude: 2 V
Lead Channel Setting Pacing Pulse Width: 0.4 ms
MDC IDC LEAD IMPLANT DT: 20130510
MDC IDC LEAD LOCATION: 753859
MDC IDC MSMT BATTERY REMAINING LONGEVITY: 102 mo
MDC IDC MSMT LEADCHNL RA PACING THRESHOLD AMPLITUDE: 1 V
MDC IDC MSMT LEADCHNL RV PACING THRESHOLD AMPLITUDE: 1.25 V
MDC IDC SESS DTM: 20161122142236
MDC IDC SET LEADCHNL RV PACING AMPLITUDE: 2.5 V
MDC IDC SET LEADCHNL RV SENSING SENSITIVITY: 2 mV
Pulse Gen Model: 2210
Pulse Gen Serial Number: 7336473

## 2015-10-10 ENCOUNTER — Encounter: Payer: Self-pay | Admitting: Cardiovascular Disease

## 2016-10-04 ENCOUNTER — Encounter: Payer: Self-pay | Admitting: *Deleted

## 2016-10-05 ENCOUNTER — Ambulatory Visit (INDEPENDENT_AMBULATORY_CARE_PROVIDER_SITE_OTHER): Payer: Medicare Other | Admitting: Cardiovascular Disease

## 2016-10-05 ENCOUNTER — Encounter: Payer: Self-pay | Admitting: Cardiovascular Disease

## 2016-10-05 VITALS — BP 160/80 | HR 78 | Ht 70.0 in | Wt 174.1 lb

## 2016-10-05 DIAGNOSIS — I441 Atrioventricular block, second degree: Secondary | ICD-10-CM

## 2016-10-05 DIAGNOSIS — I2510 Atherosclerotic heart disease of native coronary artery without angina pectoris: Secondary | ICD-10-CM | POA: Diagnosis not present

## 2016-10-05 DIAGNOSIS — I10 Essential (primary) hypertension: Secondary | ICD-10-CM

## 2016-10-05 DIAGNOSIS — R001 Bradycardia, unspecified: Secondary | ICD-10-CM

## 2016-10-05 DIAGNOSIS — Z950 Presence of cardiac pacemaker: Secondary | ICD-10-CM | POA: Diagnosis not present

## 2016-10-05 DIAGNOSIS — I495 Sick sinus syndrome: Secondary | ICD-10-CM | POA: Diagnosis not present

## 2016-10-05 DIAGNOSIS — G4733 Obstructive sleep apnea (adult) (pediatric): Secondary | ICD-10-CM

## 2016-10-05 DIAGNOSIS — E785 Hyperlipidemia, unspecified: Secondary | ICD-10-CM

## 2016-10-05 DIAGNOSIS — E1159 Type 2 diabetes mellitus with other circulatory complications: Secondary | ICD-10-CM

## 2016-10-05 NOTE — Patient Instructions (Addendum)
Dr Sallyanne Kuster has recommended making the following medication changes: 1. STOP Imdur  Your physician has requested that you regularly monitor and record your blood pressure readings at home. Please use the same machine at the same time of day to check your readings and record them to bring to your follow-up visit. IN ONE WEEK please send Lonnie Marshall your blood pressure readings either via mychart or by calling the office to speak with a nurse.  Remote monitoring is used to monitor your Pacemaker or ICD from home. This monitoring reduces the number of office visits required to check your device to one time per year. It allows Lonnie Marshall to keep an eye on the functioning of your device to ensure it is working properly. You are scheduled for a device check from home on Tuesday, February 27th, 2018. You may send your transmission at any time that day. If you have a wireless device, the transmission will be sent automatically. After your physician reviews your transmission, you will receive a postcard with your next transmission date.  Dr Sallyanne Kuster recommends that you schedule a follow-up appointment in 12 months with a pacemaker check. You will receive a reminder letter in the mail two months in advance. If you don't receive a letter, please call our office to schedule the follow-up appointment.  If you need a refill on your cardiac medications before your next appointment, please call your pharmacy.

## 2016-10-05 NOTE — Progress Notes (Signed)
Cardiology Office Note    Date:  10/05/2016   ID:  Lonnie Marshall, DOB 1936/11/01, MRN [id number]  PCP:  Beatris Si  Cardiologist:  Shelva Majestic, M.D.; Sanda Klein, MD   Chief Complaint  Patient presents with  . Follow-up    no chest pain, occassional shortness of breath with exertion, no pain or cramping in legs, no lightheaded or dizziness, occassional edema in feet     History of Present Illness:  Lonnie Marshall is a 80 y.o. male with history of coronary artery disease (2013 anterior STEMI, overlapping stents 2.75x24 2.5x38 DES Xience and Promus; staged intervention to OM same year), combined obstructive and central sleep apnea on bilevel CPAP, sinus node dysfunction and second-degree AV block type I with dual chamber permanent pacemaker (St. Jude Accent 2013), type 2 diabetes mellitus, treated hypothyroidism, hyperlipidemia on statin, returning for yearly pacemaker visit.   He has been compliant with remote pacemaker downloads. He has not seen Dr. Claiborne Billings in a while, but now receives his routine cardiology care at the [facility]. He reports labs performed there within the last 2-3 months that included evaluation of his lipids and diabetic control.  He denies any problems with angina or dyspnea although he is reasonably active for a gentleman his age. He has occasional orthostatic dizziness especially early in the morning. His blood pressure is high today there is also substantial drop with change in position(sitting 180/80, standing 145/70). He states that the current blood pressure recorded sitting is unusually high for him.  He denies syncope or palpitations, edema, claudication, stroke symptoms, headaches, other cardiovascular complaints.  Pacemaker interrogation shows normal device function. Estimated device longevity is 7. 5-9 years. All lead parameters are excellent. He has 64% atrial pacing and 7% ventricular pacing. He has had very rare episodes of paroxysmal atrial  tachycardia lasting up to 8 seconds in duration and he has not had atrial fibrillation.     Past Medical History:  Diagnosis Date  . Arthritis   . Benign localized hyperplasia of prostate with urinary obstruction 07/14/2013  . Complication of anesthesia    time before last- confused  . Coronary artery disease   . High cholesterol   . Hypertension   . Kidney stones    "once"  . Overactive bladder   . Pacemaker   . S/P coronary artery stent placement, emergently to 100% occl. LAD with DES X 2.   02/28/2012  . Skull fracture ([place]) 1942   "fell out of car; in hospital for some time"  . Sleep apnea    "suppose to have been tested 03/15/12"  . STEMI (ST elevation myocardial infarction), [place]  02/27/2012  . Type II diabetes mellitus ([place])   . Ureteral obstruction 07/14/2013    Past Surgical History:  Procedure Laterality Date  . BACK SURGERY    . CARDIAC CATHETERIZATION  02/27/12   "1"  . CIRCUMCISION  1980's  . CORONARY ANGIOPLASTY WITH STENT PLACEMENT  03/01/12   "+2=3 total"  . CYSTOSCOPY WITH RETROGRADE PYELOGRAM, URETEROSCOPY AND STENT PLACEMENT Left 07/13/2013   Procedure: CYSTOSCOPY WITH LEFT RETROGRADE PYELOGRAM, LEFT URETEROSCOPY AND left STENT PLACEMENT, cystogram;  Surgeon: Fredricka Bonine, MD;  Location: [facility];  Service: Urology;  Laterality: Left;  . INSERT / REPLACE / REMOVE PACEMAKER  03/17/12   "first one"  . LEFT HEART CATHETERIZATION WITH CORONARY ANGIOGRAM N/A 02/27/2012   Procedure: LEFT HEART CATHETERIZATION WITH CORONARY ANGIOGRAM;  Surgeon: Troy Sine, MD;  Location: [facility] CATH LAB;  Service: Cardiovascular;  Laterality: N/A;  . LUMBAR DISC SURGERY  ~ 1985  . PARTIAL COLECTOMY N/A 02/14/2014   Procedure: RIGHT COLECTOMY;  Surgeon: Earnstine Regal, MD;  Location: [facility];  Service: General;  Laterality: N/A;  . PERCUTANEOUS CORONARY STENT INTERVENTION (PCI-S) N/A 03/01/2012   Procedure: PERCUTANEOUS CORONARY STENT INTERVENTION (PCI-S);  Surgeon: Leonie Man, MD;  Location: [facility] CATH LAB;  Service: Cardiovascular;  Laterality: N/A;  . PERMANENT PACEMAKER INSERTION N/A 03/17/2012   Procedure: PERMANENT PACEMAKER INSERTION;  Surgeon: Sanda Klein, MD;  Location: [facility] CATH LAB;  Service: Cardiovascular;  Laterality: N/A;  . TRANSURETHRAL RESECTION OF PROSTATE N/A 07/13/2013   Procedure: TRANSURETHRAL RESECTION OF THE PROSTATE (TURP);  Surgeon: Fredricka Bonine, MD;  Location: [facility];  Service: Urology;  Laterality: N/A;  . VASECTOMY  ~80 years old   same time as circumcision    Current Medications: Outpatient Medications Prior to Visit  Medication Sig Dispense Refill  . aspirin EC 81 MG tablet Take 1 tablet (81 mg total) by mouth every morning.    Marland Kitchen atorvastatin (LIPITOR) 80 MG tablet Take 40 mg by mouth daily at 6 PM.     . cholecalciferol (VITAMIN D) 1000 UNITS tablet Take 1,000 Units by mouth daily.    . clopidogrel (PLAVIX) 75 MG tablet Take 1 tablet (75 mg total) by mouth every morning.    . diphenhydrAMINE (BENADRYL) 25 MG tablet Take 25 mg by mouth every 6 (six) hours as needed for itching.    . finasteride (PROSCAR) 5 MG tablet Take 5 mg by mouth daily.    Marland Kitchen lisinopril (PRINIVIL,ZESTRIL) 10 MG tablet Take 5 mg by mouth every morning.    . metFORMIN (GLUMETZA) 500 MG (MOD) 24 hr tablet Take 1,000 mg by mouth daily with breakfast.     . metoprolol (LOPRESSOR) 50 MG tablet Take 25 mg by mouth 2 (two) times daily.    . Omega-3 Fatty Acids (FISH OIL) 1200 MG CAPS Take 1,200 mg by mouth 2 (two) times daily.     Marland Kitchen Phenyleph-Doxylamine-DM-APAP (ALKA SELTZER PLUS PO) Take by mouth 2 (two) times daily.    . isosorbide mononitrate (IMDUR) 30 MG 24 hr tablet Take 15 mg by mouth every morning.      No facility-administered medications prior to visit.      Allergies:   Other   Social History   Social History  . Marital status: Married    Spouse name: N/A  . Number of children: N/A  . Years of education: N/A   Social History Main  Topics  . Smoking status: Former Smoker    Packs/day: 3.00    Years: 20.00    Types: Cigarettes    Quit date: 03/03/1975  . Smokeless tobacco: Current User    Types: Chew  . Alcohol use No  . Drug use: No  . Sexual activity: Not Currently   Other Topics Concern  . None   Social History Narrative  . None       ROS:   Please see the history of present illness.    ROS All other systems reviewed and are negative.   PHYSICAL EXAM:   VS:  BP (!) 160/80 (BP Location: Right Arm, Patient Position: Sitting, Cuff Size: Normal)   Pulse 78   Ht 5\' 10"  (1.778 m)   Wt 174 lb 2 oz (79 kg)   BMI 24.98 kg/m    GEN: Well nourished, well developed, in no acute distress  HEENT:  normal  Neck: no JVD, carotid bruits, or masses Cardiac: RRR; no murmurs, rubs, or gallops,no edema , LC left subclavian pacemaker site  Respiratory:  clear to auscultation bilaterally, normal work of breathing GI: soft, nontender, nondistended, + BS MS: no deformity or atrophy  Skin: warm and dry, no rash Neuro:  Alert and Oriented x 3, Strength and sensation are intact Psych: euthymic mood, full affect  Wt Readings from Last 3 Encounters:  10/05/16 174 lb 2 oz (79 kg)  09/23/15 172 lb 3.2 oz (78.1 kg)  10/01/14 172 lb 3.2 oz (78.1 kg)      Studies/Labs Reviewed:   EKG:  EKG is ordered today.  The ekg ordered today demonstrates Atrial paced, ventricular sensed rhythm, otherwise normal.    Lipid Panel    Component Value Date/Time   CHOL 169 02/28/2012 0857   TRIG 104 02/28/2012 0857   HDL 41 02/28/2012 0857   CHOLHDL 4.1 02/28/2012 0857   VLDL 21 02/28/2012 0857   LDLCALC 107 (H) 02/28/2012 0857   ASSESSMENT:    1. SSS (sick sinus syndrome) ([place])   2. Second degree Mobitz type II incomplete atrioventricular block   3. Pacemaker   4. Coronary artery disease involving native coronary artery of native heart without angina pectoris   5. Essential hypertension   6. Dyslipidemia   7. Type 2  diabetes mellitus with other circulatory complication, without long-term current use of insulin ([place])   8. OSA (obstructive sleep apnea)      PLAN:  In order of problems listed above:  1. SSS: No clinical symptoms of chronotropic incompetence and satisfactory heart rate histograms with current device settings. 2. 2nd deg AVB: Low prevalence of ventricular pacing 3. PPM: Normal device function. No programming changes made. Continue remote download every 3 months. 4. CAD: Asymptomatic, has not required nitroglycerin and years. We'll discontinue his isosorbide since this may be contributing to orthostatic dizziness. He is to call us back if he develops angina pectoris. Has known scar in the distal LAD artery distribution but did not have ischemia on his most recent nuclear stress test September 2013. 5. HTN: Uncharacteristically high blood pressure today was also seen on November 15. Reluctant to increase medications due to his problems with orthostatic hypotension. Have asked him to check blood pressure at home on a daily basis and send me a week worth of records before we decide on additional medication changes. He is on relatively low doses of lisinopril and metoprolol. 6. HLP: Have asked him to get a copy of his labs from the [facility] for our records 7. DM: He reports good control, I don't have a record of his most recent hemoglobin A1c. 8. OSA: Reports compliance with CPAP. He has not had a follow-up visit with Dr. Claiborne Billings in a long time.    Medication Adjustments/Labs and Tests Ordered: Current medicines are reviewed at length with the patient today.  Concerns regarding medicines are outlined above.  Medication changes, Labs and Tests ordered today are listed in the Patient Instructions below. Patient Instructions  Dr Sallyanne Kuster has recommended making the following medication changes: 1. STOP Imdur  Your physician has requested that you regularly monitor and record your blood pressure  readings at home. Please use the same machine at the same time of day to check your readings and record them to bring to your follow-up visit. IN ONE WEEK please send [place] your blood pressure readings either via mychart or by calling the office to speak with  a nurse.  Remote monitoring is used to monitor your Pacemaker or ICD from home. This monitoring reduces the number of office visits required to check your device to one time per year. It allows [place] to keep an eye on the functioning of your device to ensure it is working properly. You are scheduled for a device check from home on Tuesday, February 27th, 2018. You may send your transmission at any time that day. If you have a wireless device, the transmission will be sent automatically. After your physician reviews your transmission, you will receive a postcard with your next transmission date.  Dr Sallyanne Kuster recommends that you schedule a follow-up appointment in 12 months with a pacemaker check. You will receive a reminder letter in the mail two months in advance. If you don't receive a letter, please call our office to schedule the follow-up appointment.  If you need a refill on your cardiac medications before your next appointment, please call your pharmacy.    Signed, Sanda Klein, MD  10/05/2016 7:27 PM    [address], [place], [place]  [address] Phone: [phone]; Fax: [phone]

## 2016-10-15 LAB — CUP PACEART INCLINIC DEVICE CHECK
Battery Voltage: 2.93 V
Date Time Interrogation Session: 20171208110546
Implantable Lead Implant Date: 20130510
Implantable Lead Location: 753859
Implantable Lead Location: 753860
Implantable Pulse Generator Implant Date: 20130510
Lead Channel Impedance Value: 410 Ohm
Lead Channel Impedance Value: 430 Ohm
Lead Channel Pacing Threshold Amplitude: 1.25 V
Lead Channel Pacing Threshold Pulse Width: 0.4 ms
Lead Channel Pacing Threshold Pulse Width: 0.4 ms
Lead Channel Sensing Intrinsic Amplitude: 12 mV
Lead Channel Setting Pacing Amplitude: 2 V
Lead Channel Setting Pacing Pulse Width: 0.4 ms
Lead Channel Setting Sensing Sensitivity: 2 mV
MDC IDC LEAD IMPLANT DT: 20130510
MDC IDC MSMT BATTERY REMAINING LONGEVITY: 88 mo
MDC IDC MSMT BATTERY REMAINING PERCENTAGE: 81 %
MDC IDC MSMT LEADCHNL RA PACING THRESHOLD AMPLITUDE: 1 V
MDC IDC MSMT LEADCHNL RA SENSING INTR AMPL: 4.7 mV
MDC IDC SET LEADCHNL RV PACING AMPLITUDE: 2.5 V
MDC IDC STAT BRADY RA PERCENT PACED: 64 %
MDC IDC STAT BRADY RV PERCENT PACED: 6.9 %
Pulse Gen Model: 2210
Pulse Gen Serial Number: 7336473

## 2016-11-18 ENCOUNTER — Telehealth: Payer: Self-pay

## 2016-11-18 NOTE — Telephone Encounter (Signed)
Received 2 weeks of BP readings per last office visit.   BPs ranges from 127-193/59-83 Highest BP - 193/78 Lowest BP - 127/59 Record sent to be scanned.  Per MCr - BP still high. Please increase Lisinopril to 10 mg daily and after 1-2 weeks should see full effect on BP. Send log again to me or TK.  Called patient to notify him of changes. Per patient he has already been taking Lisinopril 10 mg daily.   Discussed with MCr - increase Lisinopril to 20 mg daily.

## 2017-01-04 ENCOUNTER — Ambulatory Visit (INDEPENDENT_AMBULATORY_CARE_PROVIDER_SITE_OTHER): Payer: Medicare Other | Admitting: *Deleted

## 2017-01-04 DIAGNOSIS — I495 Sick sinus syndrome: Secondary | ICD-10-CM | POA: Diagnosis not present

## 2017-01-04 NOTE — Progress Notes (Signed)
Remote pacemaker transmission.   

## 2017-01-05 ENCOUNTER — Encounter: Payer: Self-pay | Admitting: Cardiology

## 2017-01-07 LAB — CUP PACEART REMOTE DEVICE CHECK
Battery Remaining Longevity: 109 mo
Battery Remaining Percentage: 91 %
Battery Voltage: 2.95 V
Brady Statistic RA Percent Paced: 65 %
Brady Statistic RV Percent Paced: 4.4 %
Date Time Interrogation Session: 20180227070014
Implantable Lead Implant Date: 20130510
Implantable Lead Location: 753860
Implantable Pulse Generator Implant Date: 20130510
Lead Channel Impedance Value: 390 Ohm
Lead Channel Pacing Threshold Amplitude: 1 V
Lead Channel Pacing Threshold Amplitude: 1.25 V
Lead Channel Pacing Threshold Pulse Width: 0.4 ms
Lead Channel Sensing Intrinsic Amplitude: 5 mV
Lead Channel Setting Pacing Amplitude: 2 V
Lead Channel Setting Pacing Amplitude: 2.5 V
Lead Channel Setting Pacing Pulse Width: 0.4 ms
MDC IDC LEAD IMPLANT DT: 20130510
MDC IDC LEAD LOCATION: 753859
MDC IDC MSMT LEADCHNL RV IMPEDANCE VALUE: 430 Ohm
MDC IDC MSMT LEADCHNL RV PACING THRESHOLD PULSEWIDTH: 0.4 ms
MDC IDC MSMT LEADCHNL RV SENSING INTR AMPL: 12 mV
MDC IDC SET LEADCHNL RV SENSING SENSITIVITY: 2 mV
MDC IDC STAT BRADY AP VP PERCENT: 4.3 %
MDC IDC STAT BRADY AP VS PERCENT: 62 %
MDC IDC STAT BRADY AS VP PERCENT: 1 %
MDC IDC STAT BRADY AS VS PERCENT: 34 %
Pulse Gen Model: 2210
Pulse Gen Serial Number: 7336473

## 2017-04-06 ENCOUNTER — Ambulatory Visit (INDEPENDENT_AMBULATORY_CARE_PROVIDER_SITE_OTHER): Payer: Medicare Other | Admitting: *Deleted

## 2017-04-06 DIAGNOSIS — R001 Bradycardia, unspecified: Secondary | ICD-10-CM

## 2017-04-07 NOTE — Progress Notes (Signed)
Remote pacemaker transmission.   

## 2017-04-15 ENCOUNTER — Encounter: Payer: Self-pay | Admitting: Cardiology

## 2017-04-19 LAB — CUP PACEART REMOTE DEVICE CHECK
Battery Remaining Longevity: 100 mo
Battery Remaining Percentage: 81 %
Brady Statistic AP VP Percent: 6.2 %
Brady Statistic AP VS Percent: 54 %
Brady Statistic AS VS Percent: 40 %
Brady Statistic RA Percent Paced: 59 %
Brady Statistic RV Percent Paced: 6.2 %
Implantable Lead Implant Date: 20130510
Implantable Lead Implant Date: 20130510
Implantable Lead Location: 753859
Implantable Pulse Generator Implant Date: 20130510
Lead Channel Impedance Value: 490 Ohm
Lead Channel Pacing Threshold Amplitude: 1.25 V
Lead Channel Pacing Threshold Pulse Width: 0.4 ms
Lead Channel Pacing Threshold Pulse Width: 0.4 ms
Lead Channel Sensing Intrinsic Amplitude: 5 mV
Lead Channel Setting Pacing Amplitude: 2 V
Lead Channel Setting Pacing Amplitude: 2.5 V
Lead Channel Setting Pacing Pulse Width: 0.4 ms
Lead Channel Setting Sensing Sensitivity: 2 mV
MDC IDC LEAD LOCATION: 753860
MDC IDC MSMT BATTERY VOLTAGE: 2.93 V
MDC IDC MSMT LEADCHNL RA IMPEDANCE VALUE: 430 Ohm
MDC IDC MSMT LEADCHNL RA PACING THRESHOLD AMPLITUDE: 1 V
MDC IDC MSMT LEADCHNL RV SENSING INTR AMPL: 12 mV
MDC IDC PG SERIAL: 7336473
MDC IDC SESS DTM: 20180530070427
MDC IDC STAT BRADY AS VP PERCENT: 1 %

## 2017-07-06 ENCOUNTER — Ambulatory Visit (INDEPENDENT_AMBULATORY_CARE_PROVIDER_SITE_OTHER): Payer: Medicare Other | Admitting: *Deleted

## 2017-07-06 DIAGNOSIS — I495 Sick sinus syndrome: Secondary | ICD-10-CM

## 2017-07-07 NOTE — Progress Notes (Signed)
Remote pacemaker transmission.   

## 2017-07-19 ENCOUNTER — Encounter: Payer: Self-pay | Admitting: Cardiology

## 2017-07-27 LAB — CUP PACEART REMOTE DEVICE CHECK
Battery Remaining Percentage: 81 %
Brady Statistic AP VP Percent: 6.9 %
Brady Statistic AP VS Percent: 54 %
Brady Statistic AS VP Percent: 1 %
Brady Statistic AS VS Percent: 39 %
Brady Statistic RA Percent Paced: 61 %
Brady Statistic RV Percent Paced: 6.9 %
Implantable Lead Implant Date: 20130510
Implantable Lead Location: 753860
Lead Channel Impedance Value: 430 Ohm
Lead Channel Impedance Value: 440 Ohm
Lead Channel Pacing Threshold Amplitude: 1 V
Lead Channel Pacing Threshold Amplitude: 1.25 V
Lead Channel Pacing Threshold Pulse Width: 0.4 ms
Lead Channel Pacing Threshold Pulse Width: 0.4 ms
Lead Channel Sensing Intrinsic Amplitude: 12 mV
Lead Channel Setting Pacing Amplitude: 2.5 V
MDC IDC LEAD IMPLANT DT: 20130510
MDC IDC LEAD LOCATION: 753859
MDC IDC MSMT BATTERY REMAINING LONGEVITY: 99 mo
MDC IDC MSMT BATTERY VOLTAGE: 2.93 V
MDC IDC MSMT LEADCHNL RA SENSING INTR AMPL: 5 mV
MDC IDC PG IMPLANT DT: 20130510
MDC IDC SESS DTM: 20180829073254
MDC IDC SET LEADCHNL RA PACING AMPLITUDE: 2 V
MDC IDC SET LEADCHNL RV PACING PULSEWIDTH: 0.4 ms
MDC IDC SET LEADCHNL RV SENSING SENSITIVITY: 2 mV
Pulse Gen Serial Number: 7336473

## 2017-10-05 ENCOUNTER — Ambulatory Visit (INDEPENDENT_AMBULATORY_CARE_PROVIDER_SITE_OTHER): Payer: Medicare Other | Admitting: *Deleted

## 2017-10-05 DIAGNOSIS — I495 Sick sinus syndrome: Secondary | ICD-10-CM

## 2017-10-05 NOTE — Progress Notes (Signed)
Remote pacemaker transmission.   

## 2017-10-07 ENCOUNTER — Encounter: Payer: Self-pay | Admitting: Cardiology

## 2017-10-18 LAB — CUP PACEART REMOTE DEVICE CHECK
Battery Remaining Longevity: 100 mo
Battery Remaining Percentage: 81 %
Brady Statistic AS VP Percent: 1 %
Brady Statistic RA Percent Paced: 61 %
Brady Statistic RV Percent Paced: 7 %
Date Time Interrogation Session: 20181128080646
Implantable Lead Implant Date: 20130510
Implantable Lead Location: 753860
Lead Channel Impedance Value: 430 Ohm
Lead Channel Impedance Value: 490 Ohm
Lead Channel Pacing Threshold Pulse Width: 0.4 ms
Lead Channel Sensing Intrinsic Amplitude: 12 mV
Lead Channel Setting Pacing Amplitude: 2 V
Lead Channel Setting Sensing Sensitivity: 2 mV
MDC IDC LEAD IMPLANT DT: 20130510
MDC IDC LEAD LOCATION: 753859
MDC IDC MSMT BATTERY VOLTAGE: 2.93 V
MDC IDC MSMT LEADCHNL RA PACING THRESHOLD AMPLITUDE: 1 V
MDC IDC MSMT LEADCHNL RA SENSING INTR AMPL: 5 mV
MDC IDC MSMT LEADCHNL RV PACING THRESHOLD AMPLITUDE: 1.25 V
MDC IDC MSMT LEADCHNL RV PACING THRESHOLD PULSEWIDTH: 0.4 ms
MDC IDC PG IMPLANT DT: 20130510
MDC IDC SET LEADCHNL RV PACING AMPLITUDE: 2.5 V
MDC IDC SET LEADCHNL RV PACING PULSEWIDTH: 0.4 ms
MDC IDC STAT BRADY AP VP PERCENT: 7 %
MDC IDC STAT BRADY AP VS PERCENT: 55 %
MDC IDC STAT BRADY AS VS PERCENT: 38 %
Pulse Gen Model: 2210
Pulse Gen Serial Number: 7336473

## 2017-11-16 ENCOUNTER — Encounter: Payer: Self-pay | Admitting: Cardiovascular Disease

## 2017-11-16 ENCOUNTER — Ambulatory Visit: Payer: Medicare Other | Admitting: Cardiovascular Disease

## 2017-11-16 VITALS — BP 144/71 | HR 60 | Ht 70.0 in | Wt 169.4 lb

## 2017-11-16 DIAGNOSIS — E039 Hypothyroidism, unspecified: Secondary | ICD-10-CM

## 2017-11-16 DIAGNOSIS — E7800 Pure hypercholesterolemia, unspecified: Secondary | ICD-10-CM

## 2017-11-16 DIAGNOSIS — I10 Essential (primary) hypertension: Secondary | ICD-10-CM

## 2017-11-16 DIAGNOSIS — I2510 Atherosclerotic heart disease of native coronary artery without angina pectoris: Secondary | ICD-10-CM

## 2017-11-16 DIAGNOSIS — I441 Atrioventricular block, second degree: Secondary | ICD-10-CM

## 2017-11-16 DIAGNOSIS — R55 Syncope and collapse: Secondary | ICD-10-CM | POA: Diagnosis not present

## 2017-11-16 DIAGNOSIS — I495 Sick sinus syndrome: Secondary | ICD-10-CM

## 2017-11-16 DIAGNOSIS — Z950 Presence of cardiac pacemaker: Secondary | ICD-10-CM

## 2017-11-16 DIAGNOSIS — G4731 Primary central sleep apnea: Secondary | ICD-10-CM

## 2017-11-16 NOTE — Patient Instructions (Signed)

## 2017-11-16 NOTE — Progress Notes (Signed)
[name], male   DOB: Feb 07, 1936, 82 y.o.   MRN: [id number]     Cardiology Office Note   Date:  11/17/2017   ID:  Lonnie Marshall, DOB [birth year], MRN [id number]  PCP:  Starlyn Skeans, PA-C  Cardiologist:  Shelva Majestic, MD;  Sanda Klein, MD   Chief Complaint  Patient presents with  . Follow-up  Pacemaker check    History of Present Illness: Lonnie Marshall is a 82 y.o. male who presents for CAD, pacemaker follow up  Mr. Offord has history of both sinus node dysfunction and second-degree atrioventricular block Mobitz type I. He received a pacemaker in 2013. He has a history of coronary disease: ST segment elevation myocardial infarction around the time of pacemaker implantation. He had total proximal LAD occlusion treated with overlapping stents 2.75x24 2.5x38 DES Xience and Promus. 3 days later he underwent staged intervention to the obtuse marginal branch of the circumflex vessel. In September 2013 a nuclear study showed distal LAD scar but otherwise significant myocardial salvage and other territories. Post-rest ejection fraction was 64%. No angina since his acute MI in 2013.  The patient specifically denies any chest pain at rest exertion, dyspnea at rest or with exertion, orthopnea, paroxysmal nocturnal dyspnea, syncope, palpitations, focal neurological deficits, intermittent claudication, lower extremity edema, unexplained weight gain, cough, hemoptysis or wheezing.  He reports that lab test performed at the [facility] of the last couple of months were "okay".  He has a copy of the labs at home but did not bring it today.  He believes his cholesterol was checked, but is not sure that his thyroid function was checked.  He is always cold nature according to his wife.  He turned the heater on in the bathroom and became overheated after which she had a brief syncopal events that resolved after lying in bed for a few minutes.  It was preceded by sensation of flushing  sweating and television and sounds highly suggestive of vasovagal syncope.  He is on a remarkably low dose of levothyroxine supplement of only 37.5 mcg.  In addition to being cold nature he complains of feeling sleepy often.  Pacemaker function is normal.  Battery longevity is estimated at 7.5-9 years.  Lead parameters are excellent.  He has 62% atrial pacing and only 7.5% ventricular pacing.  A single brief episode of paroxysmal tachycardia has been recorded in the last year.  There is no evidence of atrial fibrillation.   Past Medical History:  Diagnosis Date  . Arthritis   . Benign localized hyperplasia of prostate with urinary obstruction 07/14/2013  . Complication of anesthesia    time before last- confused  . Coronary artery disease   . High cholesterol   . Hypertension   . Kidney stones    "once"  . Overactive bladder   . Pacemaker   . S/P coronary artery stent placement, emergently to 100% occl. LAD with DES X 2.   02/28/2012  . Skull fracture ([place]) 1942   "fell out of car; in hospital for some time"  . Sleep apnea    "suppose to have been tested 03/15/12"  . STEMI (ST elevation myocardial infarction), [place]  02/27/2012  . Type II diabetes mellitus ([place])   . Ureteral obstruction 07/14/2013    Past Surgical History:  Procedure Laterality Date  . BACK SURGERY    . CARDIAC CATHETERIZATION  02/27/12   "1"  . CIRCUMCISION  1980's  . CORONARY ANGIOPLASTY WITH STENT PLACEMENT  03/01/12   "+  2=3 total"  . CYSTOSCOPY WITH RETROGRADE PYELOGRAM, URETEROSCOPY AND STENT PLACEMENT Left 07/13/2013   Procedure: CYSTOSCOPY WITH LEFT RETROGRADE PYELOGRAM, LEFT URETEROSCOPY AND left STENT PLACEMENT, cystogram;  Surgeon: Fredricka Bonine, MD;  [facility];  Service: Urology;  Laterality: Left;  . INSERT / REPLACE / REMOVE PACEMAKER  03/17/12   "first one"  . LEFT HEART CATHETERIZATION WITH CORONARY ANGIOGRAM N/A 02/27/2012   Procedure: LEFT HEART CATHETERIZATION WITH CORONARY  ANGIOGRAM;  Surgeon: Troy Sine, MD;  Location: [facility] CATH LAB;  Service: Cardiovascular;  Laterality: N/A;  . LUMBAR DISC SURGERY  ~ 1985  . PARTIAL COLECTOMY N/A 02/14/2014   Procedure: RIGHT COLECTOMY;  Surgeon: Earnstine Regal, MD;  Location: [facility];  Service: General;  Laterality: N/A;  . PERCUTANEOUS CORONARY STENT INTERVENTION (PCI-S) N/A 03/01/2012   Procedure: PERCUTANEOUS CORONARY STENT INTERVENTION (PCI-S);  Surgeon: Leonie Man, MD;  Location: [facility] CATH LAB;  Service: Cardiovascular;  Laterality: N/A;  . PERMANENT PACEMAKER INSERTION N/A 03/17/2012   Procedure: PERMANENT PACEMAKER INSERTION;  Surgeon: Sanda Klein, MD;  Location: [facility] CATH LAB;  Service: Cardiovascular;  Laterality: N/A;  . TRANSURETHRAL RESECTION OF PROSTATE N/A 07/13/2013   Procedure: TRANSURETHRAL RESECTION OF THE PROSTATE (TURP);  Surgeon: Fredricka Bonine, MD;  Location: [facility];  Service: Urology;  Laterality: N/A;  . VASECTOMY  ~82 years old   same time as circumcision     Current Outpatient Medications  Medication Sig Dispense Refill  . aspirin EC 81 MG tablet Take 1 tablet (81 mg total) by mouth every morning.    Marland Kitchen atorvastatin (LIPITOR) 80 MG tablet Take 40 mg by mouth daily at 6 PM.     . cholecalciferol (VITAMIN D) 1000 UNITS tablet Take 1,000 Units by mouth daily.    . clopidogrel (PLAVIX) 75 MG tablet Take 1 tablet (75 mg total) by mouth every morning.    . diphenhydrAMINE (BENADRYL) 25 MG tablet Take 25 mg by mouth every 6 (six) hours as needed for itching.    . finasteride (PROSCAR) 5 MG tablet Take 5 mg by mouth daily.    Marland Kitchen levothyroxine (SYNTHROID, LEVOTHROID) 25 MCG tablet Take 1.5 tablets by mouth daily. Take on an empty stomach  0  . lisinopril (PRINIVIL,ZESTRIL) 10 MG tablet Take 5 mg by mouth every morning.    . metFORMIN (GLUMETZA) 500 MG (MOD) 24 hr tablet Take 1,000 mg by mouth daily with breakfast.     . metoprolol (LOPRESSOR) 50 MG tablet Take 25 mg by mouth 2 (two) times daily.      . Omega-3 Fatty Acids (FISH OIL) 1200 MG CAPS Take 1,200 mg by mouth 2 (two) times daily.     Marland Kitchen Phenyleph-Doxylamine-DM-APAP (ALKA SELTZER PLUS PO) Take by mouth 2 (two) times daily.     No current facility-administered medications for this visit.     Allergies:   Other    Social History:  The patient  reports that he quit smoking about 42 years ago. His smoking use included cigarettes. He has a 60.00 pack-year smoking history. His smokeless tobacco use includes chew. He reports that he does not drink alcohol or use drugs.    ROS:  Please see the history of present illness.    Otherwise, review of systems positive for none.   All other systems are reviewed and negative.    PHYSICAL EXAM: VS:  BP (!) 144/71   Pulse 60   Ht 5\' 10"  (1.778 m)   Wt 169 lb 6.4  oz (76.8 kg)   BMI 24.31 kg/m  , BMI Body mass index is 24.31 kg/m.   General: Alert, oriented x3, no distress, no evidence of myxedema Head: no evidence of trauma, PERRL, EOMI, no exophtalmos or lid lag, no myxedema, no xanthelasma; normal ears, nose and oropharynx Neck: normal jugular venous pulsations and no hepatojugular reflux; brisk carotid pulses without delay and no carotid bruits Chest: clear to auscultation, no signs of consolidation by percussion or palpation, normal fremitus, symmetrical and full respiratory excursions, healthy left subclavian pacemaker site Cardiovascular: normal position and quality of the apical impulse, regular rhythm, normal first and second heart sounds, no murmurs, rubs or gallops Abdomen: no tenderness or distention, no masses by palpation, no abnormal pulsatility or arterial bruits, normal bowel sounds, no hepatosplenomegaly Extremities: no clubbing, cyanosis or edema; 2+ radial, ulnar and brachial pulses bilaterally; 2+ right femoral, posterior tibial and dorsalis pedis pulses; 2+ left femoral, posterior tibial and dorsalis pedis pulses; no subclavian or femoral bruits Neurological: grossly  nonfocal Psych: Normal mood and affect    EKG:  EKG is ordered today.  It shows atrial paced ventricular sensed rhythm with a long AV delay of 256 ms.  Normal QTC 394 ms, no repolarization abnormality  Lipid Panel    Component Value Date/Time   CHOL 169 02/28/2012 0857   TRIG 104 02/28/2012 0857   HDL 41 02/28/2012 0857   CHOLHDL 4.1 02/28/2012 0857   VLDL 21 02/28/2012 0857   LDLCALC 107 (H) 02/28/2012 0857      Wt Readings from Last 3 Encounters:  11/16/17 169 lb 6.4 oz (76.8 kg)  10/05/16 174 lb 2 oz (79 kg)  09/23/15 172 lb 3.2 oz (78.1 kg)     1. SSS (sick sinus syndrome) ([place])   2. Second degree Mobitz type II incomplete atrioventricular block   3. Pacemaker   4. Coronary artery disease involving native coronary artery of native heart without angina pectoris   5. Hypercholesterolemia   6. Acquired hypothyroidism   7. Syncope, vasovagal   8. Primary central sleep apnea   9. Essential hypertension      ASSESSMENT AND PLAN:  1. Sinus bradycardia: Heart rate histogram distribution is appropriate for his activity level 2. Second degree AV block: He is not device dependent and only require 7.5% ventricular pacing 3. PPM: Device function is normal.  Continue remote downloads every 3 months and yearly office visit. 4. CAD: Asymptomatic, no angina despite being physically activ. 5. Hyperlipidemia: He will send [place] a copy of the labs performed at the [place]. 6. Hypothyroidism: He has symptoms suggestive of insufficient thyroid supplementation and is on a remarkably low dose of levothyroxine.  We will try to see if his recent labs also have a TSH value. 7. Syncope: He had a single episode strongly suggestive of a vasovagal event.  Discussed ways to avoid triggers, reduce likelihood of events by staying well-hydrated, acknowledge prodromal symptoms and avoid syncope by supine position. 8. Complex sleep apnea with both obstructive and central events on BiPAP he reports compliance  with the device..  9. HTN: Recheck blood pressure was 132/72 mmHg.   Current medicines are reviewed at length with the patient today.  The patient does not have concerns regarding medicines.  The following changes have been made:  Stop using over-the-counter medications that contain a decongestant.  Labs/ tests ordered today include:   Orders Placed This Encounter  Procedures  . EKG 12-Lead    Patient Instructions  Dr Sallyanne Kuster recommends  that you continue on your current medications as directed. Please refer to the Current Medication list given to you today.  Remote monitoring is used to monitor your Pacemaker or ICD from home. This monitoring reduces the number of office visits required to check your device to one time per year. It allows [place] to keep an eye on the functioning of your device to ensure it is working properly. You are scheduled for a device check from home on Wednesday, February 27th, 2019. You may send your transmission at any time that day. If you have a wireless device, the transmission will be sent automatically. After your physician reviews your transmission, you will receive a notification with your next transmission date.  Dr Sallyanne Kuster recommends that you schedule a follow-up appointment in 12 months with a pacemaker check. You will receive a reminder letter in the mail two months in advance. If you don't receive a letter, please call our office to schedule the follow-up appointment.  If you need a refill on your cardiac medications before your next appointment, please call your pharmacy.     SignedSanda Klein, MD  11/17/2017 12:56 PM    Sanda Klein, MD, [facility] [phone] office [phone] pager

## 2017-11-17 ENCOUNTER — Encounter: Payer: Self-pay | Admitting: Cardiovascular Disease

## 2017-12-19 LAB — CUP PACEART INCLINIC DEVICE CHECK
Date Time Interrogation Session: 20190211183248
Implantable Lead Implant Date: 20130510
Implantable Lead Location: 753860
MDC IDC LEAD IMPLANT DT: 20130510
MDC IDC LEAD LOCATION: 753859
MDC IDC PG IMPLANT DT: 20130510
Pulse Gen Model: 2210
Pulse Gen Serial Number: 7336473

## 2018-01-04 ENCOUNTER — Ambulatory Visit (INDEPENDENT_AMBULATORY_CARE_PROVIDER_SITE_OTHER): Payer: Medicare Other | Admitting: *Deleted

## 2018-01-04 DIAGNOSIS — I495 Sick sinus syndrome: Secondary | ICD-10-CM

## 2018-01-04 NOTE — Progress Notes (Signed)
Remote pacemaker transmission.   

## 2018-01-05 ENCOUNTER — Encounter: Payer: Self-pay | Admitting: Cardiology

## 2018-01-20 LAB — CUP PACEART REMOTE DEVICE CHECK
Battery Remaining Longevity: 89 mo
Battery Remaining Percentage: 73 %
Battery Voltage: 2.92 V
Brady Statistic AP VP Percent: 3.8 %
Brady Statistic AS VS Percent: 38 %
Brady Statistic RA Percent Paced: 61 %
Implantable Lead Implant Date: 20130510
Implantable Lead Implant Date: 20130510
Implantable Lead Location: 753860
Implantable Pulse Generator Implant Date: 20130510
Lead Channel Impedance Value: 430 Ohm
Lead Channel Pacing Threshold Amplitude: 1 V
Lead Channel Pacing Threshold Pulse Width: 0.4 ms
Lead Channel Pacing Threshold Pulse Width: 0.4 ms
Lead Channel Sensing Intrinsic Amplitude: 5 mV
Lead Channel Setting Pacing Amplitude: 2.5 V
MDC IDC LEAD LOCATION: 753859
MDC IDC MSMT LEADCHNL RV IMPEDANCE VALUE: 490 Ohm
MDC IDC MSMT LEADCHNL RV PACING THRESHOLD AMPLITUDE: 1 V
MDC IDC MSMT LEADCHNL RV SENSING INTR AMPL: 12 mV
MDC IDC PG SERIAL: 7336473
MDC IDC SESS DTM: 20190227083536
MDC IDC SET LEADCHNL RA PACING AMPLITUDE: 2 V
MDC IDC SET LEADCHNL RV PACING PULSEWIDTH: 0.4 ms
MDC IDC SET LEADCHNL RV SENSING SENSITIVITY: 2 mV
MDC IDC STAT BRADY AP VS PERCENT: 58 %
MDC IDC STAT BRADY AS VP PERCENT: 1 %
MDC IDC STAT BRADY RV PERCENT PACED: 3.8 %

## 2018-01-25 ENCOUNTER — Telehealth: Payer: Self-pay | Admitting: Cardiovascular Disease

## 2018-01-25 NOTE — Telephone Encounter (Signed)
   Primary Cardiologist: Sanda Klein, MD  Chart reviewed as part of pre-operative protocol coverage. Patient was contacted 01/25/2018 in reference to pre-operative risk assessment for pending surgery as outlined below.  Lonnie Marshall was last seen on 11/16/17 by Dr. Sallyanne Kuster.  Since that day, Lonnie Marshall has done well from a cardiac standpoint. He denies CP and dyspnea. No exertional symptoms.   Therefore, based on ACC/AHA guidelines, the patient would be at acceptable risk for the planned procedure without further cardiovascular testing.   I will route this recommendation to the requesting party via Epic fax function and remove from pre-op pool.  Please call with questions.  Lyda Jester, PA-C 01/25/2018, 3:57 PM

## 2018-01-25 NOTE — Telephone Encounter (Signed)
New message   Last seen on 11/16/17    [facility] Pre-operative Risk Assessment    Request for surgical clearance:  1. What type of surgery is being performed? Cystoscopy , ureteroscopy and stent placement  2. When is this surgery scheduled? TBD  3. What type of clearance is required (medical clearance vs. Pharmacy clearance to hold med vs. Both)? Medical   4. Are there any medications that need to be held prior to surgery and how long? Patient can remain on any blood thinners he is taking   5. Practice name and name of physician performing surgery?  [name]   6. What is your office phone and fax number? [phone] fax [phone]  Anesthesia type (None, local, MAC, general) ? General   Lonnie Marshall 01/25/2018, 9:42 AM  _________________________________________________________________   (provider comments below)

## 2018-02-01 ENCOUNTER — Other Ambulatory Visit: Payer: Self-pay | Admitting: Urology

## 2018-02-01 ENCOUNTER — Encounter (HOSPITAL_COMMUNITY): Payer: Self-pay | Admitting: *Deleted

## 2018-02-02 NOTE — Patient Instructions (Addendum)
Lonnie Marshall  02/02/2018   Your procedure is scheduled on: 02/07/2018   Report to [facility] Main  Entrance             Report to admitting at    [place] AM   Call this number if you have problems the morning of surgery [phone]    Remember: Do not eat food or drink liquids :After Midnight.   How to Manage Your Diabetes Before and After Surgery  Why is it important to control my blood sugar before and after surgery? . Improving blood sugar levels before and after surgery helps healing and can limit problems. . A way of improving blood sugar control is eating a healthy diet by: o  Eating less sugar and carbohydrates o  Increasing activity/exercise o  Talking with your doctor about reaching your blood sugar goals . High blood sugars (greater than 180 mg/dL) can raise your risk of infections and slow your recovery, so you will need to focus on controlling your diabetes during the weeks before surgery. . Make sure that the doctor who takes care of your diabetes knows about your planned surgery including the date and location.  How do I manage my blood sugar before surgery? . Check your blood sugar at least 4 times a day, starting 2 days before surgery, to make sure that the level is not too high or low. o Check your blood sugar the morning of your surgery when you wake up and every 2 hours until you get to the Short Stay unit. . If your blood sugar is less than 70 mg/dL, you will need to treat for low blood sugar: o Do not take insulin. o Treat a low blood sugar (less than 70 mg/dL) with  cup of clear juice (cranberry or apple), 4 glucose tablets, OR glucose gel. o Recheck blood sugar in 15 minutes after treatment (to make sure it is greater than 70 mg/dL). If your blood sugar is not greater than 70 mg/dL on recheck, call [phone] for further instructions. . Report your blood sugar to the short stay nurse when you get to Short Stay.  . If you are admitted  to the hospital after surgery: o Your blood sugar will be checked by the staff and you will probably be given insulin after surgery (instead of oral diabetes medicines) to make sure you have good blood sugar levels. o The goal for blood sugar control after surgery is 80-180 mg/dL.   WHAT DO I DO ABOUT MY DIABETES MEDICATION?  Marland Kitchen Do not take oral diabetes medicines (pills) the morning of surgery.      Take these medicines the morning of surgery with A SIP OF WATER: Albuterol Inhaler if needed and bring, Zyrtec if needed, Imdur ( isosorbide mononitrate), Proscar, Synthroid, Metoprolol ( Lopressor)               DO NOT TAKE ANY DIABETIC MEDICATIONS DAY OF YOUR SURGERY                               You may not have any metal on your body including hair pins and              piercings  Do not wear jewelry, , lotions, powders or perfumes, deodorant             Do  not wear nail polish.  Do not shave  48 hours prior to surgery.                Do not bring valuables to the hospital. [place].  Contacts, dentures or bridgework may not be worn into surgery.      Patients discharged the day of surgery will not be allowed to drive home.  Name and phone number of your driver:                Please read over the following fact sheets you were given: _____________________________________________________________________             [facility] - Preparing for Surgery Before surgery, you can play an important role.  Because skin is not sterile, your skin needs to be as free of germs as possible.  You can reduce the number of germs on your skin by washing with CHG (chlorahexidine gluconate) soap before surgery.  CHG is an antiseptic cleaner which kills germs and bonds with the skin to continue killing germs even after washing. Please DO NOT use if you have an allergy to CHG or antibacterial soaps.  If your skin becomes reddened/irritated stop using  the CHG and inform your nurse when you arrive at Short Stay. Do not shave (including legs and underarms) for at least 48 hours prior to the first CHG shower.  You may shave your face/neck. Please follow these instructions carefully:  1.  Shower with CHG Soap the night before surgery and the  morning of Surgery.  2.  If you choose to wash your hair, wash your hair first as usual with your  normal  shampoo.  3.  After you shampoo, rinse your hair and body thoroughly to remove the  shampoo.                           4.  Use CHG as you would any other liquid soap.  You can apply chg directly  to the skin and wash                       Gently with a scrungie or clean washcloth.  5.  Apply the CHG Soap to your body ONLY FROM THE NECK DOWN.   Do not use on face/ open                           Wound or open sores. Avoid contact with eyes, ears mouth and genitals (private parts).                       Wash face,  Genitals (private parts) with your normal soap.             6.  Wash thoroughly, paying special attention to the area where your surgery  will be performed.  7.  Thoroughly rinse your body with warm water from the neck down.  8.  DO NOT shower/wash with your normal soap after using and rinsing off  the CHG Soap.                9.  Pat yourself dry with a clean towel.            10.  Wear clean pajamas.  11.  Place clean sheets on your bed the night of your first shower and do not  sleep with pets. Day of Surgery : Do not apply any lotions/deodorants the morning of surgery.  Please wear clean clothes to the hospital/surgery center.  FAILURE TO FOLLOW THESE INSTRUCTIONS MAY RESULT IN THE CANCELLATION OF YOUR SURGERY PATIENT SIGNATURE_________________________________  NURSE SIGNATURE__________________________________  ________________________________________________________________________

## 2018-02-02 NOTE — Progress Notes (Signed)
EKG-11/16/2017- epic  CBC/DIFF and U/A-01/22/2018 on chart  Last Device Check- 01/20/18-epic  LOV- 01/22/18- [facility]  03/07/2017-Office visit- Mark [place],PA on chart  Clearance- 01/25/2018- Cardiology telephone note-epic 11/16/2017-LOV- Cardiology- Dr Loleta Chance - epic

## 2018-02-03 ENCOUNTER — Other Ambulatory Visit: Payer: Self-pay

## 2018-02-03 ENCOUNTER — Telehealth: Payer: Self-pay | Admitting: Cardiovascular Disease

## 2018-02-03 ENCOUNTER — Encounter (HOSPITAL_COMMUNITY): Payer: Self-pay

## 2018-02-03 ENCOUNTER — Encounter (HOSPITAL_COMMUNITY)
Admission: RE | Admit: 2018-02-03 | Discharge: 2018-02-03 | Disposition: A | Discharge status: Home / Self Care | Payer: Medicare Other | Source: Ambulatory Visit | Attending: Urology | Admitting: Urology

## 2018-02-03 DIAGNOSIS — Z01812 Encounter for preprocedural laboratory examination: Secondary | ICD-10-CM | POA: Diagnosis present

## 2018-02-03 DIAGNOSIS — N202 Calculus of kidney with calculus of ureter: Secondary | ICD-10-CM | POA: Insufficient documentation

## 2018-02-03 HISTORY — DX: Personal history of urinary calculi: Z87.442

## 2018-02-03 LAB — GLUCOSE, CAPILLARY: GLUCOSE-CAPILLARY: 180 mg/dL — AB (ref 65–99)

## 2018-02-03 LAB — BASIC METABOLIC PANEL
Anion gap: 12 (ref 5–15)
BUN: 25 mg/dL — AB (ref 6–20)
CHLORIDE: 104 mmol/L (ref 101–111)
CO2: 23 mmol/L (ref 22–32)
CREATININE: 1.52 mg/dL — AB (ref 0.61–1.24)
Calcium: 8.9 mg/dL (ref 8.9–10.3)
GFR calc Af Amer: 48 mL/min — ABNORMAL LOW (ref >60)
GFR calc non Af Amer: 42 mL/min — ABNORMAL LOW (ref >60)
Glucose, Bld: 182 mg/dL — ABNORMAL HIGH (ref 65–99)
POTASSIUM: 4.5 mmol/L (ref 3.5–5.1)
SODIUM: 139 mmol/L (ref 135–145)

## 2018-02-03 LAB — HEMOGLOBIN A1C
Hgb A1c MFr Bld: 7.4 % — ABNORMAL HIGH (ref 4.8–5.6)
Mean Plasma Glucose: 165.68 mg/dL

## 2018-02-03 NOTE — Telephone Encounter (Signed)
Did not need this encounter °

## 2018-02-06 NOTE — H&P (Signed)
Office Visit Report     01/24/2018   --------------------------------------------------------------------------------   Lonnie Marshall  MRN: [id number]  PRIMARY CARE:  [facility] [place], [place]  DOB: July 05, 1936, 82 year old Male  REFERRING:  Robert L. Maceo Pro, MD  SSN: [id number]  PROVIDER:  [name], M.D.    LOCATION:  [facility] [phone]   --------------------------------------------------------------------------------   CC: I have ureteral stone.  HPI: Lonnie Marshall is a 82 year-old male established patient who is here for ureteral stone.  The problem is on the left side. He first stated noticing pain on 01/18/2018. This is not his first kidney stone. He is currently having flank pain. He denies having back pain, groin pain, nausea, vomiting, fever, and chills. Pain is occuring on the left side. He has not caught a stone in his urine strainer since his symptoms began.   He has had ureteroscopy for treatment of his stones in the past.   Patient underwent KUB January 22, 2018 which showed a suspected 10 mm left proximal ureteral stone. He has a history of stones. He had an episode of left hydronephrosis in 2014. He may have passed a stone or had reflux. Ureteroscopy revealed no stone or obstructive findings. He also underwent TURP. Follow-up ultrasound normal.   He's well today. He did see a "speck" Sunday. Urine is clear. UA today with MH and bacteria. Discussed options and he will proceed with CT. I agree with radiologist this is the best way to evaluate for possible stone and to consider tx options. On CT there are 2 stones, one in the proximal ureter about 8 mm (ssd 13 cm, HU 520, visible) and one in the renal pelvis about 8 mm (HU 650).     CC/HPI: I have acute cystitis.    ALLERGIES: No Allergies    MEDICATIONS: Levothyroxine Sodium 25 mcg tablet  Metformin Hcl  Tamsulosin Hcl 0.4 mg capsule  Albuterol Sulfate  Aspirin 81 MG TABS Oral  Atorvastatin Calcium  80 MG Oral Tablet Oral  Clopidogrel Bisulfate 75 MG Oral Tablet Oral  Finasteride 5 mg tablet 0 Oral  Fish Oil CAPS Oral  Isosorbide Mononitrate TABS Oral  Lisinopril 10 MG Oral Tablet Oral  Metoprolol Tartrate 50 MG Oral Tablet Oral  Vitamin D-3 TABS Oral     GU PSH: Cystoscopy Insert Stent - 2014 Cystoscopy TURP - 2014 Cystoscopy Ureteroscopy - 2014 Vasectomy - 2014      PSH Notes: Colon Surgery, Appendectomy, Cystoscopy With Insertion Of Ureteral Stent Left, Cystoscopy With Ureteroscopy Left, Transurethral Resection Of Prostate (TURP), Surgery Of Male Genitalia Vasectomy, Elective Circumcision, Cath Placement Of Stent 1   NON-GU PSH: Appendectomy - 2016    GU PMH: BPH w/o LUTS - 11/29/2016, Benign localized prostatic hyperplasia without lower urinary tract symptoms (LUTS), - 2017 ED due to arterial insufficiency - 11/29/2016 Renal calculus, Nephrolithiasis - 2017 Hydronephrosis Unspec, Hydronephrosis - 2015 Male ED, unspecified, Impotence, organic - 2015 Acute prostatitis, Prostatitis, acute - 2014 Gross hematuria, Gross hematuria - 2014 History of urolithiasis, Nephrolithiasis - 2014 Other microscopic hematuria, Microscopic hematuria - 2014 Urinary Frequency, Increased urinary frequency - 2014 Urinary Retention, Unspec, Incomplete bladder emptying - 2014      PMH Notes:  2013-04-05 08:51:19 - Note: Acute Myocardial Infarction   NON-GU PMH: Encounter for general adult medical examination without abnormal findings, Encounter for preventive health examination - 2017 Personal history of other endocrine, nutritional and metabolic disease, History of hypercholesterolemia - 2014    FAMILY  HISTORY: Acute Myocardial Infarction - Father Death In The Family Father - Runs In Family Death In The Family Mother - Runs In Family Family Health Status Number - Runs In Family Heart Disease - Father Stroke Syndrome - Mother   SOCIAL HISTORY: Marital Status: Married Preferred Language:  English; Ethnicity: Not Hispanic Or Latino; Race: White Current Smoking Status: Patient does not smoke anymore. Has not smoked since 01/25/1975.   Tobacco Use Assessment Completed: Used Tobacco in last 30 days? Has never drank.  Drinks 1 caffeinated drink per day.     Notes: Former smoker, Tobacco use, Caffeine Use, Marital History - Currently Married, Occupation: Retired, Alcohol Use   REVIEW OF SYSTEMS:    GU Review Male:   Patient denies penile pain, leakage of urine, get up at night to urinate, erection problems, frequent urination, trouble starting your stream, have to strain to urinate , burning/ pain with urination, hard to postpone urination, and stream starts and stops.  Gastrointestinal (Upper):   Patient denies nausea, vomiting, and indigestion/ heartburn.  Gastrointestinal (Lower):   Patient denies diarrhea and constipation.  Constitutional:   Patient reports fatigue. Patient denies fever, night sweats, and weight loss.  Skin:   Patient denies skin rash/ lesion and itching.  Eyes:   Patient denies blurred vision and double vision.  Ears/ Nose/ Throat:   Patient denies sore throat and sinus problems.  Hematologic/Lymphatic:   Patient denies swollen glands and easy bruising.  Cardiovascular:   Patient denies leg swelling and chest pains.  Respiratory:   Patient denies cough and shortness of breath.  Endocrine:   Patient denies excessive thirst.  Musculoskeletal:   Patient denies back pain and joint pain.  Neurological:   Patient denies headaches and dizziness.  Psychologic:   Patient denies depression and anxiety.   VITAL SIGNS:      01/24/2018 02:31 PM  Weight 147.7 lb / 67 kg  BP 109/58 mmHg  Pulse 60 /min  Temperature 98.1 F / 36.7 C   MULTI-SYSTEM PHYSICAL EXAMINATION:    Constitutional: Well-nourished. No physical deformities. Normally developed. Good grooming.  Neck: Neck symmetrical, not swollen. Normal tracheal position.  Respiratory: No labored breathing, no use  of accessory muscles. Normal breath sounds.  Cardiovascular: Regular rate and rhythm. No murmur, no gallop. Normal temperature, normal extremity pulses, no swelling, no varicosities.  Skin: No paleness, no jaundice, no cyanosis. No lesion, no ulcer, no rash.  Neurologic / Psychiatric: Oriented to time, oriented to place, oriented to person. No depression, no anxiety, no agitation.  Gastrointestinal: No mass, no tenderness, no rigidity, non obese abdomen.     PAST DATA REVIEWED:  Source Of History:  Patient  X-Ray Review: KUB: Reviewed Films. mar 2019 C.T. Abdomen/Pelvis: Reviewed Films. today    11/19/15  PSA  Total PSA 0.32     PROCEDURES:         C.T. Urogram - [id number]               Urinalysis w/Scope - 81001 Dipstick Dipstick Cont'd Micro  Color: Yellow Bilirubin: Neg WBC/hpf: NS (Not Seen)  Appearance: Clear Ketones: Neg RBC/hpf: 3 - 10/hpf  Specific Gravity: 1.020 Blood: 2+ Bacteria: Mod (26-50/hpf)  pH: <=5.0 Protein: 1+ Cystals: NS (Not Seen)  Glucose: Trace Urobilinogen: 0.2 Casts: NS (Not Seen)    Nitrites: Neg Trichomonas: Not Present    Leukocyte Esterase: Neg Mucous: Not Present      Epithelial Cells: 0 - 5/hpf      Yeast: NS (Not  Seen)      Sperm: Not Present    Notes:      ASSESSMENT:      ICD-10 Details  1 GU:   Ureteral calculus - N20.1   2   Renal calculus - N20.0 Stable   PLAN:           Orders Labs Urine Culture  X-Rays: C.T. Stone Protocol Without Contrast  X-Ray Notes: . History:  Hematuria: Yes/No  Patient to see MD after exam: Yes/No  Previous exam: CT / IVP/ US/ KUB/ None  When:  Where:  Diabetic: Yes/ No  BUN/ Creatinine:  Date of last BUN Creatinine:  Weight in pounds:  Allergy- IV Contrast: Yes/ No  Conflicting diabetic meds: Yes/ No  Diabetic Meds:  Prior Authorization #: NA per [facility]           Schedule         Document Letter(s):  Created for Patient: Clinical Summary         Notes:   Left renal and  ureteral calculus- I discussed with the patient and his wife the nature risks and benefits of continued stone passage, off label use of alpha blockers, shockwave lithotripsy or ureteroscopy. All questions answered. He'll proceed with ureteroscopy. He has 2 stones and is on Plavix for heart disease and would have to come off that for shockwave. He can continue that for ureteroscopy.   cc: PA Helper;  Dr. Sallyanne Kuster     cc: PA Helper     * Signed by [name], M.D. on 01/24/18 at 4:42 PM (EDT)*     The information contained in this medical record document is considered private and confidential patient information. This information can only be used for the medical diagnosis and/or medical services that are being provided by the patient's selected caregivers. This information can only be distributed outside of the patient's care if the patient agrees and signs waivers of authorization for this information to be sent to an outside source or route.  Addendum: Urine culture negative

## 2018-02-07 ENCOUNTER — Ambulatory Visit (HOSPITAL_COMMUNITY): Payer: Medicare Other | Admitting: Anesthesiology

## 2018-02-07 ENCOUNTER — Other Ambulatory Visit: Payer: Self-pay

## 2018-02-07 ENCOUNTER — Ambulatory Visit (HOSPITAL_COMMUNITY): Payer: Medicare Other

## 2018-02-07 ENCOUNTER — Ambulatory Visit (HOSPITAL_COMMUNITY)
Admission: RE | Admit: 2018-02-07 | Discharge: 2018-02-07 | Disposition: A | Discharge status: Home / Self Care | Payer: Medicare Other | Source: Ambulatory Visit | Attending: Urology | Admitting: Urology

## 2018-02-07 ENCOUNTER — Encounter (HOSPITAL_COMMUNITY): Payer: Self-pay | Admitting: Emergency Medicine

## 2018-02-07 ENCOUNTER — Encounter (HOSPITAL_COMMUNITY)
Admission: RE | Disposition: A | Discharge status: Home / Self Care | Payer: Self-pay | Source: Ambulatory Visit | Attending: Urology

## 2018-02-07 DIAGNOSIS — Z79890 Hormone replacement therapy (postmenopausal): Secondary | ICD-10-CM | POA: Diagnosis not present

## 2018-02-07 DIAGNOSIS — Z950 Presence of cardiac pacemaker: Secondary | ICD-10-CM | POA: Diagnosis not present

## 2018-02-07 DIAGNOSIS — Z7982 Long term (current) use of aspirin: Secondary | ICD-10-CM | POA: Diagnosis not present

## 2018-02-07 DIAGNOSIS — N201 Calculus of ureter: Secondary | ICD-10-CM

## 2018-02-07 DIAGNOSIS — Z87891 Personal history of nicotine dependence: Secondary | ICD-10-CM | POA: Insufficient documentation

## 2018-02-07 DIAGNOSIS — I2510 Atherosclerotic heart disease of native coronary artery without angina pectoris: Secondary | ICD-10-CM | POA: Insufficient documentation

## 2018-02-07 DIAGNOSIS — E119 Type 2 diabetes mellitus without complications: Secondary | ICD-10-CM | POA: Insufficient documentation

## 2018-02-07 DIAGNOSIS — N132 Hydronephrosis with renal and ureteral calculous obstruction: Secondary | ICD-10-CM | POA: Diagnosis not present

## 2018-02-07 DIAGNOSIS — E7800 Pure hypercholesterolemia, unspecified: Secondary | ICD-10-CM | POA: Insufficient documentation

## 2018-02-07 DIAGNOSIS — I10 Essential (primary) hypertension: Secondary | ICD-10-CM | POA: Insufficient documentation

## 2018-02-07 DIAGNOSIS — Z79899 Other long term (current) drug therapy: Secondary | ICD-10-CM | POA: Diagnosis not present

## 2018-02-07 DIAGNOSIS — G4730 Sleep apnea, unspecified: Secondary | ICD-10-CM | POA: Insufficient documentation

## 2018-02-07 DIAGNOSIS — I252 Old myocardial infarction: Secondary | ICD-10-CM | POA: Insufficient documentation

## 2018-02-07 DIAGNOSIS — Z7984 Long term (current) use of oral hypoglycemic drugs: Secondary | ICD-10-CM | POA: Diagnosis not present

## 2018-02-07 HISTORY — PX: CYSTOSCOPY/URETEROSCOPY/HOLMIUM LASER/STENT PLACEMENT: SHX6546

## 2018-02-07 LAB — GLUCOSE, CAPILLARY
GLUCOSE-CAPILLARY: 119 mg/dL — AB (ref 65–99)
GLUCOSE-CAPILLARY: 142 mg/dL — AB (ref 65–99)

## 2018-02-07 SURGERY — CYSTOSCOPY/URETEROSCOPY/HOLMIUM LASER/STENT PLACEMENT
Anesthesia: General | Site: Urethra | Laterality: Left

## 2018-02-07 MED ORDER — LIDOCAINE 2% (20 MG/ML) 5 ML SYRINGE
INTRAMUSCULAR | Status: DC | PRN
  Administered 2018-02-07: 100 mg via INTRAVENOUS

## 2018-02-07 MED ORDER — SODIUM CHLORIDE 0.9 % IR SOLN
Status: DC | PRN
  Administered 2018-02-07: 2000 mL

## 2018-02-07 MED ORDER — PHENYLEPHRINE HCL 10 MG/ML IJ SOLN
INTRAVENOUS | Status: DC | PRN
  Administered 2018-02-07: 25 ug/min via INTRAVENOUS

## 2018-02-07 MED ORDER — ONDANSETRON HCL 4 MG/2ML IJ SOLN
INTRAMUSCULAR | Status: DC | PRN
  Administered 2018-02-07: 4 mg via INTRAVENOUS

## 2018-02-07 MED ORDER — SODIUM CHLORIDE 0.9 % IR SOLN
Status: DC | PRN
  Administered 2018-02-07: 1000 mL

## 2018-02-07 MED ORDER — CEFAZOLIN SODIUM-DEXTROSE 2-4 GM/100ML-% IV SOLN
INTRAVENOUS | Status: AC
  Filled 2018-02-07: qty 100

## 2018-02-07 MED ORDER — LIDOCAINE 2% (20 MG/ML) 5 ML SYRINGE
INTRAMUSCULAR | Status: AC
  Filled 2018-02-07: qty 5

## 2018-02-07 MED ORDER — PHENYLEPHRINE 40 MCG/ML (10ML) SYRINGE FOR IV PUSH (FOR BLOOD PRESSURE SUPPORT)
PREFILLED_SYRINGE | INTRAVENOUS | Status: DC | PRN
  Administered 2018-02-07 (×5): 80 ug via INTRAVENOUS

## 2018-02-07 MED ORDER — SODIUM CHLORIDE 0.9 % IR SOLN
Status: DC | PRN
  Administered 2018-02-07: 3000 mL

## 2018-02-07 MED ORDER — PROPOFOL 10 MG/ML IV BOLUS
INTRAVENOUS | Status: DC | PRN
  Administered 2018-02-07: 150 mg via INTRAVENOUS

## 2018-02-07 MED ORDER — CEPHALEXIN 500 MG PO CAPS: 500.0000 mg | ORAL_CAPSULE | Freq: Every day | ORAL | 0 refills | Status: AC

## 2018-02-07 MED ORDER — LACTATED RINGERS IV SOLN
INTRAVENOUS | Status: DC
  Administered 2018-02-07: 09:00:00 via INTRAVENOUS

## 2018-02-07 MED ORDER — FENTANYL CITRATE (PF) 100 MCG/2ML IJ SOLN
INTRAMUSCULAR | Status: DC | PRN
  Administered 2018-02-07 (×2): 50 ug via INTRAVENOUS

## 2018-02-07 MED ORDER — FENTANYL CITRATE (PF) 100 MCG/2ML IJ SOLN
INTRAMUSCULAR | Status: AC
  Filled 2018-02-07: qty 2

## 2018-02-07 MED ORDER — PROPOFOL 10 MG/ML IV BOLUS
INTRAVENOUS | Status: AC
  Filled 2018-02-07: qty 20

## 2018-02-07 MED ORDER — CEFAZOLIN SODIUM-DEXTROSE 2-4 GM/100ML-% IV SOLN
2.0000 g | Freq: Once | INTRAVENOUS | Status: AC
  Administered 2018-02-07: 2 g via INTRAVENOUS

## 2018-02-07 MED ORDER — DEXAMETHASONE SODIUM PHOSPHATE 10 MG/ML IJ SOLN
INTRAMUSCULAR | Status: DC | PRN
  Administered 2018-02-07: 10 mg via INTRAVENOUS

## 2018-02-07 MED ORDER — FENTANYL CITRATE (PF) 100 MCG/2ML IJ SOLN: 25.0000 ug | INTRAMUSCULAR | Status: DC | PRN

## 2018-02-07 SURGICAL SUPPLY — 23 items
BAG URO CATCHER STRL LF (MISCELLANEOUS) ×3 IMPLANT
BASKET ZERO TIP NITINOL 2.4FR (BASKET) ×3 IMPLANT
BSKT STON RTRVL ZERO TP 2.4FR (BASKET) ×1
CATH INTERMIT  6FR 70CM (CATHETERS) ×3 IMPLANT
CATH URET 5FR 28IN CONE TIP (BALLOONS) ×2
CATH URET 5FR 70CM CONE TIP (BALLOONS) ×1 IMPLANT
CATH URET WHISTLE 6FR (CATHETERS) ×1 IMPLANT
CLOTH BEACON ORANGE TIMEOUT ST (SAFETY) ×3 IMPLANT
COVER FOOTSWITCH UNIV (MISCELLANEOUS) ×2 IMPLANT
COVER SURGICAL LIGHT HANDLE (MISCELLANEOUS) ×1 IMPLANT
FIBER LASER TRAC TIP (UROLOGICAL SUPPLIES) ×2 IMPLANT
GLOVE BIO SURGEON STRL SZ7.5 (GLOVE) ×3 IMPLANT
GOWN STRL REUS W/TWL XL LVL3 (GOWN DISPOSABLE) ×3 IMPLANT
GUIDEWIRE ANG ZIPWIRE 038X150 (WIRE) ×2 IMPLANT
GUIDEWIRE STR DUAL SENSOR (WIRE) ×3 IMPLANT
MANIFOLD NEPTUNE II (INSTRUMENTS) ×3 IMPLANT
PACK CYSTO (CUSTOM PROCEDURE TRAY) ×3 IMPLANT
SHEATH ACCESS URETERAL 24CM (SHEATH) ×1 IMPLANT
SHEATH URETERAL 12FRX35CM (MISCELLANEOUS) ×1 IMPLANT
STENT CONTOUR 6FRX26X.038 (STENTS) ×2 IMPLANT
TUBING CONNECTING 10 (TUBING) ×2 IMPLANT
TUBING CONNECTING 10' (TUBING) ×1
WIRE COONS/BENSON .038X145CM (WIRE) ×1 IMPLANT

## 2018-02-07 NOTE — Progress Notes (Signed)
Dr. Nyoka Cowden notified that pt did not take metoprolol this morning. HR 60 on arrival to short stay. Was told to hold off on giving pt this medication at this time.

## 2018-02-07 NOTE — Anesthesia Postprocedure Evaluation (Signed)
Anesthesia Post Note  Patient: Lonnie Marshall.  Procedure(s) Performed: CYSTOSCOPY/LEFT RETROGRADE/LEFT URETEROSCOPY/HOLMIUM LASER LEFT /STENT PLACEMENT (Left Urethra)     Patient location during evaluation: PACU Anesthesia Type: General Pain management: pain level controlled Vital Signs Assessment: post-procedure vital signs reviewed and stable Respiratory status: spontaneous breathing Cardiovascular status: stable Anesthetic complications: no    Last Vitals:  Vitals:   02/07/18 1200 02/07/18 1214  BP: (!) 154/66   Pulse: 60   Resp: 12   Temp:  36.9 C  SpO2: 97%     Last Pain:  Vitals:   02/07/18 1240  TempSrc:   PainSc: 0-No pain                 Lailani Tool

## 2018-02-07 NOTE — Anesthesia Procedure Notes (Signed)
Procedure Name: LMA Insertion Date/Time: 02/07/2018 10:14 AM Performed by: Lind Covert, CRNA Pre-anesthesia Checklist: Emergency Drugs available, Patient identified, Suction available, Patient being monitored and Timeout performed Patient Re-evaluated:Patient Re-evaluated prior to induction Oxygen Delivery Method: Circle system utilized Preoxygenation: Pre-oxygenation with 100% oxygen Induction Type: IV induction LMA: LMA inserted LMA Size: 5.0 Placement Confirmation: positive ETCO2 and breath sounds checked- equal and bilateral Tube secured with: Tape Dental Injury: Teeth and Oropharynx as per pre-operative assessment

## 2018-02-07 NOTE — Interval H&P Note (Signed)
History and Physical Interval Note:  02/07/2018 10:11 AM  Lonnie Marshall.  has presented today for surgery, with the diagnosis of LEFT RENAL STONE, LEFT URETERAL STONE  The various methods of treatment have been discussed with the patient and family. After consideration of risks, benefits and other options for treatment, the patient has consented to  Procedure(s) with comments: CYSTOSCOPY/RETROGRADE/URETEROSCOPY/HOLMIUM LASER/STENT PLACEMENT (Left) - ONLY NEEDS 60 MIN as a surgical intervention .  The patient's history has been reviewed, patient examined, no change in status, stable for surgery.  I have reviewed the patient's chart and labs.  He has not had further flank pain.  No gross hematuria.  He had some mild dysuria the other day which cleared.  No fever. No stone passage. Discussed again with patient and family possibility of staged procedure, pre-stent.  Questions were answered to the patient's satisfaction.     [name]

## 2018-02-07 NOTE — Transfer of Care (Signed)
Immediate Anesthesia Transfer of Care Note  Patient: Lonnie Marshall.  Procedure(s) Performed: CYSTOSCOPY/LEFT RETROGRADE/LEFT URETEROSCOPY/HOLMIUM LASER LEFT /STENT PLACEMENT (Left Urethra)  Patient Location: PACU  Anesthesia Type:General  Level of Consciousness: sedated  Airway & Oxygen Therapy: Patient Spontanous Breathing and Patient connected to face mask oxygen  Post-op Assessment: Report given to RN and Post -op Vital signs reviewed and stable  Post vital signs: Reviewed and stable  Last Vitals:  Vitals Value Taken Time  BP    Temp    Pulse 59 02/07/2018 11:44 AM  Resp 9 02/07/2018 11:44 AM  SpO2 100 % 02/07/2018 11:44 AM  Vitals shown include unvalidated device data.  Last Pain:  Vitals:   02/07/18 0850  TempSrc:   PainSc: 0-No pain      Patients Stated Pain Goal: 4 (10/26/74 8832)  Complications: No apparent anesthesia complications

## 2018-02-07 NOTE — Anesthesia Preprocedure Evaluation (Signed)
Anesthesia Evaluation  Patient identified by MRN, date of birth, ID band Patient awake    Reviewed: Allergy & Precautions, NPO status , Patient's Chart, lab work & pertinent test results  Airway Mallampati: II  TM Distance: >3 FB     Dental   Pulmonary sleep apnea , former smoker,    breath sounds clear to auscultation       Cardiovascular hypertension, + CAD and + Past MI  + dysrhythmias + pacemaker  Rhythm:Regular Rate:Normal     Neuro/Psych    GI/Hepatic negative GI ROS, Neg liver ROS,   Endo/Other  diabetes  Renal/GU Renal disease     Musculoskeletal   Abdominal   Peds  Hematology   Anesthesia Other Findings   Reproductive/Obstetrics                             Anesthesia Physical Anesthesia Plan  ASA: III  Anesthesia Plan: General   Post-op Pain Management:    Induction: Intravenous  PONV Risk Score and Plan: Treatment may vary due to age or medical condition  Airway Management Planned: Simple Face Mask and LMA  Additional Equipment:   Intra-op Plan:   Post-operative Plan: Extubation in OR  Informed Consent: I have reviewed the patients History and Physical, chart, labs and discussed the procedure including the risks, benefits and alternatives for the proposed anesthesia with the patient or authorized representative who has indicated his/her understanding and acceptance.   Dental advisory given  Plan Discussed with: CRNA and Anesthesiologist  Anesthesia Plan Comments:         Anesthesia Quick Evaluation

## 2018-02-07 NOTE — Discharge Instructions (Signed)
Dietary Guidelines to Help Prevent Kidney Stones Kidney stones are deposits of minerals and salts that form inside your kidneys. Your risk of developing kidney stones may be greater depending on your diet, your lifestyle, the medicines you take, and whether you have certain medical conditions. Most people can reduce their chances of developing kidney stones by following the instructions below. Depending on your overall health and the type of kidney stones you tend to develop, your dietitian may give you more specific instructions. What are tips for following this plan? Reading food labels  Choose foods with "no salt added" or "low-salt" labels. Limit your sodium intake to less than 1500 mg per day.  Choose foods with calcium for each meal and snack. Try to eat about 300 mg of calcium at each meal. Foods that contain 200-500 mg of calcium per serving include: ? 8 oz (237 ml) of milk, fortified nondairy milk, and fortified fruit juice. ? 8 oz (237 ml) of kefir, yogurt, and soy yogurt. ? 4 oz (118 ml) of tofu. ? 1 oz of cheese. ? 1 cup (300 g) of dried figs. ? 1 cup (91 g) of cooked broccoli. ? 1-3 oz can of sardines or mackerel.  Most people need 1000 to 1500 mg of calcium each day. Talk to your dietitian about how much calcium is recommended for you. Shopping  Buy plenty of fresh fruits and vegetables. Most people do not need to avoid fruits and vegetables, even if they contain nutrients that may contribute to kidney stones.  When shopping for convenience foods, choose: ? Whole pieces of fruit. ? Premade salads with dressing on the side. ? Low-fat fruit and yogurt smoothies.  Avoid buying frozen meals or prepared deli foods.  Look for foods with live cultures, such as yogurt and kefir. Cooking  Do not add salt to food when cooking. Place a salt shaker on the table and allow each person to add his or her own salt to taste.  Use vegetable protein, such as beans, textured vegetable  protein (TVP), or tofu instead of meat in pasta, casseroles, and soups. Meal planning  Eat less salt, if told by your dietitian. To do this: ? Avoid eating processed or premade food. ? Avoid eating fast food.  Eat less animal protein, including cheese, meat, poultry, or fish, if told by your dietitian. To do this: ? Limit the number of times you have meat, poultry, fish, or cheese each week. Eat a diet free of meat at least 2 days a week. ? Eat only one serving each day of meat, poultry, fish, or seafood. ? When you prepare animal protein, cut pieces into small portion sizes. For most meat and fish, one serving is about the size of one deck of cards.  Eat at least 5 servings of fresh fruits and vegetables each day. To do this: ? Keep fruits and vegetables on hand for snacks. ? Eat 1 piece of fruit or a handful of berries with breakfast. ? Have a salad and fruit at lunch. ? Have two kinds of vegetables at dinner.  Limit foods that are high in a substance called oxalate. These include: ? Spinach. ? Rhubarb. ? Beets. ? Potato chips and french fries. ? Nuts.  If you regularly take a diuretic medicine, make sure to eat at least 1-2 fruits or vegetables high in potassium each day. These include: ? Avocado. ? Banana. ? Orange, prune, carrot, or tomato juice. ? Baked potato. ? Cabbage. ? Beans and split  peas. General instructions  Drink enough fluid to keep your urine clear or pale yellow. This is the most important thing you can do.  Talk to your health care provider and dietitian about taking daily supplements. Depending on your health and the cause of your kidney stones, you may be advised: ? Not to take supplements with vitamin C. ? To take a calcium supplement. ? To take a daily probiotic supplement. ? To take other supplements such as magnesium, fish oil, or vitamin B6.  Take all medicines and supplements as told by your health care provider.  Limit alcohol intake to no more  than 1 drink a day for nonpregnant women and 2 drinks a day for men. One drink equals 12 oz of beer, 5 oz of wine, or 1 oz of hard liquor.  Lose weight if told by your health care provider. Work with your dietitian to find strategies and an eating plan that works best for you. What foods are not recommended? Limit your intake of the following foods, or as told by your dietitian. Talk to your dietitian about specific foods you should avoid based on the type of kidney stones and your overall health. Grains Breads. Bagels. Rolls. Baked goods. Salted crackers. Cereal. Pasta. Vegetables Spinach. Rhubarb. Beets. Canned vegetables. Angie Fava. Olives. Meats and other protein foods Nuts. Nut butters. Large portions of meat, poultry, or fish. Salted or cured meats. Deli meats. Hot dogs. Sausages. Dairy Cheese. Beverages Regular soft drinks. Regular vegetable juice. Seasonings and other foods Seasoning blends with salt. Salad dressings. Canned soups. Soy sauce. Ketchup. Barbecue sauce. Canned pasta sauce. Casseroles. Pizza. Lasagna. Frozen meals. Potato chips. [place] fries. Summary  You can reduce your risk of kidney stones by making changes to your diet.  The most important thing you can do is drink enough fluid. You should drink enough fluid to keep your urine clear or pale yellow.  Ask your health care provider or dietitian how much protein from animal sources you should eat each day, and also how much salt and calcium you should have each day. This information is not intended to replace advice given to you by your health care provider. Make sure you discuss any questions you have with your health care provider. Document Released: 02/19/2011 Document Revised: 10/05/2016 Document Reviewed: 10/05/2016 Elsevier Interactive Patient Education  2018 [place]. Ureteral Stent Implantation, Care After Refer to this sheet in the next few weeks. These instructions provide you with information about  caring for yourself after your procedure. Your health care provider may also give you more specific instructions. Your treatment has been planned according to current medical practices, but problems sometimes occur. Call your health care provider if you have any problems or questions after your procedure.  Remove the stent in 6 days, on Monday morning, February 13, 2018 by pulling the string until the entire stent is removed.  What can I expect after the procedure? After the procedure, it is common to have:  Nausea.  Mild pain when you urinate. You may feel this pain in your lower back or lower abdomen. Pain should stop within a few minutes after you urinate. This may last for up to 1 week.  A small amount of blood in your urine for several days.  Follow these instructions at home:  Medicines  Take over-the-counter and prescription medicines only as told by your health care provider.  If you were prescribed an antibiotic medicine, take it as told by your health care provider. Do not  stop taking the antibiotic even if you start to feel better.  Do not drive for 24 hours if you received a sedative.  Do not drive or operate heavy machinery while taking prescription pain medicines. Activity  Return to your normal activities as told by your health care provider. Ask your health care provider what activities are safe for you.  Do not lift anything that is heavier than 10 lb (4.5 kg). Follow this limit for 1 week after your procedure, or for as long as told by your health care provider. General instructions  Watch for any blood in your urine. Call your health care provider if the amount of blood in your urine increases.  If you have a catheter: ? Follow instructions from your health care provider about taking care of your catheter and collection bag. ? Do not take baths, swim, or use a hot tub until your health care provider approves.  Drink enough fluid to keep your urine clear or pale  yellow.  Keep all follow-up visits as told by your health care provider. This is important. Contact a health care provider if:  You have pain that gets worse or does not get better with medicine, especially pain when you urinate.  You have difficulty urinating.  You feel nauseous or you vomit repeatedly during a period of more than 2 days after the procedure. Get help right away if:  Your urine is dark red or has blood clots in it.  You are leaking urine (have incontinence).  The end of the stent comes out of your urethra.  You cannot urinate.  You have sudden, sharp, or severe pain in your abdomen or lower back.  You have a fever. This information is not intended to replace advice given to you by your health care provider. Make sure you discuss any questions you have with your health care provider. Document Released: 06/27/2013 Document Revised: 04/01/2016 Document Reviewed: 05/09/2015 Elsevier Interactive Patient Education  [employer].

## 2018-02-07 NOTE — Op Note (Signed)
Preoperative diagnosis: Left ureteral and left renal stone Postoperative diagnosis: Left ureteral stones  Procedure: Cystoscopy with left retrograde pyelogram, left ureteroscopy with holmium laser lithotripsy, stone basket extraction and ureteral stent placement  Surgeon: Junious Silk  Anesthesia: General  Indication for procedure: 82 year old with imaging revealing and a left proximal stone with moderate to severe left hydroureter nephrosis and another stone in the left renal pelvis.  The stones were 8-10 mm each.  He was brought today for definitive stone management.  Findings: On cystoscopy there was evidence of prior TURP defect with a patent prostatic urethra and bladder neck.  There was significant edema over the left ureteral orifice and left bladder wall and on ureteroscopy both stones had dropped to the left distal ureter and more at the ureterovesical junction.  Left retrograde pyelogram-this outlined a single ureter single collecting system unit with 2 large filling defects in the left distal ureter and severe hydroureteronephrosis proximally with tortuosity of the ureter in the pelvis and up near the kidney.  The collecting system was massively dilated, but no other filling defects.  Description of procedure: After consent was obtained the patient was brought to the operating room.  After adequate anesthesia he was placed in lithotomy position and prepped and draped in the usual sterile fashion.  A timeout was performed to confirm the patient and procedure.  The cystoscope was passed per urethra and the bladder inspected.  No stone or foreign body in the bladder.  The left ureteral orifice cannulated with a 6 [place] open-ended catheter and retrograde injection of contrast was performed.  This outlined 2 large stones in the distal ureter and severe proximal hydroureteronephrosis.  I passed a sensor wire but it would not progress through the tortuosity from the distal to mid ureter and therefore  a Glidewire was used.  The Glidewire initially advanced into the proximal ureter but then backed out and I could not get it to go again.  Therefore I replaced the sensor wire and this time guided to go in the proximal ureter but no further.  The sensor would not advance into the renal pelvis.  The open-ended catheter was advanced to the proximal ureter and the wire removed.  A good hydronephrotic drip was seen.  Contrast was injected to confirm location in the lumen.  I then used a Glidewire to get into the collecting system and advance the open-ended into the collecting system which straightened out the ureter   All the way up.  Again I remove the wire and a good drip was seen and contrast confirmed location in the collecting system.  The sensor wire was replaced given its stiffer nature.  The 6 [place] open-ended catheter was removed.  The bladder was drained and the scope removed.  I then passed a semirigid scope and just inside the UVJ a large stone was noted and it was fragmented at 0.8 and 8 and 0.3 and 50.  It broke up well.  I then dropped all the pieces into the bladder with a 0 tip basket. I then swapped out for the cystoscope  And drained the bladder and some of the pieces.  I then repassed the semirigid into the ureter and the other large stone was noted and broken up nicely.  These pieces were also dropped into the bladder.  I was able to navigate the ureteroscope well up to the iliac vessels at this point and no other stones were noted.  I reviewed the CT again and there were no  other stones of significance in the system.  Therefore, the semirigid was backed out and no other stone fragments were noted nor ureteral injury.  The cystoscope was passed and the bladder drained and any fragments removed.  I then backloaded the wire on the cystoscope and passed a 6 x 26 cm stent and the wire was removed revealing a good coil in the upper pole collecting system and a good coil in the bladder.  As dilated as he  was the stent could slide down but it was in a good position.  The bladder was drained and the scope removed.  The string was secured to the patient he was awakened and taken to the recovery room in stable condition.  Complications: None  Blood loss: Minimal  Specimens to office lab: Stone fragments  Drains: 6 x 26 cm left ureteral stent with tether  Disposition: Patient stable to PACU

## 2018-02-08 ENCOUNTER — Encounter (HOSPITAL_COMMUNITY): Payer: Self-pay | Admitting: Urology

## 2018-04-05 ENCOUNTER — Ambulatory Visit (INDEPENDENT_AMBULATORY_CARE_PROVIDER_SITE_OTHER): Payer: Medicare Other | Admitting: *Deleted

## 2018-04-05 DIAGNOSIS — I441 Atrioventricular block, second degree: Secondary | ICD-10-CM

## 2018-04-05 NOTE — Progress Notes (Signed)
Remote pacemaker transmission.   

## 2018-04-25 LAB — CUP PACEART REMOTE DEVICE CHECK
Battery Remaining Percentage: 73 %
Battery Voltage: 2.92 V
Brady Statistic AP VP Percent: 5.3 %
Brady Statistic AP VS Percent: 51 %
Brady Statistic AS VS Percent: 44 %
Date Time Interrogation Session: 20190529070701
Implantable Lead Implant Date: 20130510
Implantable Lead Location: 753859
Implantable Pulse Generator Implant Date: 20130510
Lead Channel Impedance Value: 390 Ohm
Lead Channel Impedance Value: 390 Ohm
Lead Channel Pacing Threshold Amplitude: 1 V
Lead Channel Pacing Threshold Amplitude: 1 V
Lead Channel Pacing Threshold Pulse Width: 0.4 ms
Lead Channel Sensing Intrinsic Amplitude: 10.8 mV
Lead Channel Sensing Intrinsic Amplitude: 4.9 mV
Lead Channel Setting Pacing Amplitude: 2 V
Lead Channel Setting Pacing Amplitude: 2.5 V
MDC IDC LEAD IMPLANT DT: 20130510
MDC IDC LEAD LOCATION: 753860
MDC IDC MSMT BATTERY REMAINING LONGEVITY: 87 mo
MDC IDC MSMT LEADCHNL RA PACING THRESHOLD PULSEWIDTH: 0.4 ms
MDC IDC PG SERIAL: 7336473
MDC IDC SET LEADCHNL RV PACING PULSEWIDTH: 0.4 ms
MDC IDC SET LEADCHNL RV SENSING SENSITIVITY: 2 mV
MDC IDC STAT BRADY AS VP PERCENT: 1 %
MDC IDC STAT BRADY RA PERCENT PACED: 55 %
MDC IDC STAT BRADY RV PERCENT PACED: 5.3 %

## 2018-07-05 ENCOUNTER — Ambulatory Visit (INDEPENDENT_AMBULATORY_CARE_PROVIDER_SITE_OTHER): Payer: Medicare Other | Admitting: *Deleted

## 2018-07-05 DIAGNOSIS — I495 Sick sinus syndrome: Secondary | ICD-10-CM

## 2018-07-05 NOTE — Progress Notes (Signed)
Remote pacemaker transmission.   

## 2018-07-06 ENCOUNTER — Encounter: Payer: Self-pay | Admitting: Cardiology

## 2018-08-07 LAB — CUP PACEART REMOTE DEVICE CHECK
Battery Voltage: 2.92 V
Brady Statistic AP VP Percent: 8.3 %
Brady Statistic AP VS Percent: 51 %
Brady Statistic AS VP Percent: 1 %
Brady Statistic AS VS Percent: 40 %
Brady Statistic RV Percent Paced: 8.3 %
Implantable Lead Implant Date: 20130510
Implantable Lead Location: 753859
Lead Channel Impedance Value: 430 Ohm
Lead Channel Pacing Threshold Amplitude: 1 V
Lead Channel Pacing Threshold Amplitude: 1 V
Lead Channel Pacing Threshold Pulse Width: 0.4 ms
Lead Channel Sensing Intrinsic Amplitude: 12 mV
Lead Channel Setting Pacing Amplitude: 2.5 V
Lead Channel Setting Pacing Pulse Width: 0.4 ms
MDC IDC LEAD IMPLANT DT: 20130510
MDC IDC LEAD LOCATION: 753860
MDC IDC MSMT BATTERY REMAINING LONGEVITY: 88 mo
MDC IDC MSMT BATTERY REMAINING PERCENTAGE: 73 %
MDC IDC MSMT LEADCHNL RA IMPEDANCE VALUE: 390 Ohm
MDC IDC MSMT LEADCHNL RA PACING THRESHOLD PULSEWIDTH: 0.4 ms
MDC IDC MSMT LEADCHNL RA SENSING INTR AMPL: 5 mV
MDC IDC PG IMPLANT DT: 20130510
MDC IDC PG SERIAL: 7336473
MDC IDC SESS DTM: 20190828073838
MDC IDC SET LEADCHNL RA PACING AMPLITUDE: 2 V
MDC IDC SET LEADCHNL RV SENSING SENSITIVITY: 2 mV
MDC IDC STAT BRADY RA PERCENT PACED: 59 %

## 2018-10-04 ENCOUNTER — Ambulatory Visit (INDEPENDENT_AMBULATORY_CARE_PROVIDER_SITE_OTHER): Payer: Medicare Other

## 2018-10-04 DIAGNOSIS — I441 Atrioventricular block, second degree: Secondary | ICD-10-CM | POA: Diagnosis not present

## 2018-10-04 DIAGNOSIS — R001 Bradycardia, unspecified: Secondary | ICD-10-CM

## 2018-10-04 NOTE — Progress Notes (Signed)
Remote pacemaker transmission.   

## 2018-11-24 LAB — CUP PACEART REMOTE DEVICE CHECK
Battery Remaining Longevity: 86 mo
Brady Statistic AP VP Percent: 8.5 %
Brady Statistic AP VS Percent: 51 %
Brady Statistic AS VP Percent: 1 %
Brady Statistic AS VS Percent: 40 %
Brady Statistic RV Percent Paced: 8.6 %
Date Time Interrogation Session: 20191127080726
Implantable Lead Implant Date: 20130510
Implantable Lead Location: 753859
Lead Channel Impedance Value: 390 Ohm
Lead Channel Pacing Threshold Amplitude: 1 V
Lead Channel Pacing Threshold Pulse Width: 0.4 ms
Lead Channel Sensing Intrinsic Amplitude: 12 mV
Lead Channel Setting Pacing Amplitude: 2 V
Lead Channel Setting Pacing Amplitude: 2.5 V
Lead Channel Setting Pacing Pulse Width: 0.4 ms
MDC IDC LEAD IMPLANT DT: 20130510
MDC IDC LEAD LOCATION: 753860
MDC IDC MSMT BATTERY REMAINING PERCENTAGE: 73 %
MDC IDC MSMT BATTERY VOLTAGE: 2.92 V
MDC IDC MSMT LEADCHNL RA PACING THRESHOLD AMPLITUDE: 1 V
MDC IDC MSMT LEADCHNL RA PACING THRESHOLD PULSEWIDTH: 0.4 ms
MDC IDC MSMT LEADCHNL RA SENSING INTR AMPL: 4.3 mV
MDC IDC MSMT LEADCHNL RV IMPEDANCE VALUE: 390 Ohm
MDC IDC PG IMPLANT DT: 20130510
MDC IDC SET LEADCHNL RV SENSING SENSITIVITY: 2 mV
MDC IDC STAT BRADY RA PERCENT PACED: 58 %
Pulse Gen Model: 2210
Pulse Gen Serial Number: 7336473

## 2018-12-13 ENCOUNTER — Ambulatory Visit: Payer: Medicare Other | Admitting: Cardiovascular Disease

## 2018-12-13 ENCOUNTER — Encounter: Payer: Self-pay | Admitting: Cardiovascular Disease

## 2018-12-13 VITALS — BP 110/56 | HR 60 | Ht 70.0 in | Wt 169.6 lb

## 2018-12-13 DIAGNOSIS — E039 Hypothyroidism, unspecified: Secondary | ICD-10-CM

## 2018-12-13 DIAGNOSIS — I2510 Atherosclerotic heart disease of native coronary artery without angina pectoris: Secondary | ICD-10-CM | POA: Diagnosis not present

## 2018-12-13 DIAGNOSIS — I495 Sick sinus syndrome: Secondary | ICD-10-CM | POA: Diagnosis not present

## 2018-12-13 DIAGNOSIS — I10 Essential (primary) hypertension: Secondary | ICD-10-CM

## 2018-12-13 DIAGNOSIS — I441 Atrioventricular block, second degree: Secondary | ICD-10-CM

## 2018-12-13 DIAGNOSIS — Z950 Presence of cardiac pacemaker: Secondary | ICD-10-CM | POA: Diagnosis not present

## 2018-12-13 DIAGNOSIS — E7800 Pure hypercholesterolemia, unspecified: Secondary | ICD-10-CM

## 2018-12-13 DIAGNOSIS — G4731 Primary central sleep apnea: Secondary | ICD-10-CM

## 2018-12-13 DIAGNOSIS — R55 Syncope and collapse: Secondary | ICD-10-CM

## 2018-12-13 NOTE — Patient Instructions (Addendum)
Medication Instructions:  Continue same medications  If you need a refill on your cardiac medications before your next appointment, please call your pharmacy.   Lab work: None ordered   Testing/Procedures: None ordered  Follow-Up: At [facility], you and your health needs are our priority.  As part of our continuing mission to provide you with exceptional heart care, we have created designated Provider Care Teams.  These Care Teams include your primary Cardiologist (physician) and Advanced Practice Providers (APPs -  Physician Assistants and Nurse Practitioners) who all work together to provide you with the care you need, when you need it.  . Follow Up with Dr.Croitoru in 12 months  Call 3 months before to schedule

## 2018-12-13 NOTE — Progress Notes (Signed)
[name]., male   DOB: [birth year], 83 y.o.   MRN: [id number]     Cardiology Office Note   Date:  12/13/2018   ID:  Lonnie Marshall., DOB [birth year], MRN [id number]  PCP:  Lonnie Course, PA-C  Cardiologist:  Lonnie Majestic, MD;  Lonnie Klein, MD   No chief complaint on file. Pacemaker check    History of Present Illness: Lonnie Marshall. is a 83 y.o. male who presents for CAD, pacemaker follow up  Mr. Trompeter has history of both sinus node dysfunction and second-degree atrioventricular block Mobitz type I. He received a pacemaker in 2013. He has a history of coronary disease: ST segment elevation myocardial infarction around the time of pacemaker implantation. He had total proximal LAD occlusion treated with overlapping stents 2.75x24 2.5x38 DES Xience and Promus. 3 days later he underwent staged intervention to the obtuse marginal branch of the circumflex vessel. In September 2013 a nuclear study showed distal LAD scar but otherwise significant myocardial salvage and other territories. Post-rest ejection fraction was 64%. No angina since his acute MI in 2013.  The patient specifically denies any chest pain at rest exertion, dyspnea at rest or with exertion, orthopnea, paroxysmal nocturnal dyspnea, syncope, palpitations, focal neurological deficits, intermittent claudication, lower extremity edema, unexplained weight gain, hemoptysis.  He's recently developed more problems with cough productive of tenacious but clear sputum and wheezing.  He has had improved after treatment with nebulizer and inhalers.  He does not have orthopnea or PND or leg edema.  He has seen a new provider in the [facility] in [place] (Dr. Dayle Points) and had his labs checked there within the last 6 months.  He remembers being told that his cholesterol was "okay".  His hemoglobin A1c in March 2019 was 7.4%.  His creatinine was 1.5.  About a year ago he had an episode that sounded like  vasovagal syncope, may be triggered by overheating.  This has not happened since.  Pacemaker function is normal.  Battery longevity is estimated at 6.2-7.8 years.  Lead parameters show a slight increase in capture threshold on both leads, appropriate adjustments in outflow was made today.  Sensing and lead impedances remain normal.  He has 56% atrial pacing and only 8% ventricular pacing.  He has extremely rare and brief episodes of paroxysmal atrial tachycardia.  He has not had any atrial fibrillation.  He will be celebrating his 83st birthday next week.  Past Medical History:  Diagnosis Date  . Arthritis   . Benign localized hyperplasia of prostate with urinary obstruction 07/14/2013  . Complication of anesthesia    time before last- confused  . Coronary artery disease   . High cholesterol   . History of kidney stones   . Hypertension   . Kidney stones    "once"  . Overactive bladder   . Pacemaker   . S/P coronary artery stent placement, emergently to 100% occl. LAD with DES X 2.   02/28/2012  . Skull fracture ([place]) 1942   "fell out of car; in hospital for some time"  . Sleep apnea    "suppose to have been tested 03/15/12"- uses it when needs it  . STEMI (ST elevation myocardial infarction), [place]  02/27/2012  . Type II diabetes mellitus ([place])   . Ureteral obstruction 07/14/2013    Past Surgical History:  Procedure Laterality Date  . BACK SURGERY    . CARDIAC CATHETERIZATION  02/27/12   "1"  .  CIRCUMCISION  1980's  . CORONARY ANGIOPLASTY WITH STENT PLACEMENT  03/01/12   "+2=3 total"  . CYSTOSCOPY WITH RETROGRADE PYELOGRAM, URETEROSCOPY AND STENT PLACEMENT Left 07/13/2013   Procedure: CYSTOSCOPY WITH LEFT RETROGRADE PYELOGRAM, LEFT URETEROSCOPY AND left STENT PLACEMENT, cystogram;  Surgeon: Lonnie Bonine, MD;  Location: [facility];  Service: Urology;  Laterality: Left;  . CYSTOSCOPY/URETEROSCOPY/HOLMIUM LASER/STENT PLACEMENT Left 02/07/2018   Procedure: CYSTOSCOPY/LEFT  RETROGRADE/LEFT URETEROSCOPY/HOLMIUM LASER LEFT /STENT PLACEMENT;  Surgeon: Lonnie Aloe, MD;  Location: [facility];  Service: Urology;  Laterality: Left;  ONLY NEEDS 60 MIN  . INSERT / REPLACE / REMOVE PACEMAKER  03/17/12   "first one"  . LEFT HEART CATHETERIZATION WITH CORONARY ANGIOGRAM N/A 02/27/2012   Procedure: LEFT HEART CATHETERIZATION WITH CORONARY ANGIOGRAM;  Surgeon: Lonnie Sine, MD;  Location: [facility] CATH LAB;  Service: Cardiovascular;  Laterality: N/A;  . LUMBAR DISC SURGERY  ~ 1985  . PARTIAL COLECTOMY N/A 02/14/2014   Procedure: RIGHT COLECTOMY;  Surgeon: Lonnie Regal, MD;  Location: [facility];  Service: General;  Laterality: N/A;  . PERCUTANEOUS CORONARY STENT INTERVENTION (PCI-S) N/A 03/01/2012   Procedure: PERCUTANEOUS CORONARY STENT INTERVENTION (PCI-S);  Surgeon: Lonnie Man, MD;  Location: [facility] CATH LAB;  Service: Cardiovascular;  Laterality: N/A;  . PERMANENT PACEMAKER INSERTION N/A 03/17/2012   Procedure: PERMANENT PACEMAKER INSERTION;  Surgeon: Lonnie Klein, MD;  Location: [facility] CATH LAB;  Service: Cardiovascular;  Laterality: N/A;  . TRANSURETHRAL RESECTION OF PROSTATE N/A 07/13/2013   Procedure: TRANSURETHRAL RESECTION OF THE PROSTATE (TURP);  Surgeon: Lonnie Bonine, MD;  Location: [facility];  Service: Urology;  Laterality: N/A;  . VASECTOMY  ~83 years old   same time as circumcision     Current Outpatient Medications  Medication Sig Dispense Refill  . albuterol (ACCUNEB) 0.63 MG/3ML nebulizer solution Take 1 ampule by nebulization every 4 (four) hours.    Marland Kitchen albuterol (PROVENTIL HFA;VENTOLIN HFA) 108 (90 Base) MCG/ACT inhaler Inhale 2 puffs into the lungs every 6 (six) hours as needed for wheezing or shortness of breath.    Marland Kitchen aspirin EC 81 MG tablet Take 1 tablet (81 mg total) by mouth every morning.    Marland Kitchen atorvastatin (LIPITOR) 80 MG tablet Take 80 mg by mouth at bedtime.     . Budesonide-Formoterol Fumarate (SYMBICORT IN) Inhale 2 puffs into the lungs 2 (two) times  daily.    . cetirizine (ZYRTEC) 10 MG tablet Take 10 mg by mouth daily as needed for allergies.    . cholecalciferol (VITAMIN D) 1000 UNITS tablet Take 1,000 Units by mouth daily.    . clopidogrel (PLAVIX) 75 MG tablet Take 1 tablet (75 mg total) by mouth every morning.    . diphenhydrAMINE (BENADRYL) 25 MG tablet Take 25 mg by mouth every 6 (six) hours as needed for itching.    . finasteride (PROSCAR) 5 MG tablet Take 5 mg by mouth daily.    . isosorbide mononitrate (IMDUR) 30 MG 24 hr tablet Take 30 mg by mouth daily.    Marland Kitchen levothyroxine (SYNTHROID, LEVOTHROID) 25 MCG tablet Take 37.5 mcg by mouth daily before breakfast.   0  . lisinopril-hydrochlorothiazide (PRINZIDE,ZESTORETIC) 10-12.5 MG tablet Take 1 tablet by mouth daily.    . metFORMIN (GLUMETZA) 500 MG (MOD) 24 hr tablet Take 1,000 mg by mouth 2 (two) times daily with a meal.     . metoprolol (LOPRESSOR) 50 MG tablet Take 25 mg by mouth 2 (two) times daily.    . Omega-3 Fatty Acids (FISH  OIL) 1000 MG CAPS Take 1,000 mg by mouth daily.     . tamsulosin (FLOMAX) 0.4 MG CAPS capsule Take 0.4 mg by mouth daily.     No current facility-administered medications for this visit.     Allergies:   Other    Social History:  The patient  reports that he quit smoking about 43 years ago. His smoking use included cigarettes. He has a 60.00 pack-year smoking history. His smokeless tobacco use includes chew. He reports that he does not drink alcohol or use drugs.    ROS:  Please see the history of present illness.    Otherwise, review of systems positive for none.   All other systems are reviewed and are negative  PHYSICAL EXAM: VS:  BP (!) 110/56   Pulse 60   Ht 5\' 10"  (1.778 m)   Wt 169 lb 9.6 oz (76.9 kg)   BMI 24.34 kg/m  , BMI Body mass index is 24.34 kg/m.    General: Alert, oriented x3, no distress, appears well.  Healthy left subclavian pacemaker site Head: no evidence of trauma, PERRL, EOMI, no exophtalmos or lid lag, no  myxedema, no xanthelasma; normal ears, nose and oropharynx Neck: normal jugular venous pulsations and no hepatojugular reflux; brisk carotid pulses without delay, has a known left carotid bruit Chest: clear to auscultation, but with rather diminished breath sounds throughout, no signs of consolidation by percussion or palpation, normal fremitus, symmetrical and full respiratory excursions Cardiovascular: normal position and quality of the apical impulse, regular rhythm, normal first and second heart sounds, no murmurs, rubs or gallops Abdomen: no tenderness or distention, no masses by palpation, no abnormal pulsatility or arterial bruits, normal bowel sounds, no hepatosplenomegaly Extremities: no clubbing, cyanosis or edema; 2+ radial, ulnar and brachial pulses bilaterally; 2+ right femoral, posterior tibial and dorsalis pedis pulses; 2+ left femoral, posterior tibial and dorsalis pedis pulses; no subclavian or femoral bruits Neurological: grossly nonfocal Psych: Normal mood and affect   EKG:  EKG is ordered today.  It shows atrial paced ventricular sensed rhythm with prolonged AV delay around 270 ms, no repolarization, QTC 406 ms.  He has  Lipid Panel    Component Value Date/Time   CHOL 169 02/28/2012 0857   TRIG 104 02/28/2012 0857   HDL 41 02/28/2012 0857   CHOLHDL 4.1 02/28/2012 0857   VLDL 21 02/28/2012 0857   LDLCALC 107 (H) 02/28/2012 0857      Wt Readings from Last 3 Encounters:  12/13/18 169 lb 9.6 oz (76.9 kg)  02/07/18 167 lb (75.8 kg)  02/03/18 167 lb (75.8 kg)     1. SSS (sick sinus syndrome) ([place])   2. Second degree AV block   3. Pacemaker   4. Coronary artery disease involving native coronary artery of native heart without angina pectoris   5. Hypercholesterolemia   6. Acquired hypothyroidism   7. Syncope, vasovagal   8. Complex sleep apnea syndrome   9. Essential hypertension      ASSESSMENT AND PLAN:  1. SSS: Appropriate heart rate histogram distribution  for his activity level  2. Second degree AV block: The presence of ventricular pacing remains low at approximately 8%. 3. PPM: Small adjustments were necessary in the device outputs today.  Continue remote downloads every 3 months and yearly office visit. 4. CAD: Asymptomatic, on multiple antianginal medications 5. Hyperlipidemia: labs checked at the [place], your target is a copy. 6. Hypothyroidism: On thyroid supplementation.  No recent TSH available. 7. Syncope: Has  had only one episode suggestive of vasovagal syncope, that occurred roughly a year ago.None since. 8. Complex sleep apnea with both obstructive and central events on BiPAP.  He reports compliance with his device.  He denies daytime hypersomnolence. 9. HTN: Excellent control.   Current medicines are reviewed at length with the patient today.  The patient does not have concerns regarding medicines.  The following changes have been made:  Stop using over-the-counter medications that contain a decongestant.  Labs/ tests ordered today include:   No orders of the defined types were placed in this encounter.   Patient Instructions  Medication Instructions:  Continue same medications  If you need a refill on your cardiac medications before your next appointment, please call your pharmacy.   Lab work: None ordered   Testing/Procedures: None ordered  Follow-Up: At [facility], you and your health needs are our priority.  As part of our continuing mission to provide you with exceptional heart care, we have created designated Provider Care Teams.  These Care Teams include your primary Cardiologist (physician) and Advanced Practice Providers (APPs -  Physician Assistants and Nurse Practitioners) who all work together to provide you with the care you need, when you need it.  . Follow Up with Dr.Allanah Mcfarland in 12 months  Call 3 months before to schedule       Signed, Lonnie Klein, MD  12/13/2018 10:11 AM    Lonnie Klein,  MD, [facility] [phone] office [phone] pager

## 2018-12-19 LAB — CUP PACEART INCLINIC DEVICE CHECK
Date Time Interrogation Session: 20200211120510
Implantable Lead Implant Date: 20130510
Implantable Lead Location: 753860
MDC IDC LEAD IMPLANT DT: 20130510
MDC IDC LEAD LOCATION: 753859
MDC IDC PG IMPLANT DT: 20130510
MDC IDC PG SERIAL: 7336473
Pulse Gen Model: 2210

## 2019-01-03 ENCOUNTER — Ambulatory Visit (INDEPENDENT_AMBULATORY_CARE_PROVIDER_SITE_OTHER): Payer: Medicare Other | Admitting: *Deleted

## 2019-01-03 DIAGNOSIS — I495 Sick sinus syndrome: Secondary | ICD-10-CM

## 2019-01-05 LAB — CUP PACEART REMOTE DEVICE CHECK
Battery Voltage: 2.9 V
Brady Statistic AP VP Percent: 3.5 %
Brady Statistic AS VP Percent: 1 %
Brady Statistic RA Percent Paced: 65 %
Brady Statistic RV Percent Paced: 3.5 %
Date Time Interrogation Session: 20200226070009
Implantable Lead Implant Date: 20130510
Implantable Lead Location: 753860
Lead Channel Impedance Value: 390 Ohm
Lead Channel Pacing Threshold Amplitude: 1 V
Lead Channel Pacing Threshold Pulse Width: 0.5 ms
Lead Channel Sensing Intrinsic Amplitude: 12 mV
Lead Channel Setting Pacing Amplitude: 2 V
Lead Channel Setting Sensing Sensitivity: 2 mV
MDC IDC LEAD IMPLANT DT: 20130510
MDC IDC LEAD LOCATION: 753859
MDC IDC MSMT BATTERY REMAINING LONGEVITY: 73 mo
MDC IDC MSMT BATTERY REMAINING PERCENTAGE: 65 %
MDC IDC MSMT LEADCHNL RA IMPEDANCE VALUE: 430 Ohm
MDC IDC MSMT LEADCHNL RA SENSING INTR AMPL: 4.7 mV
MDC IDC MSMT LEADCHNL RV PACING THRESHOLD AMPLITUDE: 1.25 V
MDC IDC MSMT LEADCHNL RV PACING THRESHOLD PULSEWIDTH: 0.6 ms
MDC IDC PG IMPLANT DT: 20130510
MDC IDC SET LEADCHNL RV PACING AMPLITUDE: 2.5 V
MDC IDC SET LEADCHNL RV PACING PULSEWIDTH: 0.6 ms
MDC IDC STAT BRADY AP VS PERCENT: 62 %
MDC IDC STAT BRADY AS VS PERCENT: 35 %
Pulse Gen Model: 2210
Pulse Gen Serial Number: 7336473

## 2019-01-10 ENCOUNTER — Encounter: Payer: Self-pay | Admitting: Cardiology

## 2019-01-10 NOTE — Progress Notes (Signed)
Remote pacemaker transmission.   

## 2019-04-04 ENCOUNTER — Ambulatory Visit (INDEPENDENT_AMBULATORY_CARE_PROVIDER_SITE_OTHER): Payer: Medicare Other | Admitting: *Deleted

## 2019-04-04 DIAGNOSIS — I495 Sick sinus syndrome: Secondary | ICD-10-CM | POA: Diagnosis not present

## 2019-04-04 LAB — CUP PACEART REMOTE DEVICE CHECK
Date Time Interrogation Session: 20200527122906
Implantable Lead Implant Date: 20130510
Implantable Lead Implant Date: 20130510
Implantable Lead Location: 753859
Implantable Lead Location: 753860
Implantable Pulse Generator Implant Date: 20130510
Pulse Gen Model: 2210
Pulse Gen Serial Number: 7336473

## 2019-04-13 ENCOUNTER — Encounter: Payer: Self-pay | Admitting: Cardiology

## 2019-04-13 NOTE — Progress Notes (Signed)
Remote pacemaker transmission.   

## 2019-07-04 ENCOUNTER — Ambulatory Visit (INDEPENDENT_AMBULATORY_CARE_PROVIDER_SITE_OTHER): Payer: Medicare Other | Admitting: *Deleted

## 2019-07-04 DIAGNOSIS — I495 Sick sinus syndrome: Secondary | ICD-10-CM

## 2019-07-04 LAB — CUP PACEART REMOTE DEVICE CHECK
Battery Remaining Longevity: 73 mo
Battery Remaining Percentage: 65 %
Battery Voltage: 2.9 V
Brady Statistic AP VP Percent: 11 %
Brady Statistic AP VS Percent: 53 %
Brady Statistic AS VP Percent: 1 %
Brady Statistic AS VS Percent: 36 %
Brady Statistic RA Percent Paced: 63 %
Brady Statistic RV Percent Paced: 11 %
Date Time Interrogation Session: 20200826074040
Implantable Lead Implant Date: 20130510
Implantable Lead Implant Date: 20130510
Implantable Lead Location: 753859
Implantable Lead Location: 753860
Implantable Pulse Generator Implant Date: 20130510
Lead Channel Impedance Value: 430 Ohm
Lead Channel Impedance Value: 450 Ohm
Lead Channel Pacing Threshold Amplitude: 1 V
Lead Channel Pacing Threshold Amplitude: 1.25 V
Lead Channel Pacing Threshold Pulse Width: 0.5 ms
Lead Channel Pacing Threshold Pulse Width: 0.6 ms
Lead Channel Sensing Intrinsic Amplitude: 12 mV
Lead Channel Sensing Intrinsic Amplitude: 4.9 mV
Lead Channel Setting Pacing Amplitude: 2 V
Lead Channel Setting Pacing Amplitude: 2.5 V
Lead Channel Setting Pacing Pulse Width: 0.6 ms
Lead Channel Setting Sensing Sensitivity: 2 mV
Pulse Gen Model: 2210
Pulse Gen Serial Number: 7336473

## 2019-07-13 ENCOUNTER — Encounter: Payer: Self-pay | Admitting: Cardiology

## 2019-07-13 NOTE — Progress Notes (Signed)
Remote pacemaker transmission.   

## 2019-10-03 ENCOUNTER — Ambulatory Visit (INDEPENDENT_AMBULATORY_CARE_PROVIDER_SITE_OTHER): Payer: Medicare Other | Admitting: *Deleted

## 2019-10-03 DIAGNOSIS — R001 Bradycardia, unspecified: Secondary | ICD-10-CM

## 2019-10-03 LAB — CUP PACEART REMOTE DEVICE CHECK
Battery Remaining Longevity: 64 mo
Battery Remaining Percentage: 57 %
Battery Voltage: 2.89 V
Brady Statistic AP VP Percent: 11 %
Brady Statistic AP VS Percent: 57 %
Brady Statistic AS VP Percent: 1 %
Brady Statistic AS VS Percent: 32 %
Brady Statistic RA Percent Paced: 67 %
Brady Statistic RV Percent Paced: 11 %
Date Time Interrogation Session: 20201125031447
Implantable Lead Implant Date: 20130510
Implantable Lead Implant Date: 20130510
Implantable Lead Location: 753859
Implantable Lead Location: 753860
Implantable Pulse Generator Implant Date: 20130510
Lead Channel Impedance Value: 430 Ohm
Lead Channel Impedance Value: 430 Ohm
Lead Channel Pacing Threshold Amplitude: 1 V
Lead Channel Pacing Threshold Amplitude: 1.25 V
Lead Channel Pacing Threshold Pulse Width: 0.5 ms
Lead Channel Pacing Threshold Pulse Width: 0.6 ms
Lead Channel Sensing Intrinsic Amplitude: 12 mV
Lead Channel Sensing Intrinsic Amplitude: 5 mV
Lead Channel Setting Pacing Amplitude: 2 V
Lead Channel Setting Pacing Amplitude: 2.5 V
Lead Channel Setting Pacing Pulse Width: 0.6 ms
Lead Channel Setting Sensing Sensitivity: 2 mV
Pulse Gen Model: 2210
Pulse Gen Serial Number: 7336473

## 2019-10-30 NOTE — Progress Notes (Signed)
PPM remote 

## 2019-12-21 ENCOUNTER — Ambulatory Visit: Payer: Medicare Other | Admitting: Cardiovascular Disease

## 2020-01-02 ENCOUNTER — Ambulatory Visit (INDEPENDENT_AMBULATORY_CARE_PROVIDER_SITE_OTHER): Payer: Medicare Other | Admitting: *Deleted

## 2020-01-02 DIAGNOSIS — R001 Bradycardia, unspecified: Secondary | ICD-10-CM

## 2020-01-02 LAB — CUP PACEART REMOTE DEVICE CHECK
Battery Remaining Longevity: 64 mo
Battery Remaining Percentage: 57 %
Battery Voltage: 2.89 V
Brady Statistic AP VP Percent: 9.9 %
Brady Statistic AP VS Percent: 57 %
Brady Statistic AS VP Percent: 1 %
Brady Statistic AS VS Percent: 33 %
Brady Statistic RA Percent Paced: 66 %
Brady Statistic RV Percent Paced: 10 %
Date Time Interrogation Session: 20210224034250
Implantable Lead Implant Date: 20130510
Implantable Lead Implant Date: 20130510
Implantable Lead Location: 753859
Implantable Lead Location: 753860
Implantable Pulse Generator Implant Date: 20130510
Lead Channel Impedance Value: 430 Ohm
Lead Channel Impedance Value: 430 Ohm
Lead Channel Pacing Threshold Amplitude: 1 V
Lead Channel Pacing Threshold Amplitude: 1.25 V
Lead Channel Pacing Threshold Pulse Width: 0.5 ms
Lead Channel Pacing Threshold Pulse Width: 0.6 ms
Lead Channel Sensing Intrinsic Amplitude: 12 mV
Lead Channel Sensing Intrinsic Amplitude: 4.9 mV
Lead Channel Setting Pacing Amplitude: 2 V
Lead Channel Setting Pacing Amplitude: 2.5 V
Lead Channel Setting Pacing Pulse Width: 0.6 ms
Lead Channel Setting Sensing Sensitivity: 2 mV
Pulse Gen Model: 2210
Pulse Gen Serial Number: 7336473

## 2020-01-03 NOTE — Progress Notes (Signed)
PPM Remote  

## 2020-02-25 ENCOUNTER — Other Ambulatory Visit: Payer: Self-pay

## 2020-02-25 ENCOUNTER — Telehealth: Payer: Self-pay | Admitting: Cardiovascular Disease

## 2020-02-25 ENCOUNTER — Ambulatory Visit (INDEPENDENT_AMBULATORY_CARE_PROVIDER_SITE_OTHER): Payer: Medicare Other | Admitting: Cardiovascular Disease

## 2020-02-25 ENCOUNTER — Encounter: Payer: Self-pay | Admitting: Cardiovascular Disease

## 2020-02-25 VITALS — BP 142/78 | HR 62 | Ht 70.0 in | Wt 171.0 lb

## 2020-02-25 DIAGNOSIS — I441 Atrioventricular block, second degree: Secondary | ICD-10-CM | POA: Diagnosis not present

## 2020-02-25 DIAGNOSIS — I495 Sick sinus syndrome: Secondary | ICD-10-CM

## 2020-02-25 DIAGNOSIS — Z950 Presence of cardiac pacemaker: Secondary | ICD-10-CM | POA: Diagnosis not present

## 2020-02-25 DIAGNOSIS — I10 Essential (primary) hypertension: Secondary | ICD-10-CM

## 2020-02-25 DIAGNOSIS — E7800 Pure hypercholesterolemia, unspecified: Secondary | ICD-10-CM

## 2020-02-25 DIAGNOSIS — G4739 Other sleep apnea: Secondary | ICD-10-CM

## 2020-02-25 DIAGNOSIS — R55 Syncope and collapse: Secondary | ICD-10-CM

## 2020-02-25 DIAGNOSIS — E039 Hypothyroidism, unspecified: Secondary | ICD-10-CM

## 2020-02-25 DIAGNOSIS — I2510 Atherosclerotic heart disease of native coronary artery without angina pectoris: Secondary | ICD-10-CM

## 2020-02-25 DIAGNOSIS — G4731 Primary central sleep apnea: Secondary | ICD-10-CM

## 2020-02-25 NOTE — Telephone Encounter (Signed)
Epic has been updated.

## 2020-02-25 NOTE — Progress Notes (Signed)
[name]., male   DOB: 1936-06-27, 84 y.o.   MRN: [id number]     Cardiology Office Note   Date:  02/27/2020   ID:  Lonnie Marshall., DOB Feb 13, 1936, MRN [id number]  PCP:  Carolee Rota, NP  Cardiologist:  Shelva Majestic, MD;  Sanda Klein, MD   Chief Complaint  Patient presents with  . Pacemaker Check      History of Present Illness: Lonnie Marshall. is a 84 y.o. male who presents for CAD, pacemaker follow up  Mr. Hartung has history of both sinus node dysfunction and second-degree atrioventricular block Mobitz type I. He received a pacemaker in 2013. He has a history of coronary disease: ST segment elevation myocardial infarction around the time of pacemaker implantation. He had total proximal LAD occlusion treated with overlapping stents 2.75x24 2.5x38 DES Xience and Promus. 3 days later he underwent staged intervention to the obtuse marginal branch of the circumflex vessel. In September 2013 a nuclear study showed distal LAD scar but otherwise significant myocardial salvage and other territories. Post-rest ejection fraction was 64%. No angina since his acute MI in 2013.  The patient specifically denies any chest pain at rest exertion, dyspnea at rest or with exertion, orthopnea, paroxysmal nocturnal dyspnea, syncope, palpitations, focal neurological deficits, intermittent claudication, lower extremity edema, unexplained weight gain, cough, hemoptysis.  He has occasional wheezing well-controlled with bronchodilators.  His biggest complaint is of unsteady gait ("staggers"), but he has not had any falls.  He denies any bleeding problems.  He is on chronic dual antiplatelet therapy with aspirin and clopidogrel.  He had a vasovagal syncopal event about 3 years ago, without recurrence.  Pacemaker function is normal.  Battery longevity is estimated at 4.7-6.0 years.  Presenting rhythm is atrial paced, ventricular sensed.  Lead parameters show stable findings from his last  visit.    He has 66% atrial pacing and only 9% ventricular pacing.  Not had atrial fibrillation or high ventricular rates since his last device check.    Past Medical History:  Diagnosis Date  . Arthritis   . Benign localized hyperplasia of prostate with urinary obstruction 07/14/2013  . Complication of anesthesia    time before last- confused  . Coronary artery disease   . High cholesterol   . History of kidney stones   . Hypertension   . Kidney stones    "once"  . Overactive bladder   . Pacemaker   . S/P coronary artery stent placement, emergently to 100% occl. LAD with DES X 2.   02/28/2012  . Skull fracture ([place]) 1942   "fell out of car; in hospital for some time"  . Sleep apnea    "suppose to have been tested 03/15/12"- uses it when needs it  . STEMI (ST elevation myocardial infarction), [place]  02/27/2012  . Type II diabetes mellitus ([place])   . Ureteral obstruction 07/14/2013    Past Surgical History:  Procedure Laterality Date  . BACK SURGERY    . CARDIAC CATHETERIZATION  02/27/12   "1"  . CIRCUMCISION  1980's  . CORONARY ANGIOPLASTY WITH STENT PLACEMENT  03/01/12   "+2=3 total"  . CYSTOSCOPY WITH RETROGRADE PYELOGRAM, URETEROSCOPY AND STENT PLACEMENT Left 07/13/2013   Procedure: CYSTOSCOPY WITH LEFT RETROGRADE PYELOGRAM, LEFT URETEROSCOPY AND left STENT PLACEMENT, cystogram;  Surgeon: Fredricka Bonine, MD;  Location: [facility];  Service: Urology;  Laterality: Left;  . CYSTOSCOPY/URETEROSCOPY/HOLMIUM LASER/STENT PLACEMENT Left 02/07/2018   Procedure: CYSTOSCOPY/LEFT RETROGRADE/LEFT URETEROSCOPY/HOLMIUM  LASER LEFT /STENT PLACEMENT;  Surgeon: [name], MD;  Location: [facility];  Service: Urology;  Laterality: Left;  ONLY NEEDS 60 MIN  . INSERT / REPLACE / REMOVE PACEMAKER  03/17/12   "first one"  . LEFT HEART CATHETERIZATION WITH CORONARY ANGIOGRAM N/A 02/27/2012   Procedure: LEFT HEART CATHETERIZATION WITH CORONARY ANGIOGRAM;  Surgeon: Troy Sine, MD;  Location: [facility]  CATH LAB;  Service: Cardiovascular;  Laterality: N/A;  . LUMBAR DISC SURGERY  ~ 1985  . PARTIAL COLECTOMY N/A 02/14/2014   Procedure: RIGHT COLECTOMY;  Surgeon: Earnstine Regal, MD;  Location: [facility];  Service: General;  Laterality: N/A;  . PERCUTANEOUS CORONARY STENT INTERVENTION (PCI-S) N/A 03/01/2012   Procedure: PERCUTANEOUS CORONARY STENT INTERVENTION (PCI-S);  Surgeon: Leonie Man, MD;  Location: [facility] CATH LAB;  Service: Cardiovascular;  Laterality: N/A;  . PERMANENT PACEMAKER INSERTION N/A 03/17/2012   Procedure: PERMANENT PACEMAKER INSERTION;  Surgeon: Sanda Klein, MD;  Location: [facility] CATH LAB;  Service: Cardiovascular;  Laterality: N/A;  . TRANSURETHRAL RESECTION OF PROSTATE N/A 07/13/2013   Procedure: TRANSURETHRAL RESECTION OF THE PROSTATE (TURP);  Surgeon: Fredricka Bonine, MD;  Location: [facility];  Service: Urology;  Laterality: N/A;  . VASECTOMY  ~84 years old   same time as circumcision     Current Outpatient Medications  Medication Sig Dispense Refill  . albuterol (ACCUNEB) 0.63 MG/3ML nebulizer solution Take 1 ampule by nebulization every 4 (four) hours.    Marland Kitchen albuterol (PROVENTIL HFA;VENTOLIN HFA) 108 (90 Base) MCG/ACT inhaler Inhale 2 puffs into the lungs every 6 (six) hours as needed for wheezing or shortness of breath.    Marland Kitchen aspirin EC 81 MG tablet Take 1 tablet (81 mg total) by mouth every morning.    Marland Kitchen atorvastatin (LIPITOR) 80 MG tablet Take 80 mg by mouth at bedtime.     . Budesonide-Formoterol Fumarate (SYMBICORT IN) Inhale 2 puffs into the lungs 2 (two) times daily.    . cetirizine (ZYRTEC) 10 MG tablet Take 10 mg by mouth daily as needed for allergies.    . cholecalciferol (VITAMIN D) 1000 UNITS tablet Take 1,000 Units by mouth daily.    . clopidogrel (PLAVIX) 75 MG tablet Take 1 tablet (75 mg total) by mouth every morning.    . diphenhydrAMINE (BENADRYL) 25 MG tablet Take 25 mg by mouth every 6 (six) hours as needed for itching.    . finasteride (PROSCAR) 5 MG  tablet Take 5 mg by mouth daily.    . isosorbide mononitrate (IMDUR) 30 MG 24 hr tablet Take 30 mg by mouth daily.    Marland Kitchen levothyroxine (SYNTHROID, LEVOTHROID) 25 MCG tablet Take 37.5 mcg by mouth daily before breakfast.   0  . lisinopril-hydrochlorothiazide (PRINZIDE,ZESTORETIC) 10-12.5 MG tablet Take 1 tablet by mouth daily.    . metFORMIN (GLUMETZA) 500 MG (MOD) 24 hr tablet Take 1,000 mg by mouth 2 (two) times daily with a meal.     . metoprolol (LOPRESSOR) 50 MG tablet Take 25 mg by mouth 2 (two) times daily.    . Omega-3 Fatty Acids (FISH OIL) 1000 MG CAPS Take 1,000 mg by mouth daily.     . tamsulosin (FLOMAX) 0.4 MG CAPS capsule Take 0.4 mg by mouth daily.     No current facility-administered medications for this visit.    Allergies:   Other    Social History:  The patient  reports that he quit smoking about 45 years ago. His smoking use included cigarettes. He has a 60.00  pack-year smoking history. His smokeless tobacco use includes chew. He reports that he does not drink alcohol or use drugs.    ROS:  Please see the history of present illness.    Otherwise, review of systems positive for none.   All other systems are reviewed and are negative.  PHYSICAL EXAM: VS:  BP (!) 142/78   Pulse 62   Ht 5\' 10"  (1.778 m)   Wt 171 lb (77.6 kg)   SpO2 96%   BMI 24.54 kg/m  , BMI Body mass index is 24.54 kg/m.   General: Alert, oriented x3, no distress, healthy left subclavian pacemaker site Head: no evidence of trauma, PERRL, EOMI, no exophtalmos or lid lag, no myxedema, no xanthelasma; normal ears, nose and oropharynx Neck: normal jugular venous pulsations and no hepatojugular reflux; brisk carotid pulses without delay and no carotid bruits Chest: Breath sounds are little distant, but otherwise clear to auscultation, no signs of consolidation by percussion or palpation, normal fremitus, symmetrical and full respiratory excursions Cardiovascular: normal position and quality of the  apical impulse, regular rhythm, normal first and second heart sounds, no murmurs, rubs or gallops Abdomen: no tenderness or distention, no masses by palpation, no abnormal pulsatility or arterial bruits, normal bowel sounds, no hepatosplenomegaly Extremities: no clubbing, cyanosis or edema; 2+ radial, ulnar and brachial pulses bilaterally; 2+ right femoral, posterior tibial and dorsalis pedis pulses; 2+ left femoral, posterior tibial and dorsalis pedis pulses; no subclavian or femoral bruits Neurological: grossly nonfocal Hard of hearing Psych: Normal mood and affect    EKG:  EKG is ordered today.  It shows atrial paced, ventricular sensed rhythm but is otherwise normal.  QTc 400 ms  Lipid Panel    Component Value Date/Time   CHOL 169 02/28/2012 0857   TRIG 104 02/28/2012 0857   HDL 41 02/28/2012 0857   CHOLHDL 4.1 02/28/2012 0857   VLDL 21 02/28/2012 0857   LDLCALC 107 (H) 02/28/2012 0857      Wt Readings from Last 3 Encounters:  02/25/20 171 lb (77.6 kg)  12/13/18 169 lb 9.6 oz (76.9 kg)  02/07/18 167 lb (75.8 kg)     1. SSS (sick sinus syndrome) ([place])   2. Second degree AV block   3. Pacemaker   4. Coronary artery disease involving native coronary artery of native heart without angina pectoris   5. Hypercholesterolemia   6. Acquired hypothyroidism   7. Syncope, vasovagal   8. Complex sleep apnea syndrome   9. Essential hypertension      ASSESSMENT AND PLAN:  1. SSS: He does not have symptoms of chronotropic incompetence and the heart rate histogram distribution is appropriate for his activity level. 2. Second degree AV block: Despite this he has very infrequent need for ventricular pacing. 3. PPM: Not device dependent.  Continue remote downloads every 3 months and yearly office visit. 4. CAD: Denies angina pectoris, on 2 antianginal medications.  On statin and dual antiplatelet therapy. 5. Hyperlipidemia: He tells me he is due to have labs checked next week at the [place].   Target LDL less than 70. 6. Hypothyroidism: On levothyroxine supplementation. 7. Syncope: This happened 2 or 3 years ago, symptoms suggestive of vasovagal etiology, no recurrence 8. Complex sleep apnea with both obstructive and central events, treated with BiPAP and follows up with Dr. Claiborne Billings.  Reports good compliance with the device and no daytime hypersomnolence. 9. HTN: Adequate control.  Blood pressure at home is usually lower than recorded today.   Current  medicines are reviewed at length with the patient today.  The patient does not have concerns regarding medicines.  The following changes have been made:  Stop using over-the-counter medications that contain a decongestant.  Labs/ tests ordered today include:   Orders Placed This Encounter  Procedures  . EKG 12-Lead    Patient Instructions  Medication Instructions:  No changes *If you need a refill on your cardiac medications before your next appointment, please call your pharmacy*   Lab Work: None ordered If you have labs (blood work) drawn today and your tests are completely normal, you will receive your results only by: Marland Kitchen MyChart Message (if you have MyChart) OR . A paper copy in the mail If you have any lab test that is abnormal or we need to change your treatment, we will call you to review the results.   Testing/Procedures: None ordered   Follow-Up: At [facility], you and your health needs are our priority.  As part of our continuing mission to provide you with exceptional heart care, we have created designated Provider Care Teams.  These Care Teams include your primary Cardiologist (physician) and Advanced Practice Providers (APPs -  Physician Assistants and Nurse Practitioners) who all work together to provide you with the care you need, when you need it.  We recommend signing up for the patient portal called "MyChart".  Sign up information is provided on this After Visit Summary.  MyChart is used to connect  with patients for Virtual Visits (Telemedicine).  Patients are able to view lab/test results, encounter notes, upcoming appointments, etc.  Non-urgent messages can be sent to your provider as well.   To learn more about what you can do with MyChart, go to [email or web address].    Your next appointment:   12 month(s)  The format for your next appointment:   In Person  Provider:   Sanda Klein, MD       Signed, Sanda Klein, MD  02/27/2020 9:51 AM    Sanda Klein, MD, [facility] [phone] office [phone] pager

## 2020-02-25 NOTE — Telephone Encounter (Signed)
   Pt's wife called and she provided Pt's PCP Synthia Innocent at [place] at [facility] [place]

## 2020-02-25 NOTE — Patient Instructions (Signed)

## 2020-03-17 ENCOUNTER — Other Ambulatory Visit: Payer: Self-pay | Admitting: Cardiovascular Disease

## 2020-04-02 ENCOUNTER — Ambulatory Visit (INDEPENDENT_AMBULATORY_CARE_PROVIDER_SITE_OTHER): Payer: Medicare Other | Admitting: *Deleted

## 2020-04-02 DIAGNOSIS — I495 Sick sinus syndrome: Secondary | ICD-10-CM | POA: Diagnosis not present

## 2020-04-02 LAB — CUP PACEART REMOTE DEVICE CHECK
Battery Remaining Longevity: 56 mo
Battery Remaining Percentage: 51 %
Battery Voltage: 2.87 V
Brady Statistic AP VP Percent: 9.2 %
Brady Statistic AP VS Percent: 58 %
Brady Statistic AS VP Percent: 1 %
Brady Statistic AS VS Percent: 33 %
Brady Statistic RA Percent Paced: 66 %
Brady Statistic RV Percent Paced: 9.2 %
Date Time Interrogation Session: 20210526031250
Implantable Lead Implant Date: 20130510
Implantable Lead Implant Date: 20130510
Implantable Lead Location: 753859
Implantable Lead Location: 753860
Implantable Pulse Generator Implant Date: 20130510
Lead Channel Impedance Value: 430 Ohm
Lead Channel Impedance Value: 490 Ohm
Lead Channel Pacing Threshold Amplitude: 1 V
Lead Channel Pacing Threshold Amplitude: 1.25 V
Lead Channel Pacing Threshold Pulse Width: 0.5 ms
Lead Channel Pacing Threshold Pulse Width: 0.6 ms
Lead Channel Sensing Intrinsic Amplitude: 12 mV
Lead Channel Sensing Intrinsic Amplitude: 4.8 mV
Lead Channel Setting Pacing Amplitude: 2.5 V
Lead Channel Setting Pacing Amplitude: 2.5 V
Lead Channel Setting Pacing Pulse Width: 0.6 ms
Lead Channel Setting Sensing Sensitivity: 2 mV
Pulse Gen Model: 2210
Pulse Gen Serial Number: 7336473

## 2020-04-03 NOTE — Progress Notes (Signed)
Remote pacemaker transmission.   

## 2020-07-02 ENCOUNTER — Ambulatory Visit (INDEPENDENT_AMBULATORY_CARE_PROVIDER_SITE_OTHER): Payer: Medicare Other | Admitting: *Deleted

## 2020-07-02 DIAGNOSIS — I441 Atrioventricular block, second degree: Secondary | ICD-10-CM | POA: Diagnosis not present

## 2020-07-04 LAB — CUP PACEART REMOTE DEVICE CHECK
Battery Remaining Longevity: 49 mo
Battery Remaining Percentage: 46 %
Battery Voltage: 2.86 V
Brady Statistic AP VP Percent: 9.2 %
Brady Statistic AP VS Percent: 58 %
Brady Statistic AS VP Percent: 1 %
Brady Statistic AS VS Percent: 33 %
Brady Statistic RA Percent Paced: 65 %
Brady Statistic RV Percent Paced: 9.2 %
Date Time Interrogation Session: 20210825034616
Implantable Lead Implant Date: 20130510
Implantable Lead Implant Date: 20130510
Implantable Lead Location: 753859
Implantable Lead Location: 753860
Implantable Pulse Generator Implant Date: 20130510
Lead Channel Impedance Value: 430 Ohm
Lead Channel Impedance Value: 430 Ohm
Lead Channel Pacing Threshold Amplitude: 1 V
Lead Channel Pacing Threshold Amplitude: 1.25 V
Lead Channel Pacing Threshold Pulse Width: 0.5 ms
Lead Channel Pacing Threshold Pulse Width: 0.6 ms
Lead Channel Sensing Intrinsic Amplitude: 12 mV
Lead Channel Sensing Intrinsic Amplitude: 4.7 mV
Lead Channel Setting Pacing Amplitude: 2.5 V
Lead Channel Setting Pacing Amplitude: 2.5 V
Lead Channel Setting Pacing Pulse Width: 0.6 ms
Lead Channel Setting Sensing Sensitivity: 2 mV
Pulse Gen Model: 2210
Pulse Gen Serial Number: 7336473

## 2020-07-08 NOTE — Progress Notes (Signed)
Remote pacemaker transmission.   

## 2020-07-15 ENCOUNTER — Ambulatory Visit: Payer: Medicare Other | Admitting: Family Medicine

## 2020-07-28 ENCOUNTER — Ambulatory Visit: Payer: Medicare Other | Admitting: Family Medicine

## 2020-08-05 ENCOUNTER — Encounter: Payer: Self-pay | Admitting: Family Medicine

## 2020-08-05 ENCOUNTER — Other Ambulatory Visit: Payer: Self-pay

## 2020-08-05 ENCOUNTER — Ambulatory Visit (INDEPENDENT_AMBULATORY_CARE_PROVIDER_SITE_OTHER): Payer: Medicare Other | Admitting: Family Medicine

## 2020-08-05 VITALS — BP 160/82 | HR 60 | Temp 98.1°F | Resp 18 | Ht 70.0 in | Wt 170.1 lb

## 2020-08-05 DIAGNOSIS — Z0000 Encounter for general adult medical examination without abnormal findings: Secondary | ICD-10-CM

## 2020-08-05 NOTE — Progress Notes (Signed)
Office Note 08/05/2020  CC:  Chief Complaint  Patient presents with  . Establish Care    HPI:  Lonnie Marshall. is a 84 y.o. White male who is here to establish care Patient's most recent primary MD: Derenda Fennel in [facility]. Old records in EPIC/HL EMR were reviewed prior to or during today's visit.  [facility] and most recent was yesterday and got labs at that time. Nothing new to report from his eval per pt.  No records available at this time.  Has COPD, uses albuterol anywhere from 1 to 3 times a day--nebulizer mostly.  Uses symbicort qd usually.  Says he feels like he is doing well on this regimen.  DM: no home gluc monitoring but has glucometer. Says well controlled historically.  Has no signif burning or numbness in feet, no hx of ulcers.  Gets eye exams at [facility] annually.  Denies hx of diab retpthy.  Tries to eat diabetic diet and low Na diet. HTN: home bp monitoring "usually around normal" , around 130/80 best he can recall.   HLD: compliant with atorva, no side effects.  Activity level: riding lawnmower for home and graveyard. Piddles around the house.   Walks with cane when he does anything more than "short trips". No falls.    Past Medical History:  Diagnosis Date  . Allergic rhinitis   . Arthritis   . BPH with obstruction/lower urinary tract symptoms    hx of TURP  . COPD (chronic obstructive pulmonary disease) ([place])    onset of sx's around 2013  . Coronary artery disease    MI+stents 2013  . Hearing loss, bilateral    hearing aid--it bothers him to have aid in R ear.  Marland Kitchen History of kidney stones    hx ureterolithiasis requiring procedure  . History of lumbar surgery   . Hypercholesterolemia   . Hypertension   . Hypothyroidism   . Mobitz type 2 second degree atrioventricular block    has pacemaker  . Overactive bladder   . Pacemaker   . Skull fracture ([place]) 1942   "fell out of car; in hospital for some time"   . Sleep apnea    Central + obstructive->bipap (Dr. Claiborne Billings)  . SSS (sick sinus syndrome) ([place])    has pacemaker--followed by Dr. Orene Desanctis  . STEMI (ST elevation myocardial infarction), [place]  02/27/2012  . Type II diabetes mellitus ([place])     Past Surgical History:  Procedure Laterality Date  . APPENDECTOMY     as part of R colectomy surgery for tubulovillous adenoma  . CARDIAC CATHETERIZATION  02/27/12   "1"  . CIRCUMCISION  1980's  . CORONARY ANGIOPLASTY WITH STENT PLACEMENT  03/01/12   "+2=3 total"  . CYSTOSCOPY WITH RETROGRADE PYELOGRAM, URETEROSCOPY AND STENT PLACEMENT Left 07/13/2013   Procedure: CYSTOSCOPY WITH LEFT RETROGRADE PYELOGRAM, LEFT URETEROSCOPY AND left STENT PLACEMENT, cystogram;  Surgeon: Fredricka Bonine, MD;  Location: [facility];  Service: Urology;  Laterality: Left;  . CYSTOSCOPY/URETEROSCOPY/HOLMIUM LASER/STENT PLACEMENT Left 02/07/2018   Procedure: CYSTOSCOPY/LEFT RETROGRADE/LEFT URETEROSCOPY/HOLMIUM LASER LEFT /STENT PLACEMENT;  Surgeon: [name], MD;  Location: [facility];  Service: Urology;  Laterality: Left;  ONLY NEEDS 60 MIN  . INSERT / REPLACE / REMOVE PACEMAKER  03/17/12   "first one"  . LEFT HEART CATHETERIZATION WITH CORONARY ANGIOGRAM N/A 02/27/2012   Procedure: LEFT HEART CATHETERIZATION WITH CORONARY ANGIOGRAM;  Surgeon: Troy Sine, MD;  Location: [facility]  CATH LAB;  Service: Cardiovascular;  Laterality: N/A;  . LUMBAR DISC SURGERY  ~ 1985  . PARTIAL COLECTOMY N/A 02/14/2014   for large tubulovillous adenoma in cecum. Path showed high grade dysplasia but no evidence of carcinoma, neg nodes. Procedure: RIGHT COLECTOMY;  Surgeon: Earnstine Regal, MD;  Location: [facility];  Service: General;  Laterality: N/A;  . PERCUTANEOUS CORONARY STENT INTERVENTION (PCI-S) N/A 03/01/2012   Procedure: PERCUTANEOUS CORONARY STENT INTERVENTION (PCI-S);  Surgeon: Leonie Man, MD;  Location: [facility] CATH LAB;  Service: Cardiovascular;  Laterality: N/A;  . PERMANENT PACEMAKER  INSERTION N/A 03/17/2012   Procedure: PERMANENT PACEMAKER INSERTION;  Surgeon: Sanda Klein, MD;  Location: [facility] CATH LAB;  Service: Cardiovascular;  Laterality: N/A;  . TRANSURETHRAL RESECTION OF PROSTATE N/A 07/13/2013   Procedure: TRANSURETHRAL RESECTION OF THE PROSTATE (TURP);  Surgeon: Fredricka Bonine, MD;  Location: [facility];  Service: Urology;  Laterality: N/A;  . VASECTOMY  ~84 years old   same time as circumcision    Family History  Problem Relation Age of Onset  . Stroke Mother   . Heart attack Father   . Lung disease Sister   . Cancer Maternal Grandfather   . Cancer Paternal Grandfather   . Lung disease Brother   . High blood pressure Daughter   . High blood pressure Daughter   . Crohn's disease Daughter     Social History   Socioeconomic History  . Marital status: Married    Spouse name: Not on file  . Number of children: Not on file  . Years of education: Not on file  . Highest education level: Not on file  Occupational History  . Not on file  Tobacco Use  . Smoking status: Former Smoker    Packs/day: 3.00    Years: 20.00    Pack years: 60.00    Types: Cigarettes    Quit date: 03/03/1975    Years since quitting: 45.4  . Smokeless tobacco: Current User    Types: Chew  Vaping Use  . Vaping Use: Never used  Substance and Sexual Activity  . Alcohol use: No  . Drug use: No  . Sexual activity: Yes    Partners: Female    Comment: married  Other Topics Concern  . Not on file  Social History Narrative   Married, 2 daughters, 3 grandchildren.   Orig from [place].   Occup: retired Clinical biochemist.   Goes to [place] in [place].   Was in the [employer].     Was in [place] during [place] missile crisis.  No combat.   Smoked 35 pack-yr hx, quit in 1970s.   Social Determinants of Health   Financial Resource Strain:   . Difficulty of Paying Living Expenses: Not on file  Food Insecurity:   . Worried About Charity fundraiser in the Last Year: Not on file  . Ran  Out of Food in the Last Year: Not on file  Transportation Needs:   . Lack of Transportation (Medical): Not on file  . Lack of Transportation (Non-Medical): Not on file  Physical Activity:   . Days of Exercise per Week: Not on file  . Minutes of Exercise per Session: Not on file  Stress:   . Feeling of Stress : Not on file  Social Connections:   . Frequency of Communication with Friends and Family: Not on file  . Frequency of Social Gatherings with Friends and Family: Not on file  . Attends Religious Services: Not on file  .  Active Member of Clubs or Organizations: Not on file  . Attends Archivist Meetings: Not on file  . Marital Status: Not on file  Intimate Partner Violence:   . Fear of Current or Ex-Partner: Not on file  . Emotionally Abused: Not on file  . Physically Abused: Not on file  . Sexually Abused: Not on file    Outpatient Encounter Medications as of 08/05/2020  Medication Sig  . albuterol (ACCUNEB) 0.63 MG/3ML nebulizer solution Take 1 ampule by nebulization every 4 (four) hours.  Marland Kitchen albuterol (PROVENTIL HFA;VENTOLIN HFA) 108 (90 Base) MCG/ACT inhaler Inhale 2 puffs into the lungs every 6 (six) hours as needed for wheezing or shortness of breath.  Marland Kitchen aspirin EC 81 MG tablet Take 1 tablet (81 mg total) by mouth every morning. (Patient taking differently: Take 81 mg by mouth every other day. )  . atorvastatin (LIPITOR) 80 MG tablet Take 80 mg by mouth at bedtime.   . Budesonide-Formoterol Fumarate (SYMBICORT IN) Inhale 2 puffs into the lungs 2 (two) times daily.  . cetirizine (ZYRTEC) 10 MG tablet Take 10 mg by mouth daily as needed for allergies.  . cholecalciferol (VITAMIN D) 1000 UNITS tablet Take 1,000 Units by mouth daily.  . clopidogrel (PLAVIX) 75 MG tablet Take 1 tablet (75 mg total) by mouth every morning.  . finasteride (PROSCAR) 5 MG tablet Take 5 mg by mouth daily.  . isosorbide mononitrate (IMDUR) 30 MG 24 hr tablet Take 30 mg by mouth daily.  Marland Kitchen  levothyroxine (SYNTHROID, LEVOTHROID) 25 MCG tablet Take 37.5 mcg by mouth daily before breakfast.   . lisinopril-hydrochlorothiazide (PRINZIDE,ZESTORETIC) 10-12.5 MG tablet Take 1 tablet by mouth daily.  . metFORMIN (GLUMETZA) 500 MG (MOD) 24 hr tablet Take 1,000 mg by mouth daily before supper.   . metoprolol (LOPRESSOR) 50 MG tablet Take 25 mg by mouth 2 (two) times daily.  . Omega-3 Fatty Acids (FISH OIL) 1000 MG CAPS Take 1,000 mg by mouth daily.   . tamsulosin (FLOMAX) 0.4 MG CAPS capsule Take 0.4 mg by mouth daily.  . diphenhydrAMINE (BENADRYL) 25 MG tablet Take 25 mg by mouth every 6 (six) hours as needed for itching. (Patient not taking: Reported on 08/05/2020)   No facility-administered encounter medications on file as of 08/05/2020.    Allergies  Allergen Reactions  . Other Itching, Rash and Other (See Comments)    Plastic- nose pieces and watch bands    ROS Review of Systems  Constitutional: Negative for appetite change, chills, fatigue and fever.  HENT: Negative for congestion, dental problem, ear pain and sore throat.   Eyes: Negative for discharge, redness and visual disturbance.  Respiratory: Negative for cough, chest tightness, shortness of breath and wheezing.   Cardiovascular: Negative for chest pain, palpitations and leg swelling.  Gastrointestinal: Negative for abdominal pain, blood in stool, diarrhea, nausea and vomiting.  Genitourinary: Negative for difficulty urinating, dysuria, flank pain, frequency, hematuria and urgency.  Musculoskeletal: Negative for arthralgias, back pain, joint swelling, myalgias and neck stiffness.  Skin: Negative for pallor and rash.  Neurological: Negative for dizziness, speech difficulty, weakness and headaches.  Hematological: Negative for adenopathy. Does not bruise/bleed easily.  Psychiatric/Behavioral: Negative for confusion and sleep disturbance. The patient is not nervous/anxious.     PE; Blood pressure (!) 160/82, pulse 60,  temperature 98.1 F (36.7 C), temperature source Oral, resp. rate 18, height 5\' 10"  (1.778 m), weight 170 lb 2 oz (77.2 kg), SpO2 98 %. Body mass index is 24.41 kg/m.  Gen: Alert, well appearing.  Patient is oriented to person, place, time, and situation. AFFECT: pleasant, lucid thought and speech. ENT: Ears: EACs clear, normal epithelium.  TMs with good light reflex and landmarks bilaterally.  Eyes: no injection, icteris, swelling, or exudate.  EOMI, PERRLA. Nose: no drainage or turbinate edema/swelling.  No injection or focal lesion.  Mouth: lips without lesion/swelling.  Oral mucosa pink and moist.  Upper dentures in place, no lower dentures or teeth.  Oropharynx without erythema, exudate, or swelling.  Neck: supple/nontender.  No LAD, mass, or TM.  Carotid pulses 2+ bilaterally, without bruits. CV: RRR, no m/r/g.   LUNGS: CTA bilat, nonlabored resps, good aeration in all lung fields. ABD: soft, NT, ND, BS normal.  No hepatospenomegaly or mass.  No bruits. EXT: no clubbing, cyanosis, or edema.  Musculoskeletal: no joint swelling, erythema, warmth, or tenderness.  ROM of all joints intact. Skin - no sores or suspicious lesions or rashes or color changes   Pertinent labs:  Lab Results  Component Value Date   TSH 4.248 02/28/2012   Lab Results  Component Value Date   WBC 7.8 02/16/2014   HGB 13.2 02/16/2014   HCT 39.6 02/16/2014   MCV 91.0 02/16/2014   PLT 250 02/16/2014   Lab Results  Component Value Date   CREATININE 1.52 (H) 02/03/2018   BUN 25 (H) 02/03/2018   NA 139 02/03/2018   K 4.5 02/03/2018   CL 104 02/03/2018   CO2 23 02/03/2018   Lab Results  Component Value Date   ALT 10 02/27/2012   AST 17 02/27/2012   ALKPHOS 54 02/27/2012   BILITOT 0.4 02/27/2012   Lab Results  Component Value Date   CHOL 169 02/28/2012   Lab Results  Component Value Date   HDL 41 02/28/2012   Lab Results  Component Value Date   LDLCALC 107 (H) 02/28/2012   Lab Results   Component Value Date   TRIG 104 02/28/2012   Lab Results  Component Value Date   CHOLHDL 4.1 02/28/2012   Lab Results  Component Value Date   HGBA1C 7.4 (H) 02/03/2018   ASSESSMENT AND PLAN:   New pt: Need the lab results from yesterday's blood draw at the [place] + will ask for the last 1 yr of records from [place].  Health maintenance exam: Reviewed age and gender appropriate health maintenance issues (prudent diet, regular exercise, health risks of tobacco and excessive alcohol, use of seatbelts, fire alarms in home, use of sunscreen).  Also reviewed age and gender appropriate health screening as well as vaccine recommendations. Vaccines: our list is showing that he has not had prevnar 13 or shingrix.  Tdap, pneumovax 23, flu, and covid all UTD. We'll try to get vaccine record from the [place] before giving him prevnar 13 vaccine or shingrix. Labs: he got "lab panel" at the [place] yesterday and he'll drop a copy of these off with our front desk when he gets a chance. Prostate ca screening: no further prostate ca screening is indicated. Colon ca screening: no further screening is indicated.  His primary chronic medical dx seem well controlled: Hx of CAD/MI/stent, plus SSS w/mobitz II: asymptomatic.  F/u cardiology annually, gets regular pacer monitoring, takes BB, DAPT, statin, imdur. DM--on metformin. HTN: on zestoretic. HLD: on high dose atorva Hypoth: on low dose of T4. COPD: does fine on symbicort and albuterol. BPH: doing well s/p TURP + continues to take tamsulosin and finasteride--f/u with urologist regularly. OSA: bipap  An After  Visit Summary was printed and given to the patient.  Return in about 6 months (around 02/02/2021) for routine chronic illness f/u.  Signed:  Crissie Sickles, MD           08/05/2020

## 2020-10-01 ENCOUNTER — Ambulatory Visit (INDEPENDENT_AMBULATORY_CARE_PROVIDER_SITE_OTHER): Payer: Medicare Other

## 2020-10-01 DIAGNOSIS — I495 Sick sinus syndrome: Secondary | ICD-10-CM | POA: Diagnosis not present

## 2020-10-01 LAB — CUP PACEART REMOTE DEVICE CHECK
Battery Remaining Longevity: 49 mo
Battery Remaining Percentage: 46 %
Battery Voltage: 2.86 V
Brady Statistic AP VP Percent: 9.4 %
Brady Statistic AP VS Percent: 57 %
Brady Statistic AS VP Percent: 1 %
Brady Statistic AS VS Percent: 33 %
Brady Statistic RA Percent Paced: 65 %
Brady Statistic RV Percent Paced: 9.4 %
Date Time Interrogation Session: 20211124031554
Implantable Lead Implant Date: 20130510
Implantable Lead Implant Date: 20130510
Implantable Lead Location: 753859
Implantable Lead Location: 753860
Implantable Pulse Generator Implant Date: 20130510
Lead Channel Impedance Value: 390 Ohm
Lead Channel Impedance Value: 430 Ohm
Lead Channel Pacing Threshold Amplitude: 1 V
Lead Channel Pacing Threshold Amplitude: 1.25 V
Lead Channel Pacing Threshold Pulse Width: 0.5 ms
Lead Channel Pacing Threshold Pulse Width: 0.6 ms
Lead Channel Sensing Intrinsic Amplitude: 12 mV
Lead Channel Sensing Intrinsic Amplitude: 4.8 mV
Lead Channel Setting Pacing Amplitude: 2.5 V
Lead Channel Setting Pacing Amplitude: 2.5 V
Lead Channel Setting Pacing Pulse Width: 0.6 ms
Lead Channel Setting Sensing Sensitivity: 2 mV
Pulse Gen Model: 2210
Pulse Gen Serial Number: 7336473

## 2020-10-09 NOTE — Progress Notes (Signed)
Remote pacemaker transmission.   

## 2020-10-21 DIAGNOSIS — N200 Calculus of kidney: Secondary | ICD-10-CM | POA: Diagnosis not present

## 2020-12-15 ENCOUNTER — Encounter: Payer: Self-pay | Admitting: Family Medicine

## 2020-12-31 ENCOUNTER — Ambulatory Visit (INDEPENDENT_AMBULATORY_CARE_PROVIDER_SITE_OTHER): Payer: Medicare Other

## 2020-12-31 DIAGNOSIS — I495 Sick sinus syndrome: Secondary | ICD-10-CM | POA: Diagnosis not present

## 2021-01-01 LAB — CUP PACEART REMOTE DEVICE CHECK
Battery Remaining Longevity: 42 mo
Battery Remaining Percentage: 40 %
Battery Voltage: 2.84 V
Brady Statistic AP VP Percent: 10 %
Brady Statistic AP VS Percent: 57 %
Brady Statistic AS VP Percent: 1 %
Brady Statistic AS VS Percent: 32 %
Brady Statistic RA Percent Paced: 66 %
Brady Statistic RV Percent Paced: 10 %
Date Time Interrogation Session: 20220223034833
Implantable Lead Implant Date: 20130510
Implantable Lead Implant Date: 20130510
Implantable Lead Location: 753859
Implantable Lead Location: 753860
Implantable Pulse Generator Implant Date: 20130510
Lead Channel Impedance Value: 380 Ohm
Lead Channel Impedance Value: 440 Ohm
Lead Channel Pacing Threshold Amplitude: 1 V
Lead Channel Pacing Threshold Amplitude: 1.25 V
Lead Channel Pacing Threshold Pulse Width: 0.5 ms
Lead Channel Pacing Threshold Pulse Width: 0.6 ms
Lead Channel Sensing Intrinsic Amplitude: 11.5 mV
Lead Channel Sensing Intrinsic Amplitude: 5 mV
Lead Channel Setting Pacing Amplitude: 2.5 V
Lead Channel Setting Pacing Amplitude: 2.5 V
Lead Channel Setting Pacing Pulse Width: 0.6 ms
Lead Channel Setting Sensing Sensitivity: 2 mV
Pulse Gen Model: 2210
Pulse Gen Serial Number: 7336473

## 2021-01-08 NOTE — Progress Notes (Signed)
Remote pacemaker transmission.   

## 2021-01-23 LAB — BASIC METABOLIC PANEL
BUN: 24 — AB (ref 4–21)
CO2: 25 — AB (ref 13–22)
Chloride: 108 (ref 99–108)
Creatinine: 1.2 (ref 0.6–1.3)
Glucose: 139
Potassium: 4 (ref 3.4–5.3)
Sodium: 137 (ref 137–147)

## 2021-01-23 LAB — HEPATIC FUNCTION PANEL
ALT: 18 (ref 10–40)
AST: 14 (ref 14–40)
Alkaline Phosphatase: 44 (ref 25–125)
Bilirubin, Direct: 0.4 (ref 0.01–0.4)
Bilirubin, Total: 0.6

## 2021-01-23 LAB — COMPREHENSIVE METABOLIC PANEL
Albumin: 3.4 — AB (ref 3.5–5.0)
Calcium: 8.8 (ref 8.7–10.7)
GFR calc non Af Amer: 57

## 2021-01-23 LAB — VITAMIN B12: Vitamin B-12: 258

## 2021-01-23 LAB — CBC AND DIFFERENTIAL
HCT: 41 (ref 41–53)
Hemoglobin: 14.3 (ref 13.5–17.5)
Platelets: 274 (ref 150–399)
WBC: 7.7

## 2021-01-23 LAB — TSH: TSH: 3.01 (ref 0.41–5.90)

## 2021-01-23 LAB — LIPID PANEL
Cholesterol: 106 (ref 0–200)
HDL: 60 (ref 35–70)
Triglycerides: 82 (ref 40–160)

## 2021-01-23 LAB — HEMOGLOBIN A1C: Hemoglobin A1C: 7.3

## 2021-01-23 LAB — CBC: RBC: 4.44 (ref 3.87–5.11)

## 2021-01-30 ENCOUNTER — Ambulatory Visit: Payer: Medicare Other | Admitting: Family Medicine

## 2021-03-02 ENCOUNTER — Other Ambulatory Visit: Payer: Self-pay

## 2021-03-02 ENCOUNTER — Encounter: Payer: Self-pay | Admitting: Cardiovascular Disease

## 2021-03-02 ENCOUNTER — Ambulatory Visit (INDEPENDENT_AMBULATORY_CARE_PROVIDER_SITE_OTHER): Payer: Medicare Other | Admitting: Cardiovascular Disease

## 2021-03-02 VITALS — BP 150/68 | HR 60 | Ht 70.0 in | Wt 173.0 lb

## 2021-03-02 DIAGNOSIS — I2510 Atherosclerotic heart disease of native coronary artery without angina pectoris: Secondary | ICD-10-CM | POA: Diagnosis not present

## 2021-03-02 DIAGNOSIS — I441 Atrioventricular block, second degree: Secondary | ICD-10-CM | POA: Diagnosis not present

## 2021-03-02 DIAGNOSIS — E039 Hypothyroidism, unspecified: Secondary | ICD-10-CM | POA: Diagnosis not present

## 2021-03-02 DIAGNOSIS — I495 Sick sinus syndrome: Secondary | ICD-10-CM

## 2021-03-02 DIAGNOSIS — I10 Essential (primary) hypertension: Secondary | ICD-10-CM

## 2021-03-02 DIAGNOSIS — R55 Syncope and collapse: Secondary | ICD-10-CM

## 2021-03-02 DIAGNOSIS — Z950 Presence of cardiac pacemaker: Secondary | ICD-10-CM

## 2021-03-02 DIAGNOSIS — G4731 Primary central sleep apnea: Secondary | ICD-10-CM

## 2021-03-02 DIAGNOSIS — E7800 Pure hypercholesterolemia, unspecified: Secondary | ICD-10-CM

## 2021-03-02 NOTE — Patient Instructions (Signed)
Medication Instructions:  No changes *If you need a refill on your cardiac medications before your next appointment, please call your pharmacy*   Lab Work: Please have labs faxed to [phone] that you have done at the [place].  If you have labs (blood work) drawn today and your tests are completely normal, you will receive your results only by: Marland Kitchen MyChart Message (if you have MyChart) OR . A paper copy in the mail If you have any lab test that is abnormal or we need to change your treatment, we will call you to review the results.   Testing/Procedures: None ordered   Follow-Up: At [facility], you and your health needs are our priority.  As part of our continuing mission to provide you with exceptional heart care, we have created designated Provider Care Teams.  These Care Teams include your primary Cardiologist (physician) and Advanced Practice Providers (APPs -  Physician Assistants and Nurse Practitioners) who all work together to provide you with the care you need, when you need it.  We recommend signing up for the patient portal called "MyChart".  Sign up information is provided on this After Visit Summary.  MyChart is used to connect with patients for Virtual Visits (Telemedicine).  Patients are able to view lab/test results, encounter notes, upcoming appointments, etc.  Non-urgent messages can be sent to your provider as well.   To learn more about what you can do with MyChart, go to [email or web address].    Your next appointment:   12 month(s)  The format for your next appointment:   In Person  Provider:   Sanda Klein, MD

## 2021-03-02 NOTE — Progress Notes (Signed)
Marshall ID: Lonnie Courier., male   DOB: [birth year], 85 y.o.   MRN: [id number]     Cardiology Office Note   Date:  03/04/2021   ID:  Lonnie Courier., DOB [birth year], MRN [id number]  PCP:  Tammi Sou, MD  Cardiologist:  Shelva Majestic, MD;  Sanda Klein, MD   Chief Complaint  Marshall presents with  . Pacemaker Check     History of Present Illness: Lonnie Laski. is a 85 y.o. male who presents for CAD, SSS, 2nd deg AV block, pacemaker follow up  Lonnie Marshall has history of both sinus node dysfunction and second-degree atrioventricular block Mobitz type I. Lonnie Marshall received a pacemaker in 2013. Lonnie Marshall has a history of coronary disease: ST segment elevation myocardial infarction around Lonnie time of pacemaker implantation. Lonnie Marshall had total proximal LAD occlusion treated with overlapping stents 2.75x24 2.5x38 DES Xience and Promus. 3 days later Lonnie Marshall underwent staged intervention to Lonnie obtuse marginal branch of Lonnie circumflex vessel. In September 2013 a nuclear study showed distal LAD scar but otherwise significant myocardial salvage and other territories. Ejection fraction was 64%. No angina since his acute MI in 2013.  Lonnie Marshall has no complaints, but is worried about Lonnie health of his wife and daughter.  His 31 year old daughter has Crohn's disease and is scheduled to undergo another intestinal surgery at [facility] this week.  Lonnie Marshall specifically denies any chest pain at rest exertion, dyspnea at rest or with light exertion, orthopnea, paroxysmal nocturnal dyspnea, syncope, palpitations, focal neurological deficits, intermittent claudication, lower extremity edema, unexplained weight gain, cough, hemoptysis or wheezing.  Lonnie Marshall does have dyspnea with moderate exertion which improves when Lonnie Marshall uses his inhaler.  Lonnie Marshall has not had any falls or injuries, but complains of his unsteady walking.  Lonnie Marshall is on dual antiplatelet therapy with aspirin and clopidogrel.  Lonnie Marshall gets his labs done at [facility] and just had them done.  Lonnie Marshall thinks these included a lipid profile.  Lonnie Marshall is on maximum dose atorvastatin as well as fish oil supplements.  Comprehensive pacemaker check shows normal device function.  His Saint Jude Accent device has an estimated 3.1-3.9 years of estimated longevity.  Lonnie Marshall has 66% atrial pacing and 11% ventricular pacing.  His heart rate histogram is fairly blunted and we reprogrammed his device from DDD to DDD R today.  Lonnie Marshall has not had any episodes of atrial fibrillation or ventricular tachycardia.   Past Medical History:  Diagnosis Date  . Allergic rhinitis   . Arthritis   . BPH with obstruction/lower urinary tract symptoms    hx of TURP  . COPD (chronic obstructive pulmonary disease) ([place])    onset of sx's around 2013  . Coronary artery disease    MI+stents 2013  . Hearing loss, bilateral    hearing aid--it bothers him to have aid in R ear.  Marland Kitchen History of kidney stones    hx ureterolithiasis requiring procedure  . History of lumbar surgery   . Hypercholesterolemia   . Hypertension   . Hypothyroidism   . Mobitz type 2 second degree atrioventricular block    has pacemaker  . Overactive bladder   . Pacemaker   . Skull fracture ([place]) 1942   "fell out of car; in hospital for some time"  . Sleep apnea    Central + obstructive->bipap (Dr. Claiborne Billings)  . SSS (sick sinus syndrome) ([place])    has pacemaker--followed by Dr. Orene Desanctis  . STEMI (ST  elevation myocardial infarction), [place]  02/27/2012  . Type II diabetes mellitus ([place])     Past Surgical History:  Procedure Laterality Date  . APPENDECTOMY     as part of R colectomy surgery for tubulovillous adenoma  . CARDIAC CATHETERIZATION  02/27/12   "1"  . CIRCUMCISION  1980's  . COLONOSCOPY  2002, 2005, 2015  . CORONARY ANGIOPLASTY WITH STENT PLACEMENT  03/01/12   "+2=3 total"  . CYSTOSCOPY WITH RETROGRADE PYELOGRAM, URETEROSCOPY AND STENT PLACEMENT Left 07/13/2013   Procedure: CYSTOSCOPY WITH LEFT RETROGRADE  PYELOGRAM, LEFT URETEROSCOPY AND left STENT PLACEMENT, cystogram;  Surgeon: Fredricka Bonine, MD;  Location: [facility];  Service: Urology;  Laterality: Left;  . CYSTOSCOPY/URETEROSCOPY/HOLMIUM LASER/STENT PLACEMENT Left 02/07/2018   Procedure: CYSTOSCOPY/LEFT RETROGRADE/LEFT URETEROSCOPY/HOLMIUM LASER LEFT /STENT PLACEMENT;  Surgeon: [name], MD;  Location: [facility];  Service: Urology;  Laterality: Left;  ONLY NEEDS 60 MIN  . INSERT / REPLACE / REMOVE PACEMAKER  03/17/12   "first one"  . LEFT HEART CATHETERIZATION WITH CORONARY ANGIOGRAM N/A 02/27/2012   Procedure: LEFT HEART CATHETERIZATION WITH CORONARY ANGIOGRAM;  Surgeon: Troy Sine, MD;  Location: [facility] CATH LAB;  Service: Cardiovascular;  Laterality: N/A;  . LUMBAR DISC SURGERY  ~ 1985  . PARTIAL COLECTOMY N/A 02/14/2014   for large tubulovillous adenoma in cecum. Path showed high grade dysplasia but no evidence of carcinoma, neg nodes. Procedure: RIGHT COLECTOMY;  Surgeon: Earnstine Regal, MD;  Location: [facility];  Service: General;  Laterality: N/A;  . PERCUTANEOUS CORONARY STENT INTERVENTION (PCI-S) N/A 03/01/2012   Procedure: PERCUTANEOUS CORONARY STENT INTERVENTION (PCI-S);  Surgeon: Leonie Man, MD;  Location: [facility] CATH LAB;  Service: Cardiovascular;  Laterality: N/A;  . PERMANENT PACEMAKER INSERTION N/A 03/17/2012   Procedure: PERMANENT PACEMAKER INSERTION;  Surgeon: Sanda Klein, MD;  Location: [place] CATH LAB;  Service: Cardiovascular;  Laterality: N/A;  . TRANSURETHRAL RESECTION OF PROSTATE N/A 07/13/2013   Procedure: TRANSURETHRAL RESECTION OF Lonnie PROSTATE (TURP);  Surgeon: Fredricka Bonine, MD;  Location: [facility];  Service: Urology;  Laterality: N/A;  . VASECTOMY  ~85 years old   same time as circumcision     Current Outpatient Medications  Medication Sig Dispense Refill  . albuterol (ACCUNEB) 0.63 MG/3ML nebulizer solution Take 1 ampule by nebulization every 4 (four) hours.    Marland Kitchen albuterol (PROVENTIL HFA;VENTOLIN HFA)  108 (90 Base) MCG/ACT inhaler Inhale 2 puffs into Lonnie lungs every 6 (six) hours as needed for wheezing or shortness of breath.    Marland Kitchen aspirin EC 81 MG tablet Take 1 tablet (81 mg total) by mouth every morning. (Marshall taking differently: Take 81 mg by mouth every other day.)    . atorvastatin (LIPITOR) 80 MG tablet Take 80 mg by mouth at bedtime.     . Budesonide-Formoterol Fumarate (SYMBICORT IN) Inhale 2 puffs into Lonnie lungs 2 (two) times daily.    . cetirizine (ZYRTEC) 10 MG tablet Take 10 mg by mouth daily as needed for allergies.    . cholecalciferol (VITAMIN D) 1000 UNITS tablet Take 1,000 Units by mouth daily.    . clopidogrel (PLAVIX) 75 MG tablet Take 1 tablet (75 mg total) by mouth every morning.    . finasteride (PROSCAR) 5 MG tablet Take 5 mg by mouth daily.    . isosorbide mononitrate (IMDUR) 30 MG 24 hr tablet Take 30 mg by mouth daily.    Marland Kitchen levothyroxine (SYNTHROID, LEVOTHROID) 25 MCG tablet Take 37.5 mcg by mouth daily before breakfast.  0  . lisinopril-hydrochlorothiazide (PRINZIDE,ZESTORETIC) 10-12.5 MG tablet Take 1 tablet by mouth daily.    . metFORMIN (GLUMETZA) 500 MG (MOD) 24 hr tablet Take 1,000 mg by mouth daily before supper.     . metoprolol (LOPRESSOR) 50 MG tablet Take 25 mg by mouth 2 (two) times daily.    . Omega-3 Fatty Acids (FISH OIL) 1000 MG CAPS Take 1,000 mg by mouth daily.      No current facility-administered medications for this visit.    Allergies:   Other    Social History:  Lonnie Marshall  reports that Lonnie Marshall quit smoking about 46 years ago. His smoking use included cigarettes. Lonnie Marshall has a 60.00 pack-year smoking history. His smokeless tobacco use includes chew. Lonnie Marshall reports that Lonnie Marshall does not drink alcohol and does not use drugs.    ROS:  Please see Lonnie history of present illness.   All other systems are reviewed and are negative.   PHYSICAL EXAM: VS:  BP (!) 150/68   Pulse 60   Ht 5\' 10"  (1.778 m)   Wt 173 lb (78.5 kg)   SpO2 95%   BMI 24.82 kg/m  , BMI  Body mass index is 24.82 kg/m.   General: Alert, oriented x3, no distress, healthy left subclavian pacemaker site Head: no evidence of trauma, PERRL, EOMI, no exophtalmos or lid lag, no myxedema, no xanthelasma; normal ears, nose and oropharynx Neck: normal jugular venous pulsations and no hepatojugular reflux; brisk carotid pulses without delay and no carotid bruits Chest: clear to auscultation, no signs of consolidation by percussion or palpation, normal fremitus, symmetrical and full respiratory excursions Cardiovascular: normal position and quality of Lonnie apical impulse, regular rhythm, normal first and second heart sounds, no murmurs, rubs or gallops Abdomen: no tenderness or distention, no masses by palpation, no abnormal pulsatility or arterial bruits, normal bowel sounds, no hepatosplenomegaly Extremities: no clubbing, cyanosis or edema; 2+ radial, ulnar and brachial pulses bilaterally; 2+ right femoral, posterior tibial and dorsalis pedis pulses; 2+ left femoral, posterior tibial and dorsalis pedis pulses; no subclavian or femoral bruits Neurological: grossly nonfocal Psych: Normal mood and affect     EKG:  EKG is ordered today.  It shows atrial paced, ventricular sensed rhythm and is otherwise a normal tracing.  QTc is 406 ms  Lipid Panel    Component Value Date/Time   CHOL 169 02/28/2012 0857   TRIG 104 02/28/2012 0857   HDL 41 02/28/2012 0857   CHOLHDL 4.1 02/28/2012 0857   VLDL 21 02/28/2012 0857   LDLCALC 107 (H) 02/28/2012 0857      Wt Readings from Last 3 Encounters:  03/02/21 173 lb (78.5 kg)  08/05/20 170 lb 2 oz (77.2 kg)  02/25/20 171 lb (77.6 kg)     1. SSS (sick sinus syndrome) ([place])   2. Second degree AV block   3. Pacemaker   4. Coronary artery disease involving native coronary artery of native heart without angina pectoris   5. Hypercholesterolemia   6. Acquired hypothyroidism   7. Syncope, vasovagal   8. Complex sleep apnea syndrome   9.  Essential hypertension      ASSESSMENT AND PLAN:  1. SSS: Heart rate histogram is more blunted than in years past.  Rate response was turned on today. 2. Second degree AV block: Ventricular pacing remains infrequent at about 11%. 3. PPM: Programmed DDDR today.  Continue remote downloads every 3 months.  We will plan to adjust his rate response settings in 3-6 months as  needed 4. CAD: 10 years since his acute myocardial infarction and Lonnie Marshall has not had any new coronary events.  Lonnie Marshall is angina free on 2 antianginal medications, on maximum dose atorvastatin and dual antiplatelet therapy. 5. Hyperlipidemia: Labs followed at Lonnie [place].  Target LDL less than 70.  Lonnie Marshall will try to fax [place] a copy. 6. Hypothyroidism: Clinically euthyroid on levothyroxine.  Supplementation. 7. Syncope: This occurred more than 3 years ago with a pattern suggestive of a vasovagal event.  No recurrence. 8. Complex sleep apnea: Mixed obstructive and central, on nocturnal BiPAP, followed by Dr. Claiborne Billings. 9. HTN: Systolic blood pressure is slightly high today, but typically his blood pressure is 130/70, including a few days ago at [facility] no changes were made to his medications.   Current medicines are reviewed at length with Lonnie Marshall today.  Lonnie Marshall does not have concerns regarding medicines.  Lonnie following changes have been made:  Stop using over-Lonnie-counter medications that contain a decongestant.  Labs/ tests ordered today include:   Orders Placed This Encounter  Procedures  . EKG 12-Lead    Marshall Instructions  Medication Instructions:  No changes *If you need a refill on your cardiac medications before your next appointment, please call your pharmacy*   Lab Work: Please have labs faxed to [phone] that you have done at Lonnie [place].  If you have labs (blood work) drawn today and your tests are completely normal, you will receive your results only by: Marland Kitchen MyChart Message (if you have MyChart) OR . A paper  copy in Lonnie mail If you have any lab test that is abnormal or we need to change your treatment, we will call you to review Lonnie results.   Testing/Procedures: None ordered   Follow-Up: At [facility], you and your health needs are our priority.  As part of our continuing mission to provide you with exceptional heart care, we have created designated Provider Care Teams.  These Care Teams include your primary Cardiologist (physician) and Advanced Practice Providers (APPs -  Physician Assistants and Nurse Practitioners) who all work together to provide you with Lonnie care you need, when you need it.  We recommend signing up for Lonnie Marshall portal called "MyChart".  Sign up information is provided on this After Visit Summary.  MyChart is used to connect with patients for Virtual Visits (Telemedicine).  Patients are able to view lab/test results, encounter notes, upcoming appointments, etc.  Non-urgent messages can be sent to your provider as well.   To learn more about what you can do with MyChart, go to [email or web address].    Your next appointment:   12 month(s)  Lonnie format for your next appointment:   In Person  Provider:   Sanda Klein, MD        Signed, Sanda Klein, MD  03/04/2021 9:30 PM    Sanda Klein, MD, [facility] [phone] office [phone] pager

## 2021-03-10 ENCOUNTER — Telehealth: Payer: Self-pay

## 2021-03-10 NOTE — Telephone Encounter (Signed)
Appt has been rescheduled  [place] Day - Client Nonclinical Telephone Record  [facility] Client [place] Day - Client Client Site [place] - Day Physician Crissie Sickles - MD Contact Type Call Who Is Calling Patient / Member / Family / Caregiver Caller Name L[place] Phone Number [phone] Patient Name Jahmani Staup Patient DOB 30-Aug-1936 Call Type Message Only Information Provided Reason for Call Request to Reschedule Office Appointment Initial Comment Caller would like to reschedule his 03/13/21. Caller declined triage. Disp. Time Disposition Final User 03/10/2021 12:50:09 PM General Information Provided Yes Caprice Beaver Call Closed By: Caprice Beaver Transaction Date/Time: 03/10/2021 12:45:05 PM (ET)

## 2021-03-13 ENCOUNTER — Ambulatory Visit: Payer: Medicare Other | Admitting: Family Medicine

## 2021-03-27 ENCOUNTER — Other Ambulatory Visit: Payer: Self-pay | Admitting: Cardiovascular Disease

## 2021-04-01 ENCOUNTER — Ambulatory Visit (INDEPENDENT_AMBULATORY_CARE_PROVIDER_SITE_OTHER): Payer: Medicare Other

## 2021-04-01 DIAGNOSIS — I495 Sick sinus syndrome: Secondary | ICD-10-CM | POA: Diagnosis not present

## 2021-04-02 LAB — CUP PACEART REMOTE DEVICE CHECK
Battery Remaining Longevity: 35 mo
Battery Remaining Percentage: 35 %
Battery Voltage: 2.83 V
Brady Statistic AP VP Percent: 23 %
Brady Statistic AP VS Percent: 65 %
Brady Statistic AS VP Percent: 1 %
Brady Statistic AS VS Percent: 12 %
Brady Statistic RA Percent Paced: 87 %
Brady Statistic RV Percent Paced: 23 %
Date Time Interrogation Session: 20220525032001
Implantable Lead Implant Date: 20130510
Implantable Lead Implant Date: 20130510
Implantable Lead Location: 753859
Implantable Lead Location: 753860
Implantable Pulse Generator Implant Date: 20130510
Lead Channel Impedance Value: 360 Ohm
Lead Channel Impedance Value: 480 Ohm
Lead Channel Pacing Threshold Amplitude: 1 V
Lead Channel Pacing Threshold Amplitude: 1.25 V
Lead Channel Pacing Threshold Pulse Width: 0.5 ms
Lead Channel Pacing Threshold Pulse Width: 0.6 ms
Lead Channel Sensing Intrinsic Amplitude: 11.2 mV
Lead Channel Sensing Intrinsic Amplitude: 4.3 mV
Lead Channel Setting Pacing Amplitude: 2.5 V
Lead Channel Setting Pacing Amplitude: 2.5 V
Lead Channel Setting Pacing Pulse Width: 0.6 ms
Lead Channel Setting Sensing Sensitivity: 2 mV
Pulse Gen Model: 2210
Pulse Gen Serial Number: 7336473

## 2021-04-07 ENCOUNTER — Other Ambulatory Visit: Payer: Self-pay

## 2021-04-07 DIAGNOSIS — E039 Hypothyroidism, unspecified: Secondary | ICD-10-CM | POA: Insufficient documentation

## 2021-04-07 DIAGNOSIS — E785 Hyperlipidemia, unspecified: Secondary | ICD-10-CM | POA: Insufficient documentation

## 2021-04-07 DIAGNOSIS — J309 Allergic rhinitis, unspecified: Secondary | ICD-10-CM | POA: Insufficient documentation

## 2021-04-08 NOTE — Progress Notes (Signed)
OFFICE VISIT  04/09/2021  CC:  Chief Complaint  Patient presents with  . Follow-up    RCI; HTN, HLD. Pt is not fasting; Had recent labs thru [place] on 01/23/21   HPI:    Patient is a 85 y.o. Caucasian male who presents for 8 mo f/u DM 2, HTN, HLD, and hypothyroidism. A/P as of last visit: "New pt: Need the lab results from yesterday's blood draw at the [place] + will ask for the last 1 yr of records from [place].  Health maintenance exam: Reviewed age and gender appropriate health maintenance issues (prudent diet, regular exercise, health risks of tobacco and excessive alcohol, use of seatbelts, fire alarms in home, use of sunscreen).  Also reviewed age and gender appropriate health screening as well as vaccine recommendations. Vaccines: our list is showing that he has not had prevnar 13 or shingrix.  Tdap, pneumovax 23, flu, and covid all UTD. We'll try to get vaccine record from the [place] before giving him prevnar 13 vaccine or shingrix. Labs: he got "lab panel" at the [place] yesterday and he'll drop a copy of these off with our front desk when he gets a chance. Prostate ca screening: no further prostate ca screening is indicated. Colon ca screening: no further screening is indicated.  His primary chronic medical dx seem well controlled: Hx of CAD/MI/stent, plus SSS w/mobitz II: asymptomatic.  F/u cardiology annually, gets regular pacer monitoring, takes BB, DAPT, statin, imdur. DM--on metformin. HTN: on zestoretic. HLD: on high dose atorva Hypoth: on low dose of T4. COPD: does fine on symbicort and albuterol. BPH: doing well s/p TURP + continues to take tamsulosin and finasteride--f/u with urologist regularly. OSA: bipap"  INTERIM HX: He's feeling good. He brings in his labs done at the [facility] 01/23/21 today. LDL 30, Hba1c 7.3%, UA normal, microalb/cr normal, CBC normal, cmet nl except sCr 1.22, BUN 24, Na 139.  TSH 3.0, Vit D level 45.   DM: occ home glucose check, last one he recalls was 130  (?fasting). Feet: no burning, tingling, or numbness.  HTN: only occ home bp check, says "like it is here today", 150s/80s. He doesn't recall HR's.  HLD: takes his atorva 80 qd.  Hypoth: Takes 25 mcgT4 qd on empty stomach w/out any other meds.  ROS as above, plus--> no fevers, no CP, no SOB, no wheezing, no cough, no dizziness, no HAs, no rashes, no melena/hematochezia.  No polyuria or polydipsia.  No myalgias or arthralgias.  No focal weakness, paresthesias, or tremors.  No acute vision or hearing abnormalities.  No dysuria or unusual/new urinary urgency or frequency.  No recent changes in lower legs. No n/v/d or abd pain.  No palpitations.     Past Medical History:  Diagnosis Date  . Allergic rhinitis   . Arthritis   . BPH with obstruction/lower urinary tract symptoms    hx of TURP  . COPD (chronic obstructive pulmonary disease) ([place])    onset of sx's around 2013  . Coronary artery disease    MI+stents 2013  . Hearing loss, bilateral    hearing aid--it bothers him to have aid in R ear.  Marland Kitchen History of kidney stones    hx ureterolithiasis requiring procedure  . History of lumbar surgery   . Hypercholesterolemia   . Hypertension   . Hypothyroidism   . Mobitz type 2 second degree atrioventricular block    has pacemaker  . Overactive bladder   . Pacemaker   . Skull fracture ([place]) 1942   "  fell out of car; in hospital for some time"  . Sleep apnea    Central + obstructive->bipap (Dr. Claiborne Billings)  . SSS (sick sinus syndrome) ([place])    has pacemaker--followed by Dr. Orene Desanctis  . STEMI (ST elevation myocardial infarction), [place]  02/27/2012  . Type II diabetes mellitus ([place])     Past Surgical History:  Procedure Laterality Date  . APPENDECTOMY     as part of R colectomy surgery for tubulovillous adenoma  . CARDIAC CATHETERIZATION  02/27/12   "1"  . CIRCUMCISION  1980's  . COLONOSCOPY  2002, 2005, 2015  . CORONARY ANGIOPLASTY WITH STENT PLACEMENT  03/01/12   "+2=3 total"  .  CYSTOSCOPY WITH RETROGRADE PYELOGRAM, URETEROSCOPY AND STENT PLACEMENT Left 07/13/2013   Procedure: CYSTOSCOPY WITH LEFT RETROGRADE PYELOGRAM, LEFT URETEROSCOPY AND left STENT PLACEMENT, cystogram;  Surgeon: Fredricka Bonine, MD;  Location: [facility];  Service: Urology;  Laterality: Left;  . CYSTOSCOPY/URETEROSCOPY/HOLMIUM LASER/STENT PLACEMENT Left 02/07/2018   Procedure: CYSTOSCOPY/LEFT RETROGRADE/LEFT URETEROSCOPY/HOLMIUM LASER LEFT /STENT PLACEMENT;  Surgeon: [name], MD;  Location: [facility];  Service: Urology;  Laterality: Left;  ONLY NEEDS 60 MIN  . INSERT / REPLACE / REMOVE PACEMAKER  03/17/12   "first one"  . LEFT HEART CATHETERIZATION WITH CORONARY ANGIOGRAM N/A 02/27/2012   Procedure: LEFT HEART CATHETERIZATION WITH CORONARY ANGIOGRAM;  Surgeon: Troy Sine, MD;  Location[facility] CATH LAB;  Service: Cardiovascular;  Laterality: N/A;  . LUMBAR DISC SURGERY  ~ 1985  . PARTIAL COLECTOMY N/A 02/14/2014   for large tubulovillous adenoma in cecum. Path showed high grade dysplasia but no evidence of carcinoma, neg nodes. Procedure: RIGHT COLECTOMY;  Surgeon: Earnstine Regal, MD;  [facility]: [facility];  Service: General;  Laterality: N/A;  . PERCUTANEOUS CORONARY STENT INTERVENTION (PCI-S) N/A 03/01/2012   Procedure: PERCUTANEOUS CORONARY STENT INTERVENTION (PCI-S);  Surgeon: Leonie Man, MD;  Location: [facility] CATH LAB;  Service: Cardiovascular;  Laterality: N/A;  . PERMANENT PACEMAKER INSERTION N/A 03/17/2012   Procedure: PERMANENT PACEMAKER INSERTION;  Surgeon: Sanda Klein, MD;  Location: [place] CATH LAB;  Service: Cardiovascular;  Laterality: N/A;  . TRANSURETHRAL RESECTION OF PROSTATE N/A 07/13/2013   Procedure: TRANSURETHRAL RESECTION OF THE PROSTATE (TURP);  Surgeon: Fredricka Bonine, MD;  [facility]: [facility];  Service: Urology;  Laterality: N/A;  . VASECTOMY  ~85 years old   same time as circumcision    Outpatient Medications Prior to Visit  Medication Sig Dispense Refill  . albuterol  (ACCUNEB) 0.63 MG/3ML nebulizer solution Take 1 ampule by nebulization every 4 (four) hours.    Marland Kitchen aspirin EC 81 MG tablet Take 1 tablet (81 mg total) by mouth every morning. (Patient taking differently: Take 81 mg by mouth every other day.)    . atorvastatin (LIPITOR) 80 MG tablet Take 80 mg by mouth at bedtime.     . cetirizine (ZYRTEC) 10 MG tablet Take 10 mg by mouth daily as needed for allergies.    . cholecalciferol (VITAMIN D) 1000 UNITS tablet Take 1,000 Units by mouth daily.    . clopidogrel (PLAVIX) 75 MG tablet Take 1 tablet (75 mg total) by mouth every morning.    . finasteride (PROSCAR) 5 MG tablet Take 5 mg by mouth daily.    Marland Kitchen glipiZIDE (GLUCOTROL) 5 MG tablet Take by mouth.    . isosorbide mononitrate (IMDUR) 30 MG 24 hr tablet Take 30 mg by mouth daily.    Marland Kitchen levothyroxine (SYNTHROID, LEVOTHROID) 25 MCG tablet Take 37.5 mcg by mouth  daily before breakfast.   0  . lisinopril-hydrochlorothiazide (PRINZIDE,ZESTORETIC) 10-12.5 MG tablet Take 2 tablets by mouth daily.    . metFORMIN (GLUMETZA) 500 MG (MOD) 24 hr tablet Take 1,000 mg by mouth daily before supper.     . metoprolol (LOPRESSOR) 50 MG tablet Take 25 mg by mouth 2 (two) times daily.    . Omega-3 Fatty Acids (FISH OIL) 1000 MG CAPS Take 1,000 mg by mouth daily.     Marland Kitchen albuterol (PROVENTIL HFA;VENTOLIN HFA) 108 (90 Base) MCG/ACT inhaler Inhale 2 puffs into the lungs every 6 (six) hours as needed for wheezing or shortness of breath. (Patient not taking: Reported on 04/09/2021)    . Budesonide-Formoterol Fumarate (SYMBICORT IN) Inhale 2 puffs into the lungs 2 (two) times daily. (Patient not taking: Reported on 04/09/2021)    . fluticasone-salmeterol (ADVAIR) 250-50 MCG/ACT AEPB Take by mouth. (Patient not taking: Reported on 04/09/2021)     No facility-administered medications prior to visit.    Allergies  Allergen Reactions  . Other Itching, Rash and Other (See Comments)    Plastic- nose pieces and watch bands    ROS As per  HPI  PE: Vitals with BMI 04/09/2021 03/02/2021 08/05/2020  Height 5\' 10"  5\' 10"  5\' 10"   Weight 174 lbs 3 oz 173 lbs 170 lbs 2 oz  BMI 25 62.37 62.83  Systolic 151 761 607  Diastolic 80 68 82  Pulse 60 60 60     Gen: Alert, well appearing.  Patient is oriented to person, place, time, and situation. AFFECT: pleasant, lucid thought and speech. Foot exam - no swelling, tenderness or skin or vascular lesions. Color and temperature is normal. Sensation is intact. Peripheral pulses are palpable. Toenails are normal.   LABS:  Lab Results  Component Value Date   TSH 4.248 02/28/2012   Lab Results  Component Value Date   WBC 7.8 02/16/2014   HGB 13.2 02/16/2014   HCT 39.6 02/16/2014   MCV 91.0 02/16/2014   PLT 250 02/16/2014   Lab Results  Component Value Date   CREATININE 1.52 (H) 02/03/2018   BUN 25 (H) 02/03/2018   NA 139 02/03/2018   K 4.5 02/03/2018   CL 104 02/03/2018   CO2 23 02/03/2018   Lab Results  Component Value Date   ALT 10 02/27/2012   AST 17 02/27/2012   ALKPHOS 54 02/27/2012   BILITOT 0.4 02/27/2012   Lab Results  Component Value Date   CHOL 169 02/28/2012   Lab Results  Component Value Date   HDL 41 02/28/2012   Lab Results  Component Value Date   LDLCALC 107 (H) 02/28/2012   Lab Results  Component Value Date   TRIG 104 02/28/2012   Lab Results  Component Value Date   CHOLHDL 4.1 02/28/2012   Lab Results  Component Value Date   HGBA1C 7.4 (H) 02/03/2018   IMPRESSION AND PLAN:  1) DM: a1c about 2.5 mo ago was 7.3%. Plan rpt a1c in 2-3 wks. Feet exam normal today. Cont metformin 1000 qd and glipizide 5 qd.  2) HTN: not well controlled. Discussed goal of 130/80. Increase lisin-hct 10-12.5 to TWO tabs per day. Check bp/hr daily at home. BMET today and rpt at f/u in 2 wks.  3) HLD: LDL 30 about 2.5 mo ago at [place]. He'll get this checked again in a few months at [place].  4) Hypothyroidism: TSH 3.0 about 2.5 mo ago. He'll get rpt TSH at  [facility] in a few onths  at [place]. Cont T4 25mg  qd  5) CRI III; sCr baseline not clear, limited old lab values to go by but estimate 1.2-1.5.  6) Preventative health care: Recommend/give prevnar 20 next visit. Recommend shingrix next visit.  An After Visit Summary was printed and given to the patient.  FOLLOW UP: Return in about 2 weeks (around 04/23/2021) for f/u HTN/CRI.  Signed:  Crissie Sickles, MD           04/09/2021

## 2021-04-09 ENCOUNTER — Ambulatory Visit (INDEPENDENT_AMBULATORY_CARE_PROVIDER_SITE_OTHER): Payer: Medicare Other | Admitting: Family Medicine

## 2021-04-09 ENCOUNTER — Encounter: Payer: Self-pay | Admitting: Family Medicine

## 2021-04-09 ENCOUNTER — Other Ambulatory Visit: Payer: Self-pay

## 2021-04-09 VITALS — BP 157/80 | HR 60 | Temp 97.8°F | Resp 16 | Ht 70.0 in | Wt 174.2 lb

## 2021-04-09 DIAGNOSIS — I10 Essential (primary) hypertension: Secondary | ICD-10-CM

## 2021-04-09 DIAGNOSIS — E119 Type 2 diabetes mellitus without complications: Secondary | ICD-10-CM | POA: Diagnosis not present

## 2021-04-09 DIAGNOSIS — N183 Chronic kidney disease, stage 3 unspecified: Secondary | ICD-10-CM | POA: Diagnosis not present

## 2021-04-09 DIAGNOSIS — E039 Hypothyroidism, unspecified: Secondary | ICD-10-CM | POA: Diagnosis not present

## 2021-04-09 DIAGNOSIS — N2889 Other specified disorders of kidney and ureter: Secondary | ICD-10-CM

## 2021-04-09 DIAGNOSIS — I2510 Atherosclerotic heart disease of native coronary artery without angina pectoris: Secondary | ICD-10-CM

## 2021-04-09 DIAGNOSIS — E7800 Pure hypercholesterolemia, unspecified: Secondary | ICD-10-CM | POA: Diagnosis not present

## 2021-04-09 NOTE — Patient Instructions (Signed)
Take TWO of your lisinopril-hctz tabs every day.  Check your blood pressure and heart rate once a day and write these numbers down to review with me at follow up visit in about 2 weeks.

## 2021-04-10 LAB — BASIC METABOLIC PANEL
BUN: 19 mg/dL (ref 6–23)
CO2: 24 mEq/L (ref 19–32)
Calcium: 9.1 mg/dL (ref 8.4–10.5)
Chloride: 103 mEq/L (ref 96–112)
Creatinine, Ser: 1.25 mg/dL (ref 0.40–1.50)
GFR: 53.34 mL/min — ABNORMAL LOW (ref >60.00)
Glucose, Bld: 177 mg/dL — ABNORMAL HIGH (ref 70–99)
Potassium: 4.8 mEq/L (ref 3.5–5.1)
Sodium: 140 mEq/L (ref 135–145)

## 2021-04-23 ENCOUNTER — Ambulatory Visit (INDEPENDENT_AMBULATORY_CARE_PROVIDER_SITE_OTHER): Payer: Medicare Other | Admitting: Family Medicine

## 2021-04-23 ENCOUNTER — Encounter: Payer: Self-pay | Admitting: Family Medicine

## 2021-04-23 ENCOUNTER — Other Ambulatory Visit: Payer: Self-pay

## 2021-04-23 VITALS — BP 138/72 | HR 60 | Temp 97.5°F | Resp 16 | Ht 70.0 in | Wt 173.2 lb

## 2021-04-23 DIAGNOSIS — N183 Chronic kidney disease, stage 3 unspecified: Secondary | ICD-10-CM

## 2021-04-23 DIAGNOSIS — I10 Essential (primary) hypertension: Secondary | ICD-10-CM

## 2021-04-23 LAB — BASIC METABOLIC PANEL
BUN: 25 mg/dL — ABNORMAL HIGH (ref 6–23)
CO2: 26 mEq/L (ref 19–32)
Calcium: 9 mg/dL (ref 8.4–10.5)
Chloride: 103 mEq/L (ref 96–112)
Creatinine, Ser: 1.3 mg/dL (ref 0.40–1.50)
GFR: 50.87 mL/min — ABNORMAL LOW (ref >60.00)
Glucose, Bld: 189 mg/dL — ABNORMAL HIGH (ref 70–99)
Potassium: 4.5 mEq/L (ref 3.5–5.1)
Sodium: 138 mEq/L (ref 135–145)

## 2021-04-23 MED ORDER — AMLODIPINE BESYLATE 5 MG PO TABS: 5.0000 mg | ORAL_TABLET | Freq: Every day | ORAL | 3 refills | Status: AC

## 2021-04-23 NOTE — Progress Notes (Signed)
Remote pacemaker transmission.   

## 2021-04-23 NOTE — Progress Notes (Signed)
OFFICE VISIT  04/23/2021  CC:  Chief Complaint  Patient presents with   Follow-up    HTN, CRI   HPI:    Patient is a 85 y.o. Caucasian male who presents for 2 wk f/u uncontrolled HTN. A/P as of last visit: "1) DM: a1c about 2.5 mo ago was 7.3%. Plan rpt a1c in 2-3 wks. Feet exam normal today. Cont metformin 1000 qd and glipizide 5 qd.   2) HTN: not well controlled. Discussed goal of 130/80. Increase lisin-hct 10-12.5 to TWO tabs per day. Check bp/hr daily at home. BMET today and rpt at f/u in 2 wks.   3) HLD: LDL 30 about 2.5 mo ago at [place]. He'll get this checked again in a few months at [place].   4) Hypothyroidism: TSH 3.0 about 2.5 mo ago. He'll get rpt T[facility] in a few onths at [place]. Cont T4 25mg  qd   5) CRI III; sCr baseline not clear, limited old lab values to go by but estimate 1.2-1.5.   6) Preventative health care: Recommend/give prevnar 20 next visit. Recommend shingrix next visit."  INTERIM HX: Feeling well.  BPs: avg 140 syst (highest 160, lowest 130) , avg 70 diast, HR low 60s. Compliant with meds w/out problem.   Past Medical History:  Diagnosis Date   Allergic rhinitis    Arthritis    BPH with obstruction/lower urinary tract symptoms    hx of TURP   Chronic renal insufficiency, stage 3 (moderate) ([facility])    GFR 50s   COPD (chronic obstructive pulmonary disease) ([place])    onset of sx's around 2013   Coronary artery disease    MI+stents 2013   Hearing loss, bilateral    hearing aid--it bothers him to have aid in R ear.   History of kidney stones    hx ureterolithiasis requiring procedure   History of lumbar surgery    Hypercholesterolemia    Hypertension    Hypothyroidism    Mobitz type 2 second degree atrioventricular block    has pacemaker   Overactive bladder    Pacemaker    Skull fracture ([place]) 1942   "fell out of car; in hospital for some time"   Sleep apnea    Central + obstructive->bipap (Dr. Claiborne Billings)   SSS (sick sinus syndrome) ([place])     has pacemaker--followed by Dr. Orene Desanctis   STEMI (ST elevation myocardial infarction), [place]  02/27/2012   Type II diabetes mellitus ([place])     Past Surgical History:  Procedure Laterality Date   APPENDECTOMY     as part of R colectomy surgery for tubulovillous adenoma   CARDIAC CATHETERIZATION  02/27/12   "1"   CIRCUMCISION  1980's   COLONOSCOPY  2002, 2005, 2015   CORONARY ANGIOPLASTY WITH STENT PLACEMENT  03/01/12   "+2=3 total"   CYSTOSCOPY WITH RETROGRADE PYELOGRAM, URETEROSCOPY AND STENT PLACEMENT Left 07/13/2013   Procedure: CYSTOSCOPY WITH LEFT RETROGRADE PYELOGRAM, LEFT URETEROSCOPY AND left STENT PLACEMENT, cystogram;  Surgeon: Fredricka Bonine, MD;  [facility];  Service: Urology;  Laterality: Left;   CYSTOSCOPY/URETEROSCOPY/HOLMIUM LASER/STENT PLACEMENT Left 02/07/2018   Procedure: CYSTOSCOPY/LEFT RETROGRADE/LEFT URETEROSCOPY/HOLMIUM LASER LEFT /STENT PLACEMENT;  Surgeon: [name], MD;  [facility]: [facility];  Service: Urology;  Laterality: Left;  ONLY NEEDS 60 MIN   INSERT / REPLACE / REMOVE PACEMAKER  03/17/12   "first one"   LEFT HEART CATHETERIZATION WITH CORONARY ANGIOGRAM N/A 02/27/2012   Procedure: LEFT HEART CATHETERIZATION WITH CORONARY ANGIOGRAM;  Surgeon: Marcello Moores  Floyce Stakes, MD;  Location: [facility] CATH LAB;  Service: Cardiovascular;  Laterality: N/A;   LUMBAR DISC SURGERY  ~ [place] N/A 02/14/2014   for large tubulovillous adenoma in cecum. Path showed high grade dysplasia but no evidence of carcinoma, neg nodes. Procedure: RIGHT COLECTOMY;  Surgeon: Earnstine Regal, MD;  [facility];  Service: General;  Laterality: N/A;   PERCUTANEOUS CORONARY STENT INTERVENTION (PCI-S) N/A 03/01/2012   Procedure: PERCUTANEOUS CORONARY STENT INTERVENTION (PCI-S);  Surgeon: Leonie Man, MD;  Location: [facility] CATH LAB;  Service: Cardiovascular;  Laterality: N/A;   PERMANENT PACEMAKER INSERTION N/A 03/17/2012   Procedure: PERMANENT PACEMAKER INSERTION;  Surgeon:  Sanda Klein, MD;  Location: [place] CATH LAB;  Service: Cardiovascular;  Laterality: N/A;   TRANSURETHRAL RESECTION OF PROSTATE N/A 07/13/2013   Procedure: TRANSURETHRAL RESECTION OF THE PROSTATE (TURP);  Surgeon: Fredricka Bonine, MD;  [facility]: [facility];  Service: Urology;  Laterality: N/A;   59  ~85 years old   same time as circumcision    Outpatient Medications Prior to Visit  Medication Sig Dispense Refill   albuterol (ACCUNEB) 0.63 MG/3ML nebulizer solution Take 1 ampule by nebulization every 4 (four) hours.     albuterol (PROVENTIL HFA;VENTOLIN HFA) 108 (90 Base) MCG/ACT inhaler Inhale 2 puffs into the lungs every 6 (six) hours as needed for wheezing or shortness of breath.     aspirin EC 81 MG tablet Take 1 tablet (81 mg total) by mouth every morning. (Patient taking differently: Take 81 mg by mouth every other day.)     atorvastatin (LIPITOR) 80 MG tablet Take 80 mg by mouth at bedtime.      cetirizine (ZYRTEC) 10 MG tablet Take 10 mg by mouth daily as needed for allergies.     cholecalciferol (VITAMIN D) 1000 UNITS tablet Take 1,000 Units by mouth daily.     clopidogrel (PLAVIX) 75 MG tablet Take 1 tablet (75 mg total) by mouth every morning.     finasteride (PROSCAR) 5 MG tablet Take 5 mg by mouth daily.     glipiZIDE (GLUCOTROL) 5 MG tablet Take by mouth.     isosorbide mononitrate (IMDUR) 30 MG 24 hr tablet Take 30 mg by mouth daily.     levothyroxine (SYNTHROID, LEVOTHROID) 25 MCG tablet Take 37.5 mcg by mouth daily before breakfast.   0   lisinopril-hydrochlorothiazide (PRINZIDE,ZESTORETIC) 10-12.5 MG tablet Take 2 tablets by mouth daily.     metFORMIN (GLUMETZA) 500 MG (MOD) 24 hr tablet Take 1,000 mg by mouth daily before supper.      metoprolol (LOPRESSOR) 50 MG tablet Take 25 mg by mouth 2 (two) times daily.     Omega-3 Fatty Acids (FISH OIL) 1000 MG CAPS Take 1,000 mg by mouth daily.      Budesonide-Formoterol Fumarate (SYMBICORT IN) Inhale 2 puffs into the lungs 2  (two) times daily. (Patient not taking: No sig reported)     fluticasone-salmeterol (ADVAIR) 250-50 MCG/ACT AEPB Take by mouth. (Patient not taking: No sig reported)     No facility-administered medications prior to visit.    Allergies  Allergen Reactions   Other Itching, Rash and Other (See Comments)    Plastic- nose pieces and watch bands    ROS As per HPI  PE: Vitals with BMI 04/23/2021 04/09/2021 03/02/2021  Height 5\' 10"  5\' 10"  5\' 10"   Weight 173 lbs 3 oz 174 lbs 3 oz 173 lbs  BMI 47.42 25 59.56  Systolic 387 564 332  Diastolic 72 80 68  Pulse 60 60 60     Gen: Alert, well appearing.  Patient is oriented to person, place, time, and situation. AFFECT: pleasant, lucid thought and speech. CV: RRR (rate low 60s), distant S1 and S2, no m/r/g.   LUNGS: CTA bilat, nonlabored resps, good aeration in all lung fields. EXT: no clubbing or cyanosis.  no edema.    LABS:  Lab Results  Component Value Date   TSH 3.01 01/23/2021   Lab Results  Component Value Date   WBC 7.7 01/23/2021   HGB 14.3 01/23/2021   HCT 41 01/23/2021   MCV 91.0 02/16/2014   PLT 274 01/23/2021   Lab Results  Component Value Date   CREATININE 1.25 04/09/2021   BUN 19 04/09/2021   NA 140 04/09/2021   K 4.8 04/09/2021   CL 103 04/09/2021   CO2 24 04/09/2021   Lab Results  Component Value Date   ALT 18 01/23/2021   AST 14 01/23/2021   ALKPHOS 44 01/23/2021   BILITOT 0.4 02/27/2012   Lab Results  Component Value Date   CHOL 106 01/23/2021   Lab Results  Component Value Date   HDL 60 01/23/2021   Lab Results  Component Value Date   LDLCALC 107 (H) 02/28/2012   Lab Results  Component Value Date   TRIG 82 01/23/2021   Lab Results  Component Value Date   CHOLHDL 4.1 02/28/2012   Lab Results  Component Value Date   HGBA1C 7.3 01/23/2021    IMPRESSION AND PLAN:  1) Uncontrolled HTN: still need to work on lowering systolics->goal 528 avg or better. Add amlodipine 5mg  qd, continue  lisin-hct TWO of the 10-12.5 mg tabs daily, and lopressor 25mg  bid.  Recheck BMET today.  2) CRI III: sCr stable at 1.25 last check. Recheck BMET today. An After Visit Summary was printed and given to the patient.  FOLLOW UP: Return in about 4 weeks (around 05/21/2021) for f/u HTN.  Signed:  Crissie Sickles, MD           04/23/2021

## 2021-05-21 ENCOUNTER — Ambulatory Visit (INDEPENDENT_AMBULATORY_CARE_PROVIDER_SITE_OTHER): Payer: Medicare Other | Admitting: Family Medicine

## 2021-05-21 ENCOUNTER — Encounter: Payer: Self-pay | Admitting: Family Medicine

## 2021-05-21 ENCOUNTER — Other Ambulatory Visit: Payer: Self-pay

## 2021-05-21 VITALS — BP 119/62 | HR 60 | Temp 97.8°F | Resp 16 | Ht 70.0 in | Wt 173.2 lb

## 2021-05-21 DIAGNOSIS — N183 Chronic kidney disease, stage 3 unspecified: Secondary | ICD-10-CM | POA: Diagnosis not present

## 2021-05-21 DIAGNOSIS — I10 Essential (primary) hypertension: Secondary | ICD-10-CM | POA: Diagnosis not present

## 2021-05-21 NOTE — Progress Notes (Signed)
OFFICE VISIT  05/21/2021  CC:  Chief Complaint  Patient presents with   Follow-up    HTN    HPI:    Patient is a 85 y.o. Caucasian male who presents for 1 mo f/u uncontrolled htn in the setting of CRI III. A/P as of last visit: "1) Uncontrolled HTN: still need to work on lowering systolics->goal 976 avg or better. Add amlodipine 5mg  qd, continue lisin-hct TWO of the 10-12.5 mg tabs daily, and lopressor 25mg  bid.  Recheck BMET today.   2) CRI III: sCr stable at 1.25 last check. Recheck BMET today. An After Visit Summary was printed and given to the patient."  INTERIM HX: Feeling well.  Home bp's: daily checks over the last month have shown avg 120/60 (lowest syst 102, highest 135).  88 bpm--only one hr check. No side effects from amlodipine.  DM: on glipizide xl 5mg  qd and 1000 metformin xr qd.  Past Medical History:  Diagnosis Date   Allergic rhinitis    Arthritis    BPH with obstruction/lower urinary tract symptoms    hx of TURP   Chronic renal insufficiency, stage 3 (moderate) (HCC)    GFR 50s   COPD (chronic obstructive pulmonary disease) ([place])    onset of sx's around 2013   Coronary artery disease    MI+stents 2013   Hearing loss, bilateral    hearing aid--it bothers him to have aid in R ear.   History of kidney stones    hx ureterolithiasis requiring procedure   History of lumbar surgery    Hypercholesterolemia    Hypertension    Hypothyroidism    Mobitz type 2 second degree atrioventricular block    has pacemaker   Overactive bladder    Pacemaker    Skull fracture ([place]) 1942   "fell out of car; in hospital for some time"   Sleep apnea    Central + obstructive->bipap (Dr. Claiborne Billings)   SSS (sick sinus syndrome) ([place])    has pacemaker--followed by Dr. Orene Desanctis   STEMI (ST elevation myocardial infarction), [place]  02/27/2012   Type II diabetes mellitus ([place])     Past Surgical History:  Procedure Laterality Date   APPENDECTOMY     as part of R  colectomy surgery for tubulovillous adenoma   CARDIAC CATHETERIZATION  02/27/12   "1"   CIRCUMCISION  1980's   COLONOSCOPY  2002, 2005, 2015   CORONARY ANGIOPLASTY WITH STENT PLACEMENT  03/01/12   "+2=3 total"   CYSTOSCOPY WITH RETROGRADE PYELOGRAM, URETEROSCOPY AND STENT PLACEMENT Left 07/13/2013   Procedure: CYSTOSCOPY WITH LEFT RETROGRADE PYELOGRAM, LEFT URETEROSCOPY AND left STENT PLACEMENT, cystogram;  Surgeon: Fredricka Bonine, MD;  Location: [facility];  Service: Urology;  Laterality: Left;   CYSTOSCOPY/URETEROSCOPY/HOLMIUM LASER/STENT PLACEMENT Left 02/07/2018   Procedure: CYSTOSCOPY/LEFT RETROGRADE/LEFT URETEROSCOPY/HOLMIUM LASER LEFT /STENT PLACEMENT;  Surgeon: [name], MD;  [facility]: [facility];  Service: Urology;  Laterality: Left;  ONLY NEEDS 60 MIN   INSERT / REPLACE / REMOVE PACEMAKER  03/17/12   "first one"   LEFT HEART CATHETERIZATION WITH CORONARY ANGIOGRAM N/A 02/27/2012   Procedure: LEFT HEART CATHETERIZATION WITH CORONARY ANGIOGRAM;  Surgeon: Troy Sine, MD;  Location: [facility] CATH LAB;  Service: Cardiovascular;  Laterality: N/A;   LUMBAR DISC SURGERY  ~ [place] N/A 02/14/2014   for large tubulovillous adenoma in cecum. Path showed high grade dysplasia but no evidence of carcinoma, neg nodes. Procedure: RIGHT COLECTOMY;  Surgeon: Earnstine Regal, MD;  [facility]: [facility];  Service: General;  Laterality: N/A;   PERCUTANEOUS CORONARY STENT INTERVENTION (PCI-S) N/A 03/01/2012   Procedure: PERCUTANEOUS CORONARY STENT INTERVENTION (PCI-S);  Surgeon: Leonie Man, MD;  Location: [facility] CATH LAB;  Service: Cardiovascular;  Laterality: N/A;   PERMANENT PACEMAKER INSERTION N/A 03/17/2012   Procedure: PERMANENT PACEMAKER INSERTION;  Surgeon: Sanda Klein, MD;  Location: [facility] CATH LAB;  Service: Cardiovascular;  Laterality: N/A;   TRANSURETHRAL RESECTION OF PROSTATE N/A 07/13/2013   Procedure: TRANSURETHRAL RESECTION OF THE PROSTATE (TURP);  Surgeon: Fredricka Bonine, MD;   [facility];  Service: Urology;  Laterality: N/A;   80  ~85 years old   same time as circumcision    Outpatient Medications Prior to Visit  Medication Sig Dispense Refill   albuterol (ACCUNEB) 0.63 MG/3ML nebulizer solution Take 1 ampule by nebulization every 4 (four) hours.     albuterol (PROVENTIL HFA;VENTOLIN HFA) 108 (90 Base) MCG/ACT inhaler Inhale 2 puffs into the lungs every 6 (six) hours as needed for wheezing or shortness of breath.     amLODipine (NORVASC) 5 MG tablet Take 1 tablet (5 mg total) by mouth daily. 90 tablet 3   aspirin EC 81 MG tablet Take 1 tablet (81 mg total) by mouth every morning. (Patient taking differently: Take 81 mg by mouth every other day.)     atorvastatin (LIPITOR) 80 MG tablet Take 80 mg by mouth at bedtime.      cetirizine (ZYRTEC) 10 MG tablet Take 10 mg by mouth daily as needed for allergies.     cholecalciferol (VITAMIN D) 1000 UNITS tablet Take 1,000 Units by mouth daily.     clopidogrel (PLAVIX) 75 MG tablet Take 1 tablet (75 mg total) by mouth every morning.     finasteride (PROSCAR) 5 MG tablet Take 5 mg by mouth daily.     glipiZIDE (GLUCOTROL) 5 MG tablet Take by mouth.     isosorbide mononitrate (IMDUR) 30 MG 24 hr tablet Take 30 mg by mouth daily.     levothyroxine (SYNTHROID, LEVOTHROID) 25 MCG tablet Take 37.5 mcg by mouth daily before breakfast.   0   lisinopril-hydrochlorothiazide (PRINZIDE,ZESTORETIC) 10-12.5 MG tablet Take 2 tablets by mouth daily.     metFORMIN (GLUMETZA) 500 MG (MOD) 24 hr tablet Take 1,000 mg by mouth daily before supper.      metoprolol (LOPRESSOR) 50 MG tablet Take 25 mg by mouth 2 (two) times daily.     Omega-3 Fatty Acids (FISH OIL) 1000 MG CAPS Take 1,000 mg by mouth daily.      No facility-administered medications prior to visit.    Allergies  Allergen Reactions   Other Itching, Rash and Other (See Comments)    Plastic- nose pieces and watch bands    ROS As per HPI  PE: Vitals with BMI  05/21/2021 04/23/2021 04/09/2021  Height 5\' 10"  5\' 10"  5\' 10"   Weight 173 lbs 3 oz 173 lbs 3 oz 174 lbs 3 oz  BMI 08.14 48.18 25  Systolic 563 149 702  Diastolic 62 72 80  Pulse 60 60 60  Gen: Alert, well appearing.  Patient is oriented to person, place, time, and situation. AFFECT: pleasant, lucid thought and speech. CV: RRR, no m/r/g.   LUNGS: CTA bilat, nonlabored resps, good aeration in all lung fields. EXT: no clubbing or cyanosis.  no edema.    LABS:    Chemistry      Component Value Date/Time   NA 138 04/23/2021 6378  NA 137 01/23/2021 0000   K 4.5 04/23/2021 0952   CL 103 04/23/2021 0952   CO2 26 04/23/2021 0952   BUN 25 (H) 04/23/2021 0952   BUN 24 (A) 01/23/2021 0000   CREATININE 1.30 04/23/2021 0952   GLU 139 01/23/2021 0000      Component Value Date/Time   CALCIUM 9.0 04/23/2021 0952   ALKPHOS 44 01/23/2021 0000   AST 14 01/23/2021 0000   ALT 18 01/23/2021 0000   BILITOT 0.4 02/27/2012 1250     Lab Results  Component Value Date   HGBA1C 7.3 01/23/2021    IMPRESSION AND PLAN:  1) HTN, now well controlled on lisin-hct 10-12.5 TWO tabs qd, 1/2 of 50mg  lopressor bid, and amlodipine 5mg  qd.  2) CRI III: sCr stable at 1.3 and lytes were normal 1 mo ago. No new lab needed today.  3) DM 2, on glipizide xl 5mg  qd and 1000 metformin xr qd.. Pt states he gets rechecked at [facility] August.  4) Preventative health care: Vaccines all UTD including shingrix and covid x 3--plans on getting 2nd booster soon. (Shingrix not in system, though).  An After Visit Summary was printed and given to the patient.  FOLLOW UP: Return in about 6 months (around 11/21/2021) for routine chronic illness f/u.  Signed:  Crissie Sickles, MD           05/21/2021

## 2021-06-26 ENCOUNTER — Telehealth: Payer: Self-pay | Admitting: Family Medicine

## 2021-06-26 NOTE — Telephone Encounter (Signed)
Left message for patient to schedule Annual Wellness Visit.  Please schedule with Nurse Health Advisor Leroy Kennedy, RN at [facility].

## 2021-07-01 ENCOUNTER — Ambulatory Visit (INDEPENDENT_AMBULATORY_CARE_PROVIDER_SITE_OTHER): Payer: Medicare Other

## 2021-07-01 DIAGNOSIS — I495 Sick sinus syndrome: Secondary | ICD-10-CM

## 2021-07-01 LAB — CUP PACEART REMOTE DEVICE CHECK
Battery Remaining Longevity: 17 mo
Battery Remaining Percentage: 18 %
Battery Voltage: 2.81 V
Brady Statistic AP VP Percent: 28 %
Brady Statistic AP VS Percent: 60 %
Brady Statistic AS VP Percent: 1 %
Brady Statistic AS VS Percent: 13 %
Brady Statistic RA Percent Paced: 87 %
Brady Statistic RV Percent Paced: 28 %
Date Time Interrogation Session: 20220824035100
Implantable Lead Implant Date: 20130510
Implantable Lead Implant Date: 20130510
Implantable Lead Location: 753859
Implantable Lead Location: 753860
Implantable Pulse Generator Implant Date: 20130510
Lead Channel Impedance Value: 360 Ohm
Lead Channel Impedance Value: 430 Ohm
Lead Channel Pacing Threshold Amplitude: 1 V
Lead Channel Pacing Threshold Amplitude: 1.25 V
Lead Channel Pacing Threshold Pulse Width: 0.5 ms
Lead Channel Pacing Threshold Pulse Width: 0.6 ms
Lead Channel Sensing Intrinsic Amplitude: 12 mV
Lead Channel Sensing Intrinsic Amplitude: 4.3 mV
Lead Channel Setting Pacing Amplitude: 2.5 V
Lead Channel Setting Pacing Amplitude: 2.5 V
Lead Channel Setting Pacing Pulse Width: 0.6 ms
Lead Channel Setting Sensing Sensitivity: 2 mV
Pulse Gen Model: 2210
Pulse Gen Serial Number: 7336473

## 2021-07-16 NOTE — Progress Notes (Signed)
Remote pacemaker transmission.   

## 2021-07-22 ENCOUNTER — Ambulatory Visit: Payer: Medicare Other

## 2021-09-30 ENCOUNTER — Ambulatory Visit (INDEPENDENT_AMBULATORY_CARE_PROVIDER_SITE_OTHER): Payer: Medicare Other

## 2021-09-30 DIAGNOSIS — I495 Sick sinus syndrome: Secondary | ICD-10-CM

## 2021-09-30 LAB — CUP PACEART REMOTE DEVICE CHECK
Battery Remaining Longevity: 14 mo
Battery Remaining Percentage: 15 %
Battery Voltage: 2.8 V
Brady Statistic AP VP Percent: 25 %
Brady Statistic AP VS Percent: 57 %
Brady Statistic AS VP Percent: 1 %
Brady Statistic AS VS Percent: 17 %
Brady Statistic RA Percent Paced: 82 %
Brady Statistic RV Percent Paced: 25 %
Date Time Interrogation Session: 20221123032114
Implantable Lead Implant Date: 20130510
Implantable Lead Implant Date: 20130510
Implantable Lead Location: 753859
Implantable Lead Location: 753860
Implantable Pulse Generator Implant Date: 20130510
Lead Channel Impedance Value: 360 Ohm
Lead Channel Impedance Value: 410 Ohm
Lead Channel Pacing Threshold Amplitude: 1 V
Lead Channel Pacing Threshold Amplitude: 1.25 V
Lead Channel Pacing Threshold Pulse Width: 0.5 ms
Lead Channel Pacing Threshold Pulse Width: 0.6 ms
Lead Channel Sensing Intrinsic Amplitude: 12 mV
Lead Channel Sensing Intrinsic Amplitude: 4.1 mV
Lead Channel Setting Pacing Amplitude: 2.5 V
Lead Channel Setting Pacing Amplitude: 2.5 V
Lead Channel Setting Pacing Pulse Width: 0.6 ms
Lead Channel Setting Sensing Sensitivity: 2 mV
Pulse Gen Model: 2210
Pulse Gen Serial Number: 7336473

## 2021-10-12 NOTE — Progress Notes (Signed)
Remote pacemaker transmission.   

## 2021-11-23 ENCOUNTER — Ambulatory Visit: Payer: Medicare Other | Admitting: Family Medicine

## 2021-12-30 ENCOUNTER — Ambulatory Visit (INDEPENDENT_AMBULATORY_CARE_PROVIDER_SITE_OTHER): Payer: Medicare Other

## 2021-12-30 DIAGNOSIS — I495 Sick sinus syndrome: Secondary | ICD-10-CM

## 2021-12-30 LAB — CUP PACEART REMOTE DEVICE CHECK
Battery Remaining Longevity: 12 mo
Battery Remaining Percentage: 12 %
Battery Voltage: 2.77 V
Brady Statistic AP VP Percent: 28 %
Brady Statistic AP VS Percent: 57 %
Brady Statistic AS VP Percent: 1 %
Brady Statistic AS VS Percent: 15 %
Brady Statistic RA Percent Paced: 84 %
Brady Statistic RV Percent Paced: 28 %
Date Time Interrogation Session: 20230222020009
Implantable Lead Implant Date: 20130510
Implantable Lead Implant Date: 20130510
Implantable Lead Location: 753859
Implantable Lead Location: 753860
Implantable Pulse Generator Implant Date: 20130510
Lead Channel Impedance Value: 360 Ohm
Lead Channel Impedance Value: 510 Ohm
Lead Channel Pacing Threshold Amplitude: 1 V
Lead Channel Pacing Threshold Amplitude: 1.25 V
Lead Channel Pacing Threshold Pulse Width: 0.5 ms
Lead Channel Pacing Threshold Pulse Width: 0.6 ms
Lead Channel Sensing Intrinsic Amplitude: 12 mV
Lead Channel Sensing Intrinsic Amplitude: 4.6 mV
Lead Channel Setting Pacing Amplitude: 2.5 V
Lead Channel Setting Pacing Amplitude: 2.5 V
Lead Channel Setting Pacing Pulse Width: 0.6 ms
Lead Channel Setting Sensing Sensitivity: 2 mV
Pulse Gen Model: 2210
Pulse Gen Serial Number: 7336473

## 2022-01-06 NOTE — Progress Notes (Signed)
Remote pacemaker transmission.   

## 2022-03-31 ENCOUNTER — Ambulatory Visit (INDEPENDENT_AMBULATORY_CARE_PROVIDER_SITE_OTHER): Payer: Medicare Other

## 2022-03-31 DIAGNOSIS — I495 Sick sinus syndrome: Secondary | ICD-10-CM

## 2022-03-31 LAB — CUP PACEART REMOTE DEVICE CHECK
Battery Remaining Longevity: 9 mo
Battery Remaining Percentage: 10 %
Battery Voltage: 2.74 V
Brady Statistic AP VP Percent: 31 %
Brady Statistic AP VS Percent: 54 %
Brady Statistic AS VP Percent: 1 %
Brady Statistic AS VS Percent: 15 %
Brady Statistic RA Percent Paced: 85 %
Brady Statistic RV Percent Paced: 31 %
Date Time Interrogation Session: 20230524032423
Implantable Lead Implant Date: 20130510
Implantable Lead Implant Date: 20130510
Implantable Lead Location: 753859
Implantable Lead Location: 753860
Implantable Pulse Generator Implant Date: 20130510
Lead Channel Impedance Value: 360 Ohm
Lead Channel Impedance Value: 430 Ohm
Lead Channel Pacing Threshold Amplitude: 1 V
Lead Channel Pacing Threshold Amplitude: 1.25 V
Lead Channel Pacing Threshold Pulse Width: 0.5 ms
Lead Channel Pacing Threshold Pulse Width: 0.6 ms
Lead Channel Sensing Intrinsic Amplitude: 11.5 mV
Lead Channel Sensing Intrinsic Amplitude: 4.4 mV
Lead Channel Setting Pacing Amplitude: 2.5 V
Lead Channel Setting Pacing Amplitude: 2.5 V
Lead Channel Setting Pacing Pulse Width: 0.6 ms
Lead Channel Setting Sensing Sensitivity: 2 mV
Pulse Gen Model: 2210
Pulse Gen Serial Number: 7336473

## 2022-04-13 NOTE — Progress Notes (Signed)
Remote pacemaker transmission.   

## 2022-06-07 ENCOUNTER — Ambulatory Visit (INDEPENDENT_AMBULATORY_CARE_PROVIDER_SITE_OTHER): Payer: Medicare Other | Admitting: Cardiovascular Disease

## 2022-06-07 ENCOUNTER — Encounter: Payer: Self-pay | Admitting: Cardiovascular Disease

## 2022-06-07 VITALS — BP 124/62 | HR 60 | Ht 70.0 in | Wt 167.6 lb

## 2022-06-07 DIAGNOSIS — E039 Hypothyroidism, unspecified: Secondary | ICD-10-CM

## 2022-06-07 DIAGNOSIS — R55 Syncope and collapse: Secondary | ICD-10-CM

## 2022-06-07 DIAGNOSIS — Z950 Presence of cardiac pacemaker: Secondary | ICD-10-CM | POA: Diagnosis not present

## 2022-06-07 DIAGNOSIS — I2510 Atherosclerotic heart disease of native coronary artery without angina pectoris: Secondary | ICD-10-CM

## 2022-06-07 DIAGNOSIS — I441 Atrioventricular block, second degree: Secondary | ICD-10-CM

## 2022-06-07 DIAGNOSIS — I495 Sick sinus syndrome: Secondary | ICD-10-CM | POA: Diagnosis not present

## 2022-06-07 DIAGNOSIS — I10 Essential (primary) hypertension: Secondary | ICD-10-CM

## 2022-06-07 DIAGNOSIS — E7800 Pure hypercholesterolemia, unspecified: Secondary | ICD-10-CM

## 2022-06-07 DIAGNOSIS — G4731 Primary central sleep apnea: Secondary | ICD-10-CM

## 2022-06-07 NOTE — Progress Notes (Signed)
Patient ID: Lonnie Courier., male   DOB: 11/18/1935, 86 y.o.   MRN: [id number]     Cardiology Office Note   Date:  06/07/2022   ID:  Lonnie Courier., DOB November 02, 1936, MRN [id number]  PCP:  No primary care provider on file.  Cardiologist:  Shelva Majestic, MD;  Sanda Klein, MD   Chief Complaint  Patient presents with   Pacemaker Check     History of Present Illness: Lonnie Yehle. is a 86 y.o. male who presents for CAD, SSS, 2nd deg AV block, pacemaker follow up  Lonnie Marshall has history of both sinus node dysfunction and second-degree atrioventricular block Mobitz type I. He received a pacemaker in 2013. He has a history of coronary disease: ST segment elevation myocardial infarction around the time of pacemaker implantation. He had total proximal LAD occlusion treated with overlapping stents 2.75x24 2.5x38 DES Xience and Promus. 3 days later he underwent staged intervention to the obtuse marginal branch of the circumflex vessel. In September 2013 a nuclear study showed distal LAD scar but otherwise significant myocardial salvage and other territories. Ejection fraction was 64%. No angina since his acute MI in 2013.  He does not have any cardiovascular complaints.  Feels well.Feels well. The patient specifically denies any chest pain at rest exertion, dyspnea at rest or with exertion, orthopnea, paroxysmal nocturnal dyspnea, syncope, palpitations, focal neurological deficits, intermittent claudication, lower extremity edema, unexplained weight gain, cough, hemoptysis or wheezing.  He has not had any falls, serious injuries or bleeding problems.  Has labs done at the [place] and will get [place] a copy.  He was told that his lipid profile was good.  He is on maximum dose atorvastatin.  Pacemaker interrogation shows normal device function, but the device is getting close to elective replacement indicator.  He was implanted in 2013 and has roughly 7 months remaining longevity.  All lead parameters  are good.  He is not pacemaker dependent.  His underlying rhythm today is sinus bradycardia 55 bpm with a long first-degree AV block.  He has 85% atrial pacing and 32% ventricular pacing.  Only 1 episode of mode switch has been recorded, a 4-second episode of paroxysmal atrial tachycardia.  There has been no atrial fibrillation or VT.  He is an Scientist, research (life sciences).  Past Medical History:  Diagnosis Date   Allergic rhinitis    Arthritis    BPH with obstruction/lower urinary tract symptoms    hx of TURP   Chronic renal insufficiency, stage 3 (moderate) (HCC)    GFR 50s   COPD (chronic obstructive pulmonary disease) ([place])    onset of sx's around 2013   Coronary artery disease    MI+stents 2013   Hearing loss, bilateral    hearing aid--it bothers him to have aid in R ear.   History of kidney stones    hx ureterolithiasis requiring procedure   History of lumbar surgery    Hypercholesterolemia    Hypertension    Hypothyroidism    Mobitz type 2 second degree atrioventricular block    has pacemaker   Overactive bladder    Pacemaker    Skull fracture ([place]) 1942   "fell out of car; in hospital for some time"   Sleep apnea    Central + obstructive->bipap (Dr. Claiborne Billings)   SSS (sick sinus syndrome) ([place])    has pacemaker--followed by Dr. Orene Desanctis   STEMI (ST elevation myocardial infarction), [place]  02/27/2012   Type II diabetes mellitus (  [place])     Past Surgical History:  Procedure Laterality Date   APPENDECTOMY     as part of R colectomy surgery for tubulovillous adenoma   CARDIAC CATHETERIZATION  02/27/12   "1"   CIRCUMCISION  1980's   COLONOSCOPY  2002, 2005, 2015   CORONARY ANGIOPLASTY WITH STENT PLACEMENT  03/01/12   "+2=3 total"   [place], URETEROSCOPY AND STENT PLACEMENT Left 07/13/2013   Procedure: CYSTOSCOPY WITH LEFT RETROGRADE PYELOGRAM, LEFT URETEROSCOPY AND left STENT PLACEMENT, cystogram;  Surgeon: Fredricka Bonine, MD;  [facility];   Service: Urology;  Laterality: Left;   CYSTOSCOPY/URETEROSCOPY/HOLMIUM LASER/STENT PLACEMENT Left 02/07/2018   Procedure: CYSTOSCOPY/LEFT RETROGRADE/LEFT URETEROSCOPY/HOLMIUM LASER LEFT /STENT PLACEMENT;  Surgeon: [name], MD;  [facility];  Service: Urology;  Laterality: Left;  ONLY NEEDS 60 MIN   INSERT / REPLACE / REMOVE PACEMAKER  03/17/12   "first one"   LEFT HEART CATHETERIZATION WITH CORONARY ANGIOGRAM N/A 02/27/2012   Procedure: LEFT HEART CATHETERIZATION WITH CORONARY ANGIOGRAM;  Surgeon: Troy Sine, MD;  Location: [facility] CATH LAB;  Service: Cardiovascular;  Laterality: N/A;   LUMBAR DISC SURGERY  ~ [place] N/A 02/14/2014   for large tubulovillous adenoma in cecum. Path showed high grade dysplasia but no evidence of carcinoma, neg nodes. Procedure: RIGHT COLECTOMY;  Surgeon: Earnstine Regal, MD;  [facility];  Service: General;  Laterality: N/A;   PERCUTANEOUS CORONARY STENT INTERVENTION (PCI-S) N/A 03/01/2012   Procedure: PERCUTANEOUS CORONARY STENT INTERVENTION (PCI-S);  Surgeon: Leonie Man, MD;  Location: [facility] CATH LAB;  Service: Cardiovascular;  Laterality: N/A;   PERMANENT PACEMAKER INSERTION N/A 03/17/2012   Procedure: PERMANENT PACEMAKER INSERTION;  Surgeon: Sanda Klein, MD;  Location: [place] CATH LAB;  Service: Cardiovascular;  Laterality: N/A;   TRANSURETHRAL RESECTION OF PROSTATE N/A 07/13/2013   Procedure: TRANSURETHRAL RESECTION OF THE PROSTATE (TURP);  Surgeon: Fredricka Bonine, MD;  [facility];  Service: Urology;  Laterality: N/A;   VASECTOMY  ~86 years old   same time as circumcision     Current Outpatient Medications  Medication Sig Dispense Refill   albuterol (PROVENTIL HFA;VENTOLIN HFA) 108 (90 Base) MCG/ACT inhaler Inhale 2 puffs into the lungs every 6 (six) hours as needed for wheezing or shortness of breath.     amLODipine (NORVASC) 5 MG tablet Take 1 tablet (5 mg total) by mouth daily. 90 tablet 3   aspirin EC 81 MG  tablet Take 1 tablet (81 mg total) by mouth every morning. (Patient taking differently: Take 81 mg by mouth every other day.)     atorvastatin (LIPITOR) 80 MG tablet Take 80 mg by mouth at bedtime.      cetirizine (ZYRTEC) 10 MG tablet Take 10 mg by mouth daily as needed for allergies.     cholecalciferol (VITAMIN D) 1000 UNITS tablet Take 1,000 Units by mouth daily.     clopidogrel (PLAVIX) 75 MG tablet Take 1 tablet (75 mg total) by mouth every morning.     finasteride (PROSCAR) 5 MG tablet Take 5 mg by mouth daily.     glipiZIDE (GLUCOTROL) 5 MG tablet Take by mouth.     isosorbide mononitrate (IMDUR) 30 MG 24 hr tablet Take 30 mg by mouth daily.     levothyroxine (SYNTHROID, LEVOTHROID) 25 MCG tablet Take 37.5 mcg by mouth daily before breakfast.   0   lisinopril-hydrochlorothiazide (PRINZIDE,ZESTORETIC) 10-12.5 MG tablet Take 2 tablets by mouth daily.  metFORMIN (GLUMETZA) 500 MG (MOD) 24 hr tablet Take 1,000 mg by mouth daily before supper.      metoprolol (LOPRESSOR) 50 MG tablet Take 25 mg by mouth 2 (two) times daily.     Omega-3 Fatty Acids (FISH OIL) 1000 MG CAPS Take 1,000 mg by mouth daily.      albuterol (ACCUNEB) 0.63 MG/3ML nebulizer solution Take 1 ampule by nebulization every 4 (four) hours. (Patient not taking: Reported on 06/07/2022)     No current facility-administered medications for this visit.    Allergies:   Other    Social History:  The patient  reports that he quit smoking about 47 years ago. His smoking use included cigarettes. He has a 60.00 pack-year smoking history. His smokeless tobacco use includes chew. He reports that he does not drink alcohol and does not use drugs.    ROS:  Please see the history of present illness.   All other systems are reviewed and are negative.   PHYSICAL EXAM: VS:  BP 124/62 (BP Location: Left Arm, Patient Position: Sitting, Cuff Size: Normal)   Pulse 60   Ht '5\' 10"'$  (1.778 m)   Wt 167 lb 9.6 oz (76 kg)   BMI 24.05 kg/m  ,  BMI Body mass index is 24.05 kg/m.   General: Alert, oriented x3, no distress, healthy left subclavian pacemaker site. Head: no evidence of trauma, PERRL, EOMI, no exophtalmos or lid lag, no myxedema, no xanthelasma; normal ears, nose and oropharynx Neck: normal jugular venous pulsations and no hepatojugular reflux; brisk carotid pulses without delay and no carotid bruits Chest: clear to auscultation, no signs of consolidation by percussion or palpation, normal fremitus, symmetrical and full respiratory excursions Cardiovascular: normal position and quality of the apical impulse, regular rhythm, normal first and second heart sounds, no murmurs, rubs or gallops Abdomen: no tenderness or distention, no masses by palpation, no abnormal pulsatility or arterial bruits, normal bowel sounds, no hepatosplenomegaly Extremities: no clubbing, cyanosis or edema; 2+ radial, ulnar and brachial pulses bilaterally; 2+ right femoral, posterior tibial and dorsalis pedis pulses; 2+ left femoral, posterior tibial and dorsalis pedis pulses; no subclavian or femoral bruits Neurological: grossly nonfocal except hard of hearing. Psych: Normal mood and affect      EKG:  EKG is ordered today.  It shows atrial paced, ventricular sensed rhythm and is otherwise a normal tracing.  QTc is 406 ms  Lipid Panel    Component Value Date/Time   CHOL 106 01/23/2021 0000   TRIG 82 01/23/2021 0000   HDL 60 01/23/2021 0000   CHOLHDL 4.1 02/28/2012 0857   VLDL 21 02/28/2012 0857   LDLCALC 107 (H) 02/28/2012 0857      Wt Readings from Last 3 Encounters:  06/07/22 167 lb 9.6 oz (76 kg)  05/21/21 173 lb 3.2 oz (78.6 kg)  04/23/21 173 lb 3.2 oz (78.6 kg)     1. SSS (sick sinus syndrome) ([place])   2. Second degree AV block   3. Pacemaker   4. Coronary artery disease involving native coronary artery of native heart without angina pectoris   5. Hypercholesterolemia   6. Acquired hypothyroidism   7. Syncope, vasovagal   8.  Complex sleep apnea syndrome   9. Essential hypertension       ASSESSMENT AND PLAN:  1. SSS: Heart rate histogram appears more appropriate after we turned on rate response a year ago. 2. Second degree AV block: Does have more ventricular pacing now, may be because he has  faster atrial paced rates.  No signs of congestive heart failure. 3. PPM: Normal device function.  No adjustments made to his settings today.  Will need generator change out early next year. 4. CAD: He has not had any new events since his initial presentation with myocardial infarction 11 years ago.  He is on dual antiplatelet therapy, maximum dose atorvastatin and 32 antianginal medications (beta-blocker, calcium channel blocker, long-acting nitrates 5. Hyperlipidemia: Target LDL less than 70.  Labs followed at [facility]. 6. Hypothyroidism: Appears clinically euthyroid.  Most recent TSH was in normal range. 7. Syncope: he had a single vasovagal event about 4 years ago.  It has not recurred. 8. Complex sleep apnea: Mixed obstructive and central, on nocturnal BiPAP, followed by Dr. Claiborne Billings.  Reports compliance with BiPAP and denies daytime hypersomnolence. 9. HTN: Blood pressure is well controlled.  He is on 4 different agents for control (amlodipine, lisinopril, hydrochlorothiazide, metoprolol).   Current medicines are reviewed at length with the patient today.  The patient does not have concerns regarding medicines.    Labs/ tests ordered today include:   Orders Placed This Encounter  Procedures   EKG 12-Lead    Patient Instructions  Medication Instructions:  No changes *If you need a refill on your cardiac medications before your next appointment, please call your pharmacy*   Lab Work: None ordered If you have labs (blood work) drawn today and your tests are completely normal, you will receive your results only by: [place] (if you have MyChart) OR A paper copy in the mail If you have any lab test  that is abnormal or we need to change your treatment, we will call you to review the results.   Testing/Procedures: None ordered   Follow-Up: At [facility], you and your health needs are our priority.  As part of our continuing mission to provide you with exceptional heart care, we have created designated Provider Care Teams.  These Care Teams include your primary Cardiologist (physician) and Advanced Practice Providers (APPs -  Physician Assistants and Nurse Practitioners) who all work together to provide you with the care you need, when you need it.  We recommend signing up for the patient portal called "MyChart".  Sign up information is provided on this After Visit Summary.  MyChart is used to connect with patients for Virtual Visits (Telemedicine).  Patients are able to view lab/test results, encounter notes, upcoming appointments, etc.  Non-urgent messages can be sent to your provider as well.   To learn more about what you can do with MyChart, go to [email or web address].    Your next appointment:   7 month(s)  The format for your next appointment:   In Person  Provider:   Sanda Klein, MD {  Important Information About Sugar          Signed, Sanda Klein, MD  06/07/2022 7:09 PM    Sanda Klein, MD, [facility] [phone] office [phone] pager

## 2022-06-07 NOTE — Patient Instructions (Signed)
Medication Instructions:  No changes *If you need a refill on your cardiac medications before your next appointment, please call your pharmacy*   Lab Work: None ordered If you have labs (blood work) drawn today and your tests are completely normal, you will receive your results only by: [place] (if you have MyChart) OR A paper copy in the mail If you have any lab test that is abnormal or we need to change your treatment, we will call you to review the results.   Testing/Procedures: None ordered   Follow-Up: At [facility], you and your health needs are our priority.  As part of our continuing mission to provide you with exceptional heart care, we have created designated Provider Care Teams.  These Care Teams include your primary Cardiologist (physician) and Advanced Practice Providers (APPs -  Physician Assistants and Nurse Practitioners) who all work together to provide you with the care you need, when you need it.  We recommend signing up for the patient portal called "MyChart".  Sign up information is provided on this After Visit Summary.  MyChart is used to connect with patients for Virtual Visits (Telemedicine).  Patients are able to view lab/test results, encounter notes, upcoming appointments, etc.  Non-urgent messages can be sent to your provider as well.   To learn more about what you can do with MyChart, go to [email or web address].    Your next appointment:   7 month(s)  The format for your next appointment:   In Person  Provider:   Sanda Klein, MD {  Important Information About Sugar

## 2022-06-30 ENCOUNTER — Ambulatory Visit (INDEPENDENT_AMBULATORY_CARE_PROVIDER_SITE_OTHER): Payer: Medicare Other

## 2022-06-30 DIAGNOSIS — I495 Sick sinus syndrome: Secondary | ICD-10-CM | POA: Diagnosis not present

## 2022-07-01 LAB — CUP PACEART REMOTE DEVICE CHECK
Battery Remaining Longevity: 7 mo
Battery Remaining Percentage: 7 %
Battery Voltage: 2.71 V
Brady Statistic AP VP Percent: 39 %
Brady Statistic AP VS Percent: 48 %
Brady Statistic AS VP Percent: 1 %
Brady Statistic AS VS Percent: 13 %
Brady Statistic RA Percent Paced: 86 %
Brady Statistic RV Percent Paced: 39 %
Date Time Interrogation Session: 20230823035229
Implantable Lead Implant Date: 20130510
Implantable Lead Implant Date: 20130510
Implantable Lead Location: 753859
Implantable Lead Location: 753860
Implantable Pulse Generator Implant Date: 20130510
Lead Channel Impedance Value: 360 Ohm
Lead Channel Impedance Value: 450 Ohm
Lead Channel Pacing Threshold Amplitude: 1 V
Lead Channel Pacing Threshold Amplitude: 1.25 V
Lead Channel Pacing Threshold Pulse Width: 0.5 ms
Lead Channel Pacing Threshold Pulse Width: 0.6 ms
Lead Channel Sensing Intrinsic Amplitude: 11.5 mV
Lead Channel Sensing Intrinsic Amplitude: 3.4 mV
Lead Channel Setting Pacing Amplitude: 2.5 V
Lead Channel Setting Pacing Amplitude: 2.5 V
Lead Channel Setting Pacing Pulse Width: 0.6 ms
Lead Channel Setting Sensing Sensitivity: 2 mV
Pulse Gen Model: 2210
Pulse Gen Serial Number: 7336473

## 2022-07-27 NOTE — Progress Notes (Signed)
Remote pacemaker transmission.   

## 2022-09-29 ENCOUNTER — Ambulatory Visit (INDEPENDENT_AMBULATORY_CARE_PROVIDER_SITE_OTHER): Payer: Medicare Other

## 2022-09-29 ENCOUNTER — Telehealth: Payer: Self-pay

## 2022-09-29 DIAGNOSIS — I495 Sick sinus syndrome: Secondary | ICD-10-CM

## 2022-09-29 LAB — CUP PACEART REMOTE DEVICE CHECK
Battery Remaining Longevity: 4 mo
Battery Remaining Percentage: 4 %
Battery Voltage: 2.66 V
Brady Statistic AP VP Percent: 38 %
Brady Statistic AP VS Percent: 49 %
Brady Statistic AS VP Percent: 1 %
Brady Statistic AS VS Percent: 13 %
Brady Statistic RA Percent Paced: 86 %
Brady Statistic RV Percent Paced: 38 %
Date Time Interrogation Session: 20231122032356
Implantable Lead Connection Status: 753985
Implantable Lead Connection Status: 753985
Implantable Lead Implant Date: 20130510
Implantable Lead Implant Date: 20130510
Implantable Lead Location: 753859
Implantable Lead Location: 753860
Implantable Pulse Generator Implant Date: 20130510
Lead Channel Impedance Value: 360 Ohm
Lead Channel Impedance Value: 490 Ohm
Lead Channel Pacing Threshold Amplitude: 1 V
Lead Channel Pacing Threshold Amplitude: 1.25 V
Lead Channel Pacing Threshold Pulse Width: 0.5 ms
Lead Channel Pacing Threshold Pulse Width: 0.6 ms
Lead Channel Sensing Intrinsic Amplitude: 12 mV
Lead Channel Sensing Intrinsic Amplitude: 4.3 mV
Lead Channel Setting Pacing Amplitude: 2.5 V
Lead Channel Setting Pacing Amplitude: 2.5 V
Lead Channel Setting Pacing Pulse Width: 0.6 ms
Lead Channel Setting Sensing Sensitivity: 2 mV
Pulse Gen Model: 2210
Pulse Gen Serial Number: 7336473

## 2022-09-29 NOTE — Telephone Encounter (Signed)
Scheduled remote reviewed. Normal device function.   1 AMS, 10sec in duration Battery estimated 4.69mo- route to triage Next remote to be determined  Outreach made to Pt-spoke with wife.  Advised of 4 months left on battery.  Advised of monthly battery checks scheduled.  Follow up scheduled with Dr. CLoletha Grayer  All questions answered.

## 2022-10-20 NOTE — Progress Notes (Unsigned)
Remote pacemaker transmission.   

## 2022-11-29 ENCOUNTER — Encounter: Payer: Self-pay | Admitting: Cardiovascular Disease

## 2022-11-29 ENCOUNTER — Ambulatory Visit (INDEPENDENT_AMBULATORY_CARE_PROVIDER_SITE_OTHER): Payer: Medicare Other | Admitting: Cardiovascular Disease

## 2022-11-29 ENCOUNTER — Ambulatory Visit: Payer: Medicare Other | Attending: Cardiovascular Disease

## 2022-11-29 VITALS — BP 114/62 | HR 60 | Ht 70.0 in | Wt 164.2 lb

## 2022-11-29 DIAGNOSIS — I2510 Atherosclerotic heart disease of native coronary artery without angina pectoris: Secondary | ICD-10-CM

## 2022-11-29 DIAGNOSIS — I441 Atrioventricular block, second degree: Secondary | ICD-10-CM | POA: Diagnosis not present

## 2022-11-29 DIAGNOSIS — Z950 Presence of cardiac pacemaker: Secondary | ICD-10-CM | POA: Diagnosis not present

## 2022-11-29 DIAGNOSIS — I495 Sick sinus syndrome: Secondary | ICD-10-CM

## 2022-11-29 DIAGNOSIS — E7800 Pure hypercholesterolemia, unspecified: Secondary | ICD-10-CM

## 2022-11-29 NOTE — Patient Instructions (Signed)
Medication Instructions:  No changes *If you need a refill on your cardiac medications before your next appointment, please call your pharmacy*  Follow-Up: At  [facility]Care, you and your health needs are our priority.  As part of our continuing mission to provide you with exceptional heart care, we have created designated Provider Care Teams.  These Care Teams include your primary Cardiologist (physician) and Advanced Practice Providers (APPs -  Physician Assistants and Nurse Practitioners) who all work together to provide you with the care you need, when you need it.  We recommend signing up for the patient portal called "MyChart".  Sign up information is provided on this After Visit Summary.  MyChart is used to connect with patients for Virtual Visits (Telemedicine).  Patients are able to view lab/test results, encounter notes, upcoming appointments, etc.  Non-urgent messages can be sent to your provider as well.   To learn more about what you can do with MyChart, go to [email or web address].    Your next appointment:   6 month(s)  Provider:   Mihai Croitoru, MD      

## 2022-11-29 NOTE — Progress Notes (Unsigned)
Patient ID: Lonnie Courier., male   DOB: 1936/01/05, 87 y.o.   MRN: [id number]     Cardiology Office Note   Date:  12/02/2022   ID:  Lonnie Courier., DOB Apr 03, 1936, MRN [id number]  PCP:  Doyle Askew, PA-C  Cardiologist:  Shelva Majestic, MD;  Sanda Klein, MD   Chief Complaint  Patient presents with   Pacemaker Check     History of Present Illness: Lonnie Dunnigan. is a 87 y.o. male who presents for CAD, SSS, 2nd deg AV block, pacemaker follow up  Lonnie Marshall has history of both sinus node dysfunction and second-degree atrioventricular block Mobitz type I. He received a pacemaker in 2013. He has a history of coronary disease: ST segment elevation myocardial infarction around the time of pacemaker implantation. He had total proximal LAD occlusion treated with overlapping stents 2.75x24 2.5x38 DES Xience and Promus. 3 days later he underwent staged intervention to the obtuse marginal branch of the circumflex vessel. In September 2013 a nuclear study showed distal LAD scar but otherwise significant myocardial salvage and other territories. Ejection fraction was 64%. No angina since his acute MI in 2013.  He is doing well and does not have any cardiovascular complaints.The patient specifically denies any chest pain at rest exertion, dyspnea at rest or with exertion, orthopnea, paroxysmal nocturnal dyspnea, syncope, palpitations, focal neurological deficits, intermittent claudication, lower extremity edema, unexplained weight gain, cough, hemoptysis or wheezing. He denies any falls or serious bleeding problems.  He is on maximum dose atorvastatin and reports that labs performed at the [facility] last year were all good.  Device interrogation shows that his pacemaker is functioning normally, but it is expected to reach ERI in less than 3 months.  Current battery voltage is 2.63 V (ERI 2.60 V) he is not pacemaker dependent.  Underlying rhythm is sinus bradycardia at just under 50  bpm.  He has 87% atrial pacing and 37% ventricular pacing.  He has not had any atrial fibrillation and his device has recorded just 1 episode of atrial mode switch that lasted for 10 seconds and is consistent with ectopic atrial tachycardia.  Presenting rhythm today is atrial paced, ventricular sensed.  He is an Scientist, research (life sciences).  Past Medical History:  Diagnosis Date   Allergic rhinitis    Arthritis    BPH with obstruction/lower urinary tract symptoms    hx of TURP   Chronic renal insufficiency, stage 3 (moderate) (HCC)    GFR 50s   COPD (chronic obstructive pulmonary disease) ([place])    onset of sx's around 2013   Coronary artery disease    MI+stents 2013   Hearing loss, bilateral    hearing aid--it bothers him to have aid in R ear.   History of kidney stones    hx ureterolithiasis requiring procedure   History of lumbar surgery    Hypercholesterolemia    Hypertension    Hypothyroidism    Mobitz type 2 second degree atrioventricular block    has pacemaker   Overactive bladder    Pacemaker    Skull fracture ([place]) 1942   "fell out of car; in hospital for some time"   Sleep apnea    Central + obstructive->bipap (Dr. Claiborne Billings)   SSS (sick sinus syndrome) ([place])    has pacemaker--followed by Dr. Orene Desanctis   STEMI (ST elevation myocardial infarction), [place]  02/27/2012   Type II diabetes mellitus ([place])     Past Surgical History:  Procedure Laterality  Date   APPENDECTOMY     as part of R colectomy surgery for tubulovillous adenoma   CARDIAC CATHETERIZATION  02/27/12   "1"   CIRCUMCISION  1980's   COLONOSCOPY  2002, 2005, 2015   CORONARY ANGIOPLASTY WITH STENT PLACEMENT  03/01/12   "+2=3 total"   CYSTOSCOPY WITH RETROGRADE PYELOGRAM, URETEROSCOPY AND STENT PLACEMENT Left 07/13/2013   Procedure: CYSTOSCOPY WITH LEFT RETROGRADE PYELOGRAM, LEFT URETEROSCOPY AND left STENT PLACEMENT, cystogram;  Surgeon: Fredricka Bonine, MD;  Location: [facility];  Service: Urology;  Laterality: Left;    CYSTOSCOPY/URETEROSCOPY/HOLMIUM LASER/STENT PLACEMENT Left 02/07/2018   Procedure: CYSTOSCOPY/LEFT RETROGRADE/LEFT URETEROSCOPY/HOLMIUM LASER LEFT /STENT PLACEMENT;  Surgeon: [name], MD;  Location: [facility];  Service: Urology;  Laterality: Left;  ONLY NEEDS 60 MIN   INSERT / REPLACE / REMOVE PACEMAKER  03/17/12   "first one"   LEFT HEART CATHETERIZATION WITH CORONARY ANGIOGRAM N/A 02/27/2012   Procedure: LEFT HEART CATHETERIZATION WITH CORONARY ANGIOGRAM;  Surgeon: Troy Sine, MD;  Location: [facility] CATH LAB;  Service: Cardiovascular;  Laterality: N/A;   LUMBAR DISC SURGERY  ~ [place] N/A 02/14/2014   for large tubulovillous adenoma in cecum. Path showed high grade dysplasia but no evidence of carcinoma, neg nodes. Procedure: RIGHT COLECTOMY;  Surgeon: Earnstine Regal, MD;  Location: [facility];  Service: General;  Laterality: N/A;   PERCUTANEOUS CORONARY STENT INTERVENTION (PCI-S) N/A 03/01/2012   Procedure: PERCUTANEOUS CORONARY STENT INTERVENTION (PCI-S);  Surgeon: Leonie Man, MD;  Location: [facility] CATH LAB;  Service: Cardiovascular;  Laterality: N/A;   PERMANENT PACEMAKER INSERTION N/A 03/17/2012   Procedure: PERMANENT PACEMAKER INSERTION;  Surgeon: Sanda Klein, MD;  Location: [place] CATH LAB;  Service: Cardiovascular;  Laterality: N/A;   TRANSURETHRAL RESECTION OF PROSTATE N/A 07/13/2013   Procedure: TRANSURETHRAL RESECTION OF THE PROSTATE (TURP);  Surgeon: Fredricka Bonine, MD;  Location: [facility];  Service: Urology;  Laterality: N/A;   VASECTOMY  ~87 years old   same time as circumcision     Current Outpatient Medications  Medication Sig Dispense Refill   albuterol (ACCUNEB) 0.63 MG/3ML nebulizer solution Take 1 ampule by nebulization every 4 (four) hours.     albuterol (PROVENTIL HFA;VENTOLIN HFA) 108 (90 Base) MCG/ACT inhaler Inhale 2 puffs into the lungs every 6 (six) hours as needed for wheezing or shortness of breath.     amLODipine (NORVASC) 5 MG tablet Take 1  tablet (5 mg total) by mouth daily. 90 tablet 3   aspirin EC 81 MG tablet Take 1 tablet (81 mg total) by mouth every morning. (Patient taking differently: Take 81 mg by mouth every other day.)     atorvastatin (LIPITOR) 80 MG tablet Take 80 mg by mouth at bedtime.      cetirizine (ZYRTEC) 10 MG tablet Take 10 mg by mouth daily as needed for allergies.     cholecalciferol (VITAMIN D) 1000 UNITS tablet Take 1,000 Units by mouth daily.     clopidogrel (PLAVIX) 75 MG tablet Take 1 tablet (75 mg total) by mouth every morning.     finasteride (PROSCAR) 5 MG tablet Take 5 mg by mouth daily.     glipiZIDE (GLUCOTROL) 5 MG tablet Take by mouth.     isosorbide mononitrate (IMDUR) 30 MG 24 hr tablet Take 30 mg by mouth daily.     levothyroxine (SYNTHROID, LEVOTHROID) 25 MCG tablet Take 37.5 mcg by mouth daily before breakfast.   0   lisinopril-hydrochlorothiazide (PRINZIDE,ZESTORETIC) 10-12.5 MG tablet Take 2  tablets by mouth daily.     metFORMIN (GLUMETZA) 500 MG (MOD) 24 hr tablet Take 1,000 mg by mouth daily before supper.      metoprolol (LOPRESSOR) 50 MG tablet Take 25 mg by mouth 2 (two) times daily.     Omega-3 Fatty Acids (FISH OIL) 1000 MG CAPS Take 1,000 mg by mouth daily.      No current facility-administered medications for this visit.    Allergies:   Other    Social History:  The patient  reports that he quit smoking about 47 years ago. His smoking use included cigarettes. He has a 60.00 pack-year smoking history. His smokeless tobacco use includes chew. He reports that he does not drink alcohol and does not use drugs.    ROS:  Please see the history of present illness.   All other systems are reviewed and are negative.   PHYSICAL EXAM: VS:  BP 114/62   Pulse 60   Ht '5\' 10"'$  (1.778 m)   Wt 164 lb 3.2 oz (74.5 kg)   SpO2 95%   BMI 23.56 kg/m  , BMI Body mass index is 23.56 kg/m.   General: Alert, oriented x3, no distress, healthy left subclavian pacemaker site. Head: no evidence  of trauma, PERRL, EOMI, no exophtalmos or lid lag, no myxedema, no xanthelasma; normal ears, nose and oropharynx Neck: normal jugular venous pulsations and no hepatojugular reflux; brisk carotid pulses without delay and no carotid bruits Chest: clear to auscultation, no signs of consolidation by percussion or palpation, normal fremitus, symmetrical and full respiratory excursions Cardiovascular: normal position and quality of the apical impulse, regular rhythm, normal first and second heart sounds, no murmurs, rubs or gallops Abdomen: no tenderness or distention, no masses by palpation, no abnormal pulsatility or arterial bruits, normal bowel sounds, no hepatosplenomegaly Extremities: no clubbing, cyanosis or edema; 2+ radial, ulnar and brachial pulses bilaterally; 2+ right femoral, posterior tibial and dorsalis pedis pulses; 2+ left femoral, posterior tibial and dorsalis pedis pulses; no subclavian or femoral bruits Neurological: grossly nonfocal Psych: Normal mood and affect   EKG:  EKG is ordered today.  Shows atrial paced, ventricular sensed rhythm and is a normal tracing.  Lipid Panel    Component Value Date/Time   CHOL 106 01/23/2021 0000   TRIG 82 01/23/2021 0000   HDL 60 01/23/2021 0000   CHOLHDL 4.1 02/28/2012 0857   VLDL 21 02/28/2012 0857   LDLCALC 107 (H) 02/28/2012 0857      Wt Readings from Last 3 Encounters:  11/29/22 164 lb 3.2 oz (74.5 kg)  06/07/22 167 lb 9.6 oz (76 kg)  05/21/21 173 lb 3.2 oz (78.6 kg)     1. SSS (sick sinus syndrome) ([place])   2. Second degree Mobitz type II incomplete atrioventricular block   3. Pacemaker   4. Coronary artery disease involving native coronary artery of native heart without angina pectoris   5. Hypercholesterolemia      ASSESSMENT AND PLAN:  1. SSS: Heart rate histogram distribution appears appropriate with the current sensor settings (we turned on rate response about 2 years ago). 2. Second degree AV block: He does have  almost 40% ventricular pacing, but thankfully has not had any signs of heart failure. 3. PPM: Device is approaching recommended replacement trigger.  We discussed the pacemaker generator change out in great detail today. This procedure has been fully reviewed with the patient and informed consent has been obtained.  He is not device dependent. 4. CAD: asymptomatic.  e has not had any new events since his initial presentation with myocardial infarction 12 years ago.  He is on dual antiplatelet therapy, maximum dose atorvastatin and 32 antianginal medications (beta-blocker, calcium channel blocker, long-acting nitrates. 5. Hypercholesterolemia: Most recent labs show that all his lipid parameters were in target range.  [facility].  I 6. Hypothyroidism: Appears clinically euthyroid.  Most recent TSH was in normal range. 7. Syncope: he had a single vasovagal event about 4 years ago.  It has not recurred. 8. Complex sleep apnea: Mixed obstructive and central, on nocturnal BiPAP, followed by Dr. Claiborne Billings.  Reports compliance with BiPAP and denies daytime hypersomnolence. 9. HTN: Blood pressure is well controlled.  He is on 4 different agents for control (amlodipine, lisinopril, hydrochlorothiazide, metoprolol).  Patient Instructions  Medication Instructions:  No changes *If you need a refill on your cardiac medications before your next appointment, please call your pharmacy*   Follow-Up: At [facility], you and your health needs are our priority.  As part of our continuing mission to provide you with exceptional heart care, we have created designated Provider Care Teams.  These Care Teams include your primary Cardiologist (physician) and Advanced Practice Providers (APPs -  Physician Assistants and Nurse Practitioners) who all work together to provide you with the care you need, when you need it.  We recommend signing up for the patient portal called "MyChart".  Sign  up information is provided on this After Visit Summary.  MyChart is used to connect with patients for Virtual Visits (Telemedicine).  Patients are able to view lab/test results, encounter notes, upcoming appointments, etc.  Non-urgent messages can be sent to your provider as well.   To learn more about what you can do with MyChart, go to [email or web address].    Your next appointment:   6 month(s)  Provider:   Sanda Klein, MD        Signed, Sanda Klein, MD  12/02/2022 7:24 PM    Sanda Klein, MD, [facility] [phone] office [phone] pager

## 2022-12-01 LAB — CUP PACEART REMOTE DEVICE CHECK
Battery Remaining Longevity: 1 mo
Battery Remaining Percentage: 1 %
Battery Voltage: 2.63 V
Brady Statistic AP VP Percent: 37 %
Brady Statistic AP VS Percent: 51 %
Brady Statistic AS VP Percent: 1 %
Brady Statistic AS VS Percent: 12 %
Brady Statistic RA Percent Paced: 87 %
Brady Statistic RV Percent Paced: 37 %
Date Time Interrogation Session: 20240123103237
Implantable Lead Connection Status: 753985
Implantable Lead Connection Status: 753985
Implantable Lead Implant Date: 20130510
Implantable Lead Implant Date: 20130510
Implantable Lead Location: 753859
Implantable Lead Location: 753860
Implantable Pulse Generator Implant Date: 20130510
Lead Channel Impedance Value: 360 Ohm
Lead Channel Impedance Value: 430 Ohm
Lead Channel Pacing Threshold Amplitude: 0.75 V
Lead Channel Pacing Threshold Amplitude: 1 V
Lead Channel Pacing Threshold Pulse Width: 0.5 ms
Lead Channel Pacing Threshold Pulse Width: 0.6 ms
Lead Channel Sensing Intrinsic Amplitude: 12 mV
Lead Channel Sensing Intrinsic Amplitude: 3.6 mV
Lead Channel Setting Pacing Amplitude: 2.5 V
Lead Channel Setting Pacing Amplitude: 2.5 V
Lead Channel Setting Pacing Pulse Width: 0.6 ms
Lead Channel Setting Sensing Sensitivity: 2 mV
Pulse Gen Model: 2210
Pulse Gen Serial Number: 7336473

## 2022-12-29 ENCOUNTER — Ambulatory Visit (INDEPENDENT_AMBULATORY_CARE_PROVIDER_SITE_OTHER): Payer: Medicare Other

## 2022-12-29 DIAGNOSIS — I495 Sick sinus syndrome: Secondary | ICD-10-CM

## 2022-12-29 LAB — CUP PACEART REMOTE DEVICE CHECK
Battery Remaining Longevity: 1 mo
Battery Remaining Percentage: 0.5 %
Battery Voltage: 2.62 V
Brady Statistic AP VP Percent: 35 %
Brady Statistic AP VS Percent: 53 %
Brady Statistic AS VP Percent: 1 %
Brady Statistic AS VS Percent: 12 %
Brady Statistic RA Percent Paced: 87 %
Brady Statistic RV Percent Paced: 35 %
Date Time Interrogation Session: 20240221035553
Implantable Lead Connection Status: 753985
Implantable Lead Connection Status: 753985
Implantable Lead Implant Date: 20130510
Implantable Lead Implant Date: 20130510
Implantable Lead Location: 753859
Implantable Lead Location: 753860
Implantable Pulse Generator Implant Date: 20130510
Lead Channel Impedance Value: 360 Ohm
Lead Channel Impedance Value: 430 Ohm
Lead Channel Pacing Threshold Amplitude: 0.75 V
Lead Channel Pacing Threshold Amplitude: 1 V
Lead Channel Pacing Threshold Pulse Width: 0.5 ms
Lead Channel Pacing Threshold Pulse Width: 0.6 ms
Lead Channel Sensing Intrinsic Amplitude: 12 mV
Lead Channel Sensing Intrinsic Amplitude: 4.7 mV
Lead Channel Setting Pacing Amplitude: 2.5 V
Lead Channel Setting Pacing Amplitude: 2.5 V
Lead Channel Setting Pacing Pulse Width: 0.6 ms
Lead Channel Setting Sensing Sensitivity: 2 mV
Pulse Gen Model: 2210
Pulse Gen Serial Number: 7336473

## 2023-01-17 NOTE — Addendum Note (Signed)
Addended by: Douglass Rivers D on: 01/17/2023 12:28 PM   Modules accepted: Level of Service

## 2023-01-17 NOTE — Progress Notes (Signed)
Remote pacemaker transmission.   

## 2023-01-26 NOTE — Progress Notes (Signed)
Remote pacemaker transmission.   

## 2023-01-28 ENCOUNTER — Telehealth: Payer: Self-pay

## 2023-01-28 ENCOUNTER — Ambulatory Visit (INDEPENDENT_AMBULATORY_CARE_PROVIDER_SITE_OTHER): Payer: Medicare Other

## 2023-01-28 DIAGNOSIS — I495 Sick sinus syndrome: Secondary | ICD-10-CM

## 2023-01-28 LAB — CUP PACEART REMOTE DEVICE CHECK
Battery Remaining Longevity: 1 mo
Battery Remaining Percentage: 0.5 %
Battery Voltage: 2.59 V
Brady Statistic AP VP Percent: 35 %
Brady Statistic AP VS Percent: 53 %
Brady Statistic AS VP Percent: 1 %
Brady Statistic AS VS Percent: 12 %
Brady Statistic RA Percent Paced: 87 %
Brady Statistic RV Percent Paced: 35 %
Date Time Interrogation Session: 20240322030534
Implantable Lead Connection Status: 753985
Implantable Lead Connection Status: 753985
Implantable Lead Implant Date: 20130510
Implantable Lead Implant Date: 20130510
Implantable Lead Location: 753859
Implantable Lead Location: 753860
Implantable Pulse Generator Implant Date: 20130510
Lead Channel Impedance Value: 360 Ohm
Lead Channel Impedance Value: 430 Ohm
Lead Channel Pacing Threshold Amplitude: 0.75 V
Lead Channel Pacing Threshold Amplitude: 1 V
Lead Channel Pacing Threshold Pulse Width: 0.5 ms
Lead Channel Pacing Threshold Pulse Width: 0.6 ms
Lead Channel Sensing Intrinsic Amplitude: 12 mV
Lead Channel Sensing Intrinsic Amplitude: 4.8 mV
Lead Channel Setting Pacing Amplitude: 2.5 V
Lead Channel Setting Pacing Amplitude: 2.5 V
Lead Channel Setting Pacing Pulse Width: 0.6 ms
Lead Channel Setting Sensing Sensitivity: 2 mV
Pulse Gen Model: 2210
Pulse Gen Serial Number: 7336473

## 2023-01-28 NOTE — Telephone Encounter (Signed)
Thanks. Can we please schedule for first available gen change day and before that, a phone or video visit with me (will need updated H/P for gen change).

## 2023-01-28 NOTE — Telephone Encounter (Signed)
Pacemaker battery voltage at 2.59V ( ERI 2.60V)  Likely by the time patient is scheduled for gen change will have tripped ERI.  Per [facility] last note 11/29/22 procedure has been reviewed and informed consent

## 2023-01-31 ENCOUNTER — Telehealth: Payer: Self-pay | Admitting: Emergency Medicine

## 2023-01-31 NOTE — Telephone Encounter (Signed)
Called and spoke with Izora Gala, pt's wife. Told of scheduled appt 02/17/23 at 0920- where instructions will be given for pacemaker change out and blood work drawn. 02/24/23- Pacemaker change out- need to be at hospital at 1130- procedure start at 1:30.  She verbalized understanding

## 2023-02-17 ENCOUNTER — Ambulatory Visit: Payer: Medicare Other | Attending: Cardiovascular Disease | Admitting: Cardiovascular Disease

## 2023-02-17 ENCOUNTER — Encounter: Payer: Self-pay | Admitting: Cardiovascular Disease

## 2023-02-17 VITALS — BP 116/58 | HR 60 | Ht 70.0 in | Wt 162.8 lb

## 2023-02-17 DIAGNOSIS — Z45010 Encounter for checking and testing of cardiac pacemaker pulse generator [battery]: Secondary | ICD-10-CM | POA: Diagnosis not present

## 2023-02-17 DIAGNOSIS — E7800 Pure hypercholesterolemia, unspecified: Secondary | ICD-10-CM

## 2023-02-17 DIAGNOSIS — R55 Syncope and collapse: Secondary | ICD-10-CM

## 2023-02-17 DIAGNOSIS — I2510 Atherosclerotic heart disease of native coronary artery without angina pectoris: Secondary | ICD-10-CM | POA: Diagnosis not present

## 2023-02-17 DIAGNOSIS — E039 Hypothyroidism, unspecified: Secondary | ICD-10-CM

## 2023-02-17 DIAGNOSIS — I495 Sick sinus syndrome: Secondary | ICD-10-CM

## 2023-02-17 DIAGNOSIS — I10 Essential (primary) hypertension: Secondary | ICD-10-CM

## 2023-02-17 DIAGNOSIS — G4731 Primary central sleep apnea: Secondary | ICD-10-CM

## 2023-02-17 DIAGNOSIS — I441 Atrioventricular block, second degree: Secondary | ICD-10-CM | POA: Diagnosis not present

## 2023-02-17 NOTE — H&P (View-Only) (Signed)
Patient ID: Lonnie ShortEdward M Copeman Jr., male   DOB: 07-Sep-1936, 87 y.o.   MRN: [id number]     Cardiology Office Note   Date:  02/17/2023   ID:  Lonnie ShortEdward M Saliba Jr., DOB 07-Sep-1936, MRN [id number]  PCP:  Rick DuffMichaels, Ashley L, PA-C  Cardiologist:  Nicki Guadalajarahomas Kelly, MD;  Thurmon FairMihai Maridee Slape, MD   Chief Complaint  Patient presents with   Pacemaker Check     History of Present Illness: Lonnie Shortdward M Vecchio Jr. is a 87 y.o. male who presents for CAD, SSS, 2nd deg AV block, pacemaker follow up  [name] has history of both sinus node dysfunction and second-degree atrioventricular block Mobitz type I. He received a pacemaker in 2013. He has a history of coronary disease: ST segment elevation myocardial infarction around the time of pacemaker implantation. He had total proximal LAD occlusion treated with overlapping stents 2.75x24 2.5x38 DES Xience and Promus. 3 days later he underwent staged intervention to the obtuse marginal branch of the circumflex vessel. In September 2013 a nuclear study showed distal LAD scar but otherwise significant myocardial salvage and other territories. Ejection fraction was 64%. No angina since his acute MI in 2013.  The patient specifically denies any chest pain at rest exertion, dyspnea at rest or with exertion, orthopnea, paroxysmal nocturnal dyspnea, syncope, palpitations, focal neurological deficits, intermittent claudication, lower extremity edema, unexplained weight gain, cough, hemoptysis or wheezing.  He has not had any falls or bleeding problems.  He is on chronic dual antiplatelet therapy with aspirin and clopidogrel.  His pacemaker is at elective replacement interval with a battery voltage of 2.59 V.  He is not pacemaker dependent but has roughly 88% atrial pacing and 30% ventricular pacing.  His device has recorded single 10-second episode of very brief paroxysmal atrial tachycardia.  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  [facility]id [facility], checked 2 weeks ago.  He has  not heard from them yet.  Past Medical History:  Diagnosis Date   Allergic rhinitis    Arthritis    BPH with obstruction/lower urinary tract symptoms    hx of TURP   Chronic renal insufficiency, stage 3 (moderate)    GFR 50s   COPD (chronic obstructive pulmonary disease)    onset of sx's around 2013   Coronary artery disease    MI+stents 2013   Hearing loss, bilateral    hearing aid--it bothers him to have aid in R ear.   History of kidney stones    hx ureterolithiasis requiring procedure   History of lumbar surgery    Hypercholesterolemia    Hypertension    Hypothyroidism    Mobitz type 2 second degree atrioventricular block    has pacemaker   Overactive bladder    Pacemaker    Skull fracture 1942   "fell out of car; in hospital for some time"   Sleep apnea    Central + obstructive->bipap (Dr. Tresa EndoKelly)   SSS (sick sinus syndrome)    has pacemaker--followed by Dr. Jomarie Longsroituru   STEMI (ST elevation myocardial infarction), [place]  02/27/2012   Type II diabetes mellitus     Past Surgical History:  Procedure Laterality Date   APPENDECTOMY     as part of R colectomy surgery for tubulovillous adenoma   CARDIAC CATHETERIZATION  02/27/12   "1"   CIRCUMCISION  1980's   COLONOSCOPY  2002, 2005, 2015   CORONARY ANGIOPLASTY WITH STENT PLACEMENT  03/01/12   "+2=3 total"   CYSTOSCOPY WITH RETROGRADE  PYELOGRAM, URETEROSCOPY AND STENT PLACEMENT Left 07/13/2013   Procedure: CYSTOSCOPY WITH LEFT RETROGRADE PYELOGRAM, LEFT URETEROSCOPY AND left STENT PLACEMENT, cystogram;  Surgeon: Antony Haste, MD;  Location: [facility];  Service: Urology;  Laterality: Left;   CYSTOSCOPY/URETEROSCOPY/HOLMIUM LASER/STENT PLACEMENT Left 02/07/2018   Procedure: CYSTOSCOPY/LEFT RETROGRADE/LEFT URETEROSCOPY/HOLMIUM LASER LEFT /STENT PLACEMENT;  Surgeon: Jerilee Field, MD;  Location: [facility];  Service: Urology;  Laterality: Left;  ONLY NEEDS 60 MIN   INSERT / REPLACE / REMOVE PACEMAKER  03/17/12   "first  one"   LEFT HEART CATHETERIZATION WITH CORONARY ANGIOGRAM N/A 02/27/2012   Procedure: LEFT HEART CATHETERIZATION WITH CORONARY ANGIOGRAM;  Surgeon: Lennette Bihari, MD;  Location: [facility] CATH LAB;  Service: Cardiovascular;  Laterality: N/A;   LUMBAR DISC SURGERY  ~ 1985   PARTIAL COLECTOMY N/A 02/14/2014   for large tubulovillous adenoma in cecum. Path showed high grade dysplasia but no evidence of carcinoma, neg nodes. Procedure: RIGHT COLECTOMY;  Surgeon: Velora Heckler, MD;  Location: [facility];  Service: General;  Laterality: N/A;   PERCUTANEOUS CORONARY STENT INTERVENTION (PCI-S) N/A 03/01/2012   Procedure: PERCUTANEOUS CORONARY STENT INTERVENTION (PCI-S);  Surgeon: Marykay Lex, MD;  Location: [facility] CATH LAB;  Service: Cardiovascular;  Laterality: N/A;   PERMANENT PACEMAKER INSERTION N/A 03/17/2012   Procedure: PERMANENT PACEMAKER INSERTION;  Surgeon: Thurmon Fair, MD;  Location: [facility] CATH LAB;  Service: Cardiovascular;  Laterality: N/A;   TRANSURETHRAL RESECTION OF PROSTATE N/A 07/13/2013   Procedure: TRANSURETHRAL RESECTION OF THE PROSTATE (TURP);  Surgeon: Antony Haste, MD;  Location: [facility];  Service: Urology;  Laterality: N/A;   VASECTOMY  ~87 years old   same time as circumcision     Current Outpatient Medications  Medication Sig Dispense Refill   albuterol (PROVENTIL HFA;VENTOLIN HFA) 108 (90 Base) MCG/ACT inhaler Inhale 2 puffs into the lungs every 6 (six) hours as needed for wheezing or shortness of breath.     amLODipine (NORVASC) 5 MG tablet Take 1 tablet (5 mg total) by mouth daily. 90 tablet 3   aspirin EC 81 MG tablet Take 1 tablet (81 mg total) by mouth every morning. (Patient taking differently: Take 81 mg by mouth every other day.)     atorvastatin (LIPITOR) 40 MG tablet Take 40 mg by mouth daily.     cetirizine (ZYRTEC) 10 MG tablet Take 10 mg by mouth daily as needed for allergies.     cholecalciferol (VITAMIN D) 1000 UNITS tablet Take 1,000 Units by mouth daily.      clopidogrel (PLAVIX) 75 MG tablet Take 1 tablet (75 mg total) by mouth every morning.     empagliflozin (JARDIANCE) 25 MG TABS tablet 25 mg in the morning. Take 1/2 tablet every morning     finasteride (PROSCAR) 5 MG tablet Take 5 mg by mouth daily.     glipiZIDE (GLUCOTROL) 5 MG tablet 2.5 mg daily before breakfast.     isosorbide mononitrate (IMDUR) 30 MG 24 hr tablet Take 30 mg by mouth daily.     levothyroxine (SYNTHROID, LEVOTHROID) 25 MCG tablet Take 37.5 mcg by mouth daily before breakfast.   0   lisinopril-hydrochlorothiazide (ZESTORETIC) 10-12.5 MG tablet Take 1 tablet by mouth daily.     metFORMIN (GLUMETZA) 500 MG (MOD) 24 hr tablet Take 1,000 mg by mouth daily before supper.      metoprolol (LOPRESSOR) 50 MG tablet Take 25 mg by mouth 2 (two) times daily.     Omega-3 Fatty Acids (FISH OIL) 1000 MG CAPS  Take 1,000 mg by mouth daily.      albuterol (ACCUNEB) 0.63 MG/3ML nebulizer solution Take 1 ampule by nebulization every 4 (four) hours. (Patient not taking: Reported on 02/17/2023)     atorvastatin (LIPITOR) 80 MG tablet Take 80 mg by mouth at bedtime.  (Patient not taking: Reported on 02/17/2023)     glipiZIDE (GLUCOTROL) 5 MG tablet Take by mouth. (Patient not taking: Reported on 02/17/2023)     lisinopril-hydrochlorothiazide (PRINZIDE,ZESTORETIC) 10-12.5 MG tablet Take 2 tablets by mouth daily. (Patient not taking: Reported on 02/17/2023)     No current facility-administered medications for this visit.    Allergies:   Other    Social History:  The patient  reports that he quit smoking about 47 years ago. His smoking use included cigarettes. He has a 60.00 pack-year smoking history. His smokeless tobacco use includes chew. He reports that he does not drink alcohol and does not use drugs.    ROS:  Please see the history of present illness.   All other systems are reviewed and are negative.   PHYSICAL EXAM: VS:  BP (!) 116/58 (BP Location: Left Arm, Patient Position: Sitting, Cuff  Size: Normal)   Pulse 60   Ht 5\' 10"  (1.778 m)   Wt 162 lb 12.8 oz (73.8 kg)   SpO2 91%   BMI 23.36 kg/m  , BMI Body mass index is 23.36 kg/m.    General: Alert, oriented x3, no distress, healthy left subclavian pacemaker site Head: no evidence of trauma, PERRL, EOMI, no exophtalmos or lid lag, no myxedema, no xanthelasma; normal ears, nose and oropharynx Neck: normal jugular venous pulsations and no hepatojugular reflux; brisk carotid pulses without delay and no carotid bruits Chest: clear to auscultation, no signs of consolidation by percussion or palpation, normal fremitus, symmetrical and full respiratory excursions Cardiovascular: normal position and quality of the apical impulse, regular rhythm, normal first and second heart sounds, no murmurs, rubs or gallops Abdomen: no tenderness or distention, no masses by palpation, no abnormal pulsatility or arterial bruits, normal bowel sounds, no hepatosplenomegaly Extremities: no clubbing, cyanosis or edema; 2+ radial, ulnar and brachial pulses bilaterally; 2+ right femoral, posterior tibial and dorsalis pedis pulses; 2+ left femoral, posterior tibial and dorsalis pedis pulses; no subclavian or femoral bruits Neurological: grossly nonfocal Psych: Normal mood and affect    EKG:  EKG is not ordered today.  The tracing from 11/29/2022 is personally reviewed and shows atrial paced, ventricular sensed rhythm and is otherwise a normal tracing.  Lipid Panel    Component Value Date/Time   CHOL 106 01/23/2021 0000   TRIG 82 01/23/2021 0000   HDL 60 01/23/2021 0000   CHOLHDL 4.1 02/28/2012 0857   VLDL 21 02/28/2012 0857   LDLCALC 107 (H) 02/28/2012 0857      Wt Readings from Last 3 Encounters:  02/17/23 162 lb 12.8 oz (73.8 kg)  11/29/22 164 lb 3.2 oz (74.5 kg)  06/07/22 167 lb 9.6 oz (76 kg)     1. Pacemaker battery depletion   2. SSS (sick sinus syndrome)   3. Second degree Mobitz type II incomplete atrioventricular block   4.  Coronary artery disease involving native coronary artery of native heart without angina pectoris   5. Hypercholesterolemia   6. Acquired hypothyroidism   7. Syncope, vasovagal   8. Complex sleep apnea syndrome   9. Essential hypertension       ASSESSMENT AND PLAN:  1. SSS: Heart rate histogram distribution appears appropriate with the  current sensor settings (we turned on rate response about 2 years ago). 2. Second degree AV block: He does have almost 40% ventricular pacing, but thankfully has not had any signs of heart failure. 3. PPM: Device has reached ERI and we will schedule him for pacemaker generator change out.  We discussed the pacemaker generator change out in great detail today. This procedure has been fully reviewed with the patient and informed consent has been obtained.  He is not device dependent. 4. CAD: Remains asymptomatic.  He has not had any new events since his initial presentation with myocardial infarction 12 years ago.  He is on dual antiplatelet therapy, maximum dose atorvastatin and 3 antianginal medications (beta-blocker, calcium channel blocker, long-acting nitrates. 5. Hypercholesterolemia: On maximum dose atorvastatin.  [facility].  Labs checked 2 weeks ago but not yet available for review. 6. Hypothyroidism: Clinically euthyroid. 7. Syncope: he had a single vasovagal event about 4 years ago.  It has not recurred. 8. Complex sleep apnea: Mixed obstructive and central, on nocturnal BiPAP, followed by Dr. Tresa EndoKelly.  Reports compliance with BiPAP and denies daytime hypersomnolence. 9. HTN: Blood pressure is well controlled.  He is on 4 different agents for control (amlodipine, lisinopril, hydrochlorothiazide, metoprolol).  Patient Instructions  Medication Instructions:  Hold Plavix starting tomorrow 02/18/23. Do not start this medication back until the provider instructs you to.  *If you need a refill on your cardiac medications before  your next appointment, please call your pharmacy*   Lab Work: CBC, BMP-today If you have labs (blood work) drawn today and your tests are completely normal, you will receive your results only by: MyChart Message (if you have MyChart) OR A paper copy in the mail If you have any lab test that is abnormal or we need to change your treatment, we will call you to review the results.   Testing/Procedures:    Implantable Device Instructions    Lonnie Shortdward M Christopher Jr.  02/17/2023  You are scheduled for a PPM generator change on Thursday, April 18 with Dr. Rachelle HoraMihai Zayne Draheim.  1. Pre procedure Lab testing: Labs drawn today   2. Please arrive at the Main En[facility] at [facility]: [address] [place], [place] [address] on April 18 at 11:30 AM (This time is two hours before your procedure to ensure your preparation). Free valet parking service is available. You will check in at ADMITTING. The support person will be asked to wait in the waiting room.  It is OK to have someone drop you off and come back when you are ready to be discharged.        Special note: Every effort is made to have your procedure done on time. Please understand that emergencies sometimes delay  scheduled procedures.  3.  No eating or drinking after midnight prior to procedure.     4.  Medication instructions:  On the morning of your procedure hold your Plavix (Clopidogrel) for 7 day(s) prior to your procedure. Your last dose will be Thursday, April 11, AM dose.   5.  The night before your procedure and the morning of your procedure scrub your neck/chest with CHG surgical scrub.  See instruction letter.  6. Plan to go home the same day, you will only stay overnight if medically necessary. 7.  You MUST have a responsible adult to drive you home. 8.   An adult MUST be with you the first 24 hours after you arrive home.  9..  Bring a current list of your medications, and the last time and date medication taken. 10. Bring  ID and current insurance cards. 11. .Please wear clothes that are easy to get on and off and wear slip-on shoes.    You will follow up with the [facility] Device clinic 10-14 days after your procedure.  You will follow up with Dr. Rachelle HoraMihai Gill [place] 91 days after your procedure.  These appointments will be made for you.   * If you have ANY questions after you get home, please call the office at [phone] or send a MyChart message.  FYI: For your safety, and to allow us to monitor your vital signs accurately during the surgery/procedure we request that if you have artificial nails, gel coating, SNS etc. Please have those removed prior to your surgery/procedure. Not having the nail coverings /polish removed may result in cancellation or delay of your surgery/procedure.    [place] - Preparing For Surgery    Before surgery, you can play an important role. Because skin is not sterile, your skin needs to be as free of germs as possible. You can reduce the number of germs on your skin by washing with CHG (chlorahexidine gluconate) Soap before surgery.  CHG is an antiseptic cleaner which kills germs and bonds with the skin to continue killing germs even after washing.  Please do not use if you have an allergy to CHG or antibacterial soaps.  If your skin becomes reddened/irritated stop using the CHG.   Do not shave (including legs and underarms) for at least 48 hours prior to first CHG shower.  It is OK to shave your face.  Please follow these instructions carefully:  1.  Shower the night before surgery and the morning of surgery with CHG.  2.  If you choose to wash your hair, wash your hair first as usual with your normal shampoo.  3.  After you shampoo, rinse your hair and body thoroughly to remove the shampoo.  4.  Use CHG as you would any other liquid soap.  You can apply CHG directly to the skin and wash gently with a clean washcloth. 5.  Apply the CHG Soap to your body ONLY FROM THE NECK DOWN.   Do not use on open wounds or open sores.  Avoid contact with your eyes, ears, mouth and genitals (private parts).  Wash genitals (private parts) with your normal soap.  6.  Wash thoroughly, paying special attention to the area where your surgery will be performed.  7.  Thoroughly rinse your body with warm water from the neck down.   8.  DO NOT shower/wash with your normal soap after using and rinsing off the CHG soap.  9.  Pat yourself dry with a clean towel.           10.  Wear clean pajamas.           11.  Place clean sheets on your bed the night of your first shower and do not sleep with pets.  Day of Surgery: Do not apply any deodorants/lotions.  Please wear clean clothes to the hospital/surgery center.    Follow-Up: At [facility], you and your health needs are our priority.  As part of our continuing mission to provide you with exceptional heart care, we have created designated Provider Care Teams.  These Care Teams include your primary Cardiologist (physician) and Advanced Practice Providers (APPs -  Physician Assistants and Nurse Practitioners) who  all work together to provide you with the care you need, when you need it.  We recommend signing up for the patient portal called "MyChart".  Sign up information is provided on this After Visit Summary.  MyChart is used to connect with patients for Virtual Visits (Telemedicine).  Patients are able to view lab/test results, encounter notes, upcoming appointments, etc.  Non-urgent messages can be sent to your provider as well.   To learn more about what you can do with MyChart, go to [email or web address].    Your next appointment:   Will schedule after Pacemaker change out.        Signed, Thurmon Fair, MD  02/17/2023 1:36 PM    Thurmon Fair, MD, [facility] [phone] office [phone] pager

## 2023-02-17 NOTE — Patient Instructions (Signed)
Medication Instructions:  Hold Plavix starting tomorrow 02/18/23. Do not start this medication back until the provider instructs you to.  *If you need a refill on your cardiac medications before your next appointment, please call your pharmacy*   Lab Work: CBC, BMP-today If you have labs (blood work) drawn today and your tests are completely normal, you will receive your results only by: MyChart Message (if you have MyChart) OR A paper copy in the mail If you have any lab test that is abnormal or we need to change your treatment, we will call you to review the results.   Testing/Procedures:    Implantable Device Instructions    Lonnie Marshall.  02/17/2023  You are scheduled for a PPM generator change on Thursday, April 18 with Dr. Rachelle Hora Croitoru.  1. Pre procedure Lab testing: Labs drawn today   2. Please arrive at the Main Entrance A at [facility]: [address] [place], [place] [address] on April 18 at 11:30 AM (This time is two hours before your procedure to ensure your preparation). Free valet parking service is available. You will check in at ADMITTING. The support person will be asked to wait in the waiting room.  It is OK to have someone drop you off and come back when you are ready to be discharged.        Special note: Every effort is made to have your procedure done on time. Please understand that emergencies sometimes delay  scheduled procedures.  3.  No eating or drinking after midnight prior to procedure.     4.  Medication instructions:  On the morning of your procedure hold your Plavix (Clopidogrel) for 7 day(s) prior to your procedure. Your last dose will be Thursday, April 11, AM dose.   5.  The night before your procedure and the morning of your procedure scrub your neck/chest with CHG surgical scrub.  See instruction letter.  6. Plan to go home the same day, you will only stay overnight if medically necessary. 7.  You MUST have a responsible adult  to drive you home. 8.   An adult MUST be with you the first 24 hours after you arrive home. 9..  Bring a current list of your medications, and the last time and date medication taken. 10. Bring ID and current insurance cards. 11. .Please wear clothes that are easy to get on and off and wear slip-on shoes.    You will follow up with the [facility] Device clinic 10-14 days after your procedure.  You will follow up with Dr. Rachelle Hora Croitoru 91 days after your procedure.  These appointments will be made for you.   * If you have ANY questions after you get home, please call the office at [phone] or send a MyChart message.  FYI: For your safety, and to allow [place] to monitor your vital signs accurately during the surgery/procedure we request that if you have artificial nails, gel coating, SNS etc. Please have those removed prior to your surgery/procedure. Not having the nail coverings /polish removed may result in cancellation or delay of your surgery/procedure.    [place] - Preparing For Surgery    Before surgery, you can play an important role. Because skin is not sterile, your skin needs to be as free of germs as possible. You can reduce the number of germs on your skin by washing with CHG (chlorahexidine gluconate) Soap before surgery.  CHG is an antiseptic cleaner which kills  germs and bonds with the skin to continue killing germs even after washing.  Please do not use if you have an allergy to CHG or antibacterial soaps.  If your skin becomes reddened/irritated stop using the CHG.   Do not shave (including legs and underarms) for at least 48 hours prior to first CHG shower.  It is OK to shave your face.  Please follow these instructions carefully:  1.  Shower the night before surgery and the morning of surgery with CHG.  2.  If you choose to wash your hair, wash your hair first as usual with your normal shampoo.  3.  After you shampoo, rinse your hair and body thoroughly to remove the  shampoo.  4.  Use CHG as you would any other liquid soap.  You can apply CHG directly to the skin and wash gently with a clean washcloth. 5.  Apply the CHG Soap to your body ONLY FROM THE NECK DOWN.  Do not use on open wounds or open sores.  Avoid contact with your eyes, ears, mouth and genitals (private parts).  Wash genitals (private parts) with your normal soap.  6.  Wash thoroughly, paying special attention to the area where your surgery will be performed.  7.  Thoroughly rinse your body with warm water from the neck down.   8.  DO NOT shower/wash with your normal soap after using and rinsing off the CHG soap.  9.  Pat yourself dry with a clean towel.           10.  Wear clean pajamas.           11.  Place clean sheets on your bed the night of your first shower and do not sleep with pets.  Day of Surgery: Do not apply any deodorants/lotions.  Please wear clean clothes to the hospital/surgery center.    Follow-Up: At [facility], you and your health needs are our priority.  As part of our continuing mission to provide you with exceptional heart care, we have created designated Provider Care Teams.  These Care Teams include your primary Cardiologist (physician) and Advanced Practice Providers (APPs -  Physician Assistants and Nurse Practitioners) who all work together to provide you with the care you need, when you need it.  We recommend signing up for the patient portal called "MyChart".  Sign up information is provided on this After Visit Summary.  MyChart is used to connect with patients for Virtual Visits (Telemedicine).  Patients are able to view lab/test results, encounter notes, upcoming appointments, etc.  Non-urgent messages can be sent to your provider as well.   To learn more about what you can do with MyChart, go to [email or web address].    Your next appointment:   Will schedule after Pacemaker change out.

## 2023-02-17 NOTE — Progress Notes (Signed)
Patient ID: Lonnie ShortEdward M Copeman Jr., male   DOB: 07-Sep-1936, 87 y.o.   MRN: [id number]     Cardiology Office Note   Date:  02/17/2023   ID:  Lonnie ShortEdward M Saliba Jr., DOB 07-Sep-1936, MRN [id number]  PCP:  Rick DuffMichaels, Ashley L, PA-C  Cardiologist:  Nicki Guadalajarahomas Kelly, MD;  Lonnie FairMihai Kreed Kauffman, MD   Chief Complaint  Patient presents with   Pacemaker Check     History of Present Illness: Lonnie Shortdward M Vecchio Jr. is a 87 y.o. male who presents for CAD, SSS, 2nd deg AV block, pacemaker follow up  [name] has history of both sinus node dysfunction and second-degree atrioventricular block Mobitz type I. He received a pacemaker in 2013. He has a history of coronary disease: ST segment elevation myocardial infarction around the time of pacemaker implantation. He had total proximal LAD occlusion treated with overlapping stents 2.75x24 2.5x38 DES Xience and Promus. 3 days later he underwent staged intervention to the obtuse marginal branch of the circumflex vessel. In September 2013 a nuclear study showed distal LAD scar but otherwise significant myocardial salvage and other territories. Ejection fraction was 64%. No angina since his acute MI in 2013.  The patient specifically denies any chest pain at rest exertion, dyspnea at rest or with exertion, orthopnea, paroxysmal nocturnal dyspnea, syncope, palpitations, focal neurological deficits, intermittent claudication, lower extremity edema, unexplained weight gain, cough, hemoptysis or wheezing.  He has not had any falls or bleeding problems.  He is on chronic dual antiplatelet therapy with aspirin and clopidogrel.  His pacemaker is at elective replacement interval with a battery voltage of 2.59 V.  He is not pacemaker dependent but has roughly 88% atrial pacing and 30% ventricular pacing.  His device has recorded single 10-second episode of very brief paroxysmal atrial tachycardia.  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  [facility]id [facility], checked 2 weeks ago.  He has  not heard from them yet.  Past Medical History:  Diagnosis Date   Allergic rhinitis    Arthritis    BPH with obstruction/lower urinary tract symptoms    hx of TURP   Chronic renal insufficiency, stage 3 (moderate)    GFR 50s   COPD (chronic obstructive pulmonary disease)    onset of sx's around 2013   Coronary artery disease    MI+stents 2013   Hearing loss, bilateral    hearing aid--it bothers him to have aid in R ear.   History of kidney stones    hx ureterolithiasis requiring procedure   History of lumbar surgery    Hypercholesterolemia    Hypertension    Hypothyroidism    Mobitz type 2 second degree atrioventricular block    has pacemaker   Overactive bladder    Pacemaker    Skull fracture 1942   "fell out of car; in hospital for some time"   Sleep apnea    Central + obstructive->bipap (Dr. Tresa EndoKelly)   SSS (sick sinus syndrome)    has pacemaker--followed by Dr. Jomarie Marshall   STEMI (ST elevation myocardial infarction), [place]  02/27/2012   Type II diabetes mellitus     Past Surgical History:  Procedure Laterality Date   APPENDECTOMY     as part of R colectomy surgery for tubulovillous adenoma   CARDIAC CATHETERIZATION  02/27/12   "1"   CIRCUMCISION  1980's   COLONOSCOPY  2002, 2005, 2015   CORONARY ANGIOPLASTY WITH STENT PLACEMENT  03/01/12   "+2=3 total"   CYSTOSCOPY WITH RETROGRADE  PYELOGRAM, URETEROSCOPY AND STENT PLACEMENT Left 07/13/2013   Procedure: CYSTOSCOPY WITH LEFT RETROGRADE PYELOGRAM, LEFT URETEROSCOPY AND left STENT PLACEMENT, cystogram;  Surgeon: Antony Haste, MD;  Location: [facility];  Service: Urology;  Laterality: Left;   CYSTOSCOPY/URETEROSCOPY/HOLMIUM LASER/STENT PLACEMENT Left 02/07/2018   Procedure: CYSTOSCOPY/LEFT RETROGRADE/LEFT URETEROSCOPY/HOLMIUM LASER LEFT /STENT PLACEMENT;  Surgeon: Jerilee Field, MD;  Location: [facility];  Service: Urology;  Laterality: Left;  ONLY NEEDS 60 MIN   INSERT / REPLACE / REMOVE PACEMAKER  03/17/12   "first  one"   LEFT HEART CATHETERIZATION WITH CORONARY ANGIOGRAM N/A 02/27/2012   Procedure: LEFT HEART CATHETERIZATION WITH CORONARY ANGIOGRAM;  Surgeon: Lennette Bihari, MD;  Location: [facility] CATH LAB;  Service: Cardiovascular;  Laterality: N/A;   LUMBAR DISC SURGERY  ~ 1985   PARTIAL COLECTOMY N/A 02/14/2014   for large tubulovillous adenoma in cecum. Path showed high grade dysplasia but no evidence of carcinoma, neg nodes. Procedure: RIGHT COLECTOMY;  Surgeon: Velora Heckler, MD;  Location: [facility];  Service: General;  Laterality: N/A;   PERCUTANEOUS CORONARY STENT INTERVENTION (PCI-S) N/A 03/01/2012   Procedure: PERCUTANEOUS CORONARY STENT INTERVENTION (PCI-S);  Surgeon: Marykay Lex, MD;  Location: [facility] CATH LAB;  Service: Cardiovascular;  Laterality: N/A;   PERMANENT PACEMAKER INSERTION N/A 03/17/2012   Procedure: PERMANENT PACEMAKER INSERTION;  Surgeon: Lonnie Fair, MD;  Location: [facility] CATH LAB;  Service: Cardiovascular;  Laterality: N/A;   TRANSURETHRAL RESECTION OF PROSTATE N/A 07/13/2013   Procedure: TRANSURETHRAL RESECTION OF THE PROSTATE (TURP);  Surgeon: Antony Haste, MD;  Location: [facility];  Service: Urology;  Laterality: N/A;   VASECTOMY  ~87 years old   same time as circumcision     Current Outpatient Medications  Medication Sig Dispense Refill   albuterol (PROVENTIL HFA;VENTOLIN HFA) 108 (90 Base) MCG/ACT inhaler Inhale 2 puffs into the lungs every 6 (six) hours as needed for wheezing or shortness of breath.     amLODipine (NORVASC) 5 MG tablet Take 1 tablet (5 mg total) by mouth daily. 90 tablet 3   aspirin EC 81 MG tablet Take 1 tablet (81 mg total) by mouth every morning. (Patient taking differently: Take 81 mg by mouth every other day.)     atorvastatin (LIPITOR) 40 MG tablet Take 40 mg by mouth daily.     cetirizine (ZYRTEC) 10 MG tablet Take 10 mg by mouth daily as needed for allergies.     cholecalciferol (VITAMIN D) 1000 UNITS tablet Take 1,000 Units by mouth daily.      clopidogrel (PLAVIX) 75 MG tablet Take 1 tablet (75 mg total) by mouth every morning.     empagliflozin (JARDIANCE) 25 MG TABS tablet 25 mg in the morning. Take 1/2 tablet every morning     finasteride (PROSCAR) 5 MG tablet Take 5 mg by mouth daily.     glipiZIDE (GLUCOTROL) 5 MG tablet 2.5 mg daily before breakfast.     isosorbide mononitrate (IMDUR) 30 MG 24 hr tablet Take 30 mg by mouth daily.     levothyroxine (SYNTHROID, LEVOTHROID) 25 MCG tablet Take 37.5 mcg by mouth daily before breakfast.   0   lisinopril-hydrochlorothiazide (ZESTORETIC) 10-12.5 MG tablet Take 1 tablet by mouth daily.     metFORMIN (GLUMETZA) 500 MG (MOD) 24 hr tablet Take 1,000 mg by mouth daily before supper.      metoprolol (LOPRESSOR) 50 MG tablet Take 25 mg by mouth 2 (two) times daily.     Omega-3 Fatty Acids (FISH OIL) 1000 MG CAPS  Take 1,000 mg by mouth daily.      albuterol (ACCUNEB) 0.63 MG/3ML nebulizer solution Take 1 ampule by nebulization every 4 (four) hours. (Patient not taking: Reported on 02/17/2023)     atorvastatin (LIPITOR) 80 MG tablet Take 80 mg by mouth at bedtime.  (Patient not taking: Reported on 02/17/2023)     glipiZIDE (GLUCOTROL) 5 MG tablet Take by mouth. (Patient not taking: Reported on 02/17/2023)     lisinopril-hydrochlorothiazide (PRINZIDE,ZESTORETIC) 10-12.5 MG tablet Take 2 tablets by mouth daily. (Patient not taking: Reported on 02/17/2023)     No current facility-administered medications for this visit.    Allergies:   Other    Social History:  The patient  reports that he quit smoking about 47 years ago. His smoking use included cigarettes. He has a 60.00 pack-year smoking history. His smokeless tobacco use includes chew. He reports that he does not drink alcohol and does not use drugs.    ROS:  Please see the history of present illness.   All other systems are reviewed and are negative.   PHYSICAL EXAM: VS:  BP (!) 116/58 (BP Location: Left Arm, Patient Position: Sitting, Cuff  Size: Normal)   Pulse 60   Ht 5\' 10"  (1.778 m)   Wt 162 lb 12.8 oz (73.8 kg)   SpO2 91%   BMI 23.36 kg/m  , BMI Body mass index is 23.36 kg/m.    General: Alert, oriented x3, no distress, healthy left subclavian pacemaker site Head: no evidence of trauma, PERRL, EOMI, no exophtalmos or lid lag, no myxedema, no xanthelasma; normal ears, nose and oropharynx Neck: normal jugular venous pulsations and no hepatojugular reflux; brisk carotid pulses without delay and no carotid bruits Chest: clear to auscultation, no signs of consolidation by percussion or palpation, normal fremitus, symmetrical and full respiratory excursions Cardiovascular: normal position and quality of the apical impulse, regular rhythm, normal first and second heart sounds, no murmurs, rubs or gallops Abdomen: no tenderness or distention, no masses by palpation, no abnormal pulsatility or arterial bruits, normal bowel sounds, no hepatosplenomegaly Extremities: no clubbing, cyanosis or edema; 2+ radial, ulnar and brachial pulses bilaterally; 2+ right femoral, posterior tibial and dorsalis pedis pulses; 2+ left femoral, posterior tibial and dorsalis pedis pulses; no subclavian or femoral bruits Neurological: grossly nonfocal Psych: Normal mood and affect    EKG:  EKG is not ordered today.  The tracing from 11/29/2022 is personally reviewed and shows atrial paced, ventricular sensed rhythm and is otherwise a normal tracing.  Lipid Panel    Component Value Date/Time   CHOL 106 01/23/2021 0000   TRIG 82 01/23/2021 0000   HDL 60 01/23/2021 0000   CHOLHDL 4.1 02/28/2012 0857   VLDL 21 02/28/2012 0857   LDLCALC 107 (H) 02/28/2012 0857      Wt Readings from Last 3 Encounters:  02/17/23 162 lb 12.8 oz (73.8 kg)  11/29/22 164 lb 3.2 oz (74.5 kg)  06/07/22 167 lb 9.6 oz (76 kg)     1. Pacemaker battery depletion   2. SSS (sick sinus syndrome)   3. Second degree Mobitz type II incomplete atrioventricular block   4.  Coronary artery disease involving native coronary artery of native heart without angina pectoris   5. Hypercholesterolemia   6. Acquired hypothyroidism   7. Syncope, vasovagal   8. Complex sleep apnea syndrome   9. Essential hypertension       ASSESSMENT AND PLAN:  1. SSS: Heart rate histogram distribution appears appropriate with the  current sensor settings (we turned on rate response about 2 years ago). 2. Second degree AV block: He does have almost 40% ventricular pacing, but thankfully has not had any signs of heart failure. 3. PPM: Device has reached ERI and we will schedule him for pacemaker generator change out.  We discussed the pacemaker generator change out in great detail today. This procedure has been fully reviewed with the patient and informed consent has been obtained.  He is not device dependent. 4. CAD: Remains asymptomatic.  He has not had any new events since his initial presentation with myocardial infarction 12 years ago.  He is on dual antiplatelet therapy, maximum dose atorvastatin and 3 antianginal medications (beta-blocker, calcium channel blocker, long-acting nitrates. 5. Hypercholesterolemia: On maximum dose atorvastatin.  [facility].  Labs checked 2 weeks ago but not yet available for review. 6. Hypothyroidism: Clinically euthyroid. 7. Syncope: he had a single vasovagal event about 4 years ago.  It has not recurred. 8. Complex sleep apnea: Mixed obstructive and central, on nocturnal BiPAP, followed by Dr. Tresa EndoKelly.  Reports compliance with BiPAP and denies daytime hypersomnolence. 9. HTN: Blood pressure is well controlled.  He is on 4 different agents for control (amlodipine, lisinopril, hydrochlorothiazide, metoprolol).  Patient Instructions  Medication Instructions:  Hold Plavix starting tomorrow 02/18/23. Do not start this medication back until the provider instructs you to.  *If you need a refill on your cardiac medications before  your next appointment, please call your pharmacy*   Lab Work: CBC, BMP-today If you have labs (blood work) drawn today and your tests are completely normal, you will receive your results only by: MyChart Message (if you have MyChart) OR A paper copy in the mail If you have any lab test that is abnormal or we need to change your treatment, we will call you to review the results.   Testing/Procedures:    Implantable Device Instructions    Lonnie Shortdward M Christopher Jr.  02/17/2023  You are scheduled for a PPM generator change on Thursday, April 18 with Dr. Rachelle HoraMihai Meka Lewan.  1. Pre procedure Lab testing: Labs drawn today   2. Please arrive at the Main Entrance [facility] at [facility]: [address] [place], [place] [address] on April 18 at 11:30 AM (This time is two hours before your procedure to ensure your preparation). Free valet parking service is available. You will check in at ADMITTING. The support person will be asked to wait in the waiting room.  It is OK to have someone drop you off and come back when you are ready to be discharged.        Special note: Every effort is made to have your procedure done on time. Please understand that emergencies sometimes delay  scheduled procedures.  3.  No eating or drinking after midnight prior to procedure.     4.  Medication instructions:  On the morning of your procedure hold your Plavix (Clopidogrel) for 7 day(s) prior to your procedure. Your last dose will be Thursday, April 11, AM dose.   5.  The night before your procedure and the morning of your procedure scrub your neck/chest with CHG surgical scrub.  See instruction letter.  6. Plan to go home the same day, you will only stay overnight if medically necessary. 7.  You MUST have a responsible adult to drive you home. 8.   An adult MUST be with you the first 24 hours after you arrive home.  9..  Bring a current list of your medications, and the last time and date medication taken. 10. Bring  ID and current insurance cards. 11. .Please wear clothes that are easy to get on and off and wear slip-on shoes.    You will [facility] 10-14 days after your procedure.  You will follow up with Dr. Rachelle HoraMihai Keiffer Piper 91 days after your procedure.  These appointments will be made for you.   * If you have ANY questions after you get home, please call the office at [phone] or send a MyChart message.  FYI: For your safety, and to allow us to monitor your vital signs accurately during the surgery/procedure we request that if you have artificial nails, gel coating, SNS etc. Please have those removed prior to your surgery/procedure. Not having the nail coverings /polish removed may result in cancellation or delay of your surgery/procedure.    [place] - Preparing For Surgery    Before surgery, you can play an important role. Because skin is not sterile, your skin needs to be as free of germs as possible. You can reduce the number of germs on your skin by washing with CHG (chlorahexidine gluconate) Soap before surgery.  CHG is an antiseptic cleaner which kills germs and bonds with the skin to continue killing germs even after washing.  Please do not use if you have an allergy to CHG or antibacterial soaps.  If your skin becomes reddened/irritated stop using the CHG.   Do not shave (including legs and underarms) for at least 48 hours prior to first CHG shower.  It is OK to shave your face.  Please follow these instructions carefully:  1.  Shower the night before surgery and the morning of surgery with CHG.  2.  If you choose to wash your hair, wash your hair first as usual with your normal shampoo.  3.  After you shampoo, rinse your hair and body thoroughly to remove the shampoo.  4.  Use CHG as you would any other liquid soap.  You can apply CHG directly to the skin and wash gently with a clean washcloth. 5.  Apply the CHG Soap to your body ONLY FROM THE NECK DOWN.   Do not use on open wounds or open sores.  Avoid contact with your eyes, ears, mouth and genitals (private parts).  Wash genitals (private parts) with your normal soap.  6.  Wash thoroughly, paying special attention to the area where your surgery will be performed.  7.  Thoroughly rinse your body with warm water from the neck down.   8.  DO NOT shower/wash with your normal soap after using and rinsing off the CHG soap.  9.  Pat yourself dry with a clean towel.           10.  Wear clean pajamas.           11.  Place clean sheets on your bed the night of your first shower and do not sleep with pets.  Day of Surgery: Do not apply any deodorants/lotions.  Please wear clean clothes to the hospital/surgery center.    Follow-Up: At [facility], you and your health needs are our priority.  As part of our continuing mission to provide you with exceptional heart care, we have created designated Provider Care Teams.  These Care Teams include your primary Cardiologist (physician) and Advanced Practice Providers (APPs -  Physician Assistants and Nurse Practitioners) who  all work together to provide you with the care you need, when you need it.  We recommend signing up for the patient portal called "MyChart".  Sign up information is provided on this After Visit Summary.  MyChart is used to connect with patients for Virtual Visits (Telemedicine).  Patients are able to view lab/test results, encounter notes, upcoming appointments, etc.  Non-urgent messages can be sent to your provider as well.   To learn more about what you can do with MyChart, go to [email or web address].    Your next appointment:   Will schedule after Pacemaker change out.        Signed, Konica Stankowski, MD  02/17/2023 1:36 PM    Taelor Moncada, MD, FACC [facility] [phone] office [phone] pager 

## 2023-02-18 LAB — BASIC METABOLIC PANEL
BUN/Creatinine Ratio: 13 (ref 10–24)
BUN: 19 mg/dL (ref 8–27)
CO2: 23 mmol/L (ref 20–29)
Calcium: 9.9 mg/dL (ref 8.6–10.2)
Chloride: 104 mmol/L (ref 96–106)
Creatinine, Ser: 1.44 mg/dL — ABNORMAL HIGH (ref 0.76–1.27)
Glucose: 159 mg/dL — ABNORMAL HIGH (ref 70–99)
Potassium: 5.3 mmol/L — ABNORMAL HIGH (ref 3.5–5.2)
Sodium: 142 mmol/L (ref 134–144)
eGFR: 48 mL/min/{1.73_m2} — ABNORMAL LOW (ref >59)

## 2023-02-18 LAB — CBC
Hematocrit: 48 % (ref 37.5–51.0)
Hemoglobin: 15.5 g/dL (ref 13.0–17.7)
MCH: 31.6 pg (ref 26.6–33.0)
MCHC: 32.3 g/dL (ref 31.5–35.7)
MCV: 98 fL — ABNORMAL HIGH (ref 79–97)
Platelets: 311 10*3/uL (ref 150–450)
RBC: 4.91 x10E6/uL (ref 4.14–5.80)
RDW: 12.3 % (ref 11.6–15.4)
WBC: 8 10*3/uL (ref 3.4–10.8)

## 2023-02-24 ENCOUNTER — Ambulatory Visit (HOSPITAL_COMMUNITY)
Admission: RE | Admit: 2023-02-24 | Discharge: 2023-02-24 | Disposition: A | Discharge status: Home / Self Care | Payer: Medicare Other | Source: Ambulatory Visit | Attending: Cardiovascular Disease | Admitting: Cardiovascular Disease

## 2023-02-24 ENCOUNTER — Encounter (HOSPITAL_COMMUNITY)
Admission: RE | Disposition: A | Discharge status: Home / Self Care | Payer: Self-pay | Source: Ambulatory Visit | Attending: Cardiovascular Disease

## 2023-02-24 ENCOUNTER — Other Ambulatory Visit: Payer: Self-pay

## 2023-02-24 DIAGNOSIS — I252 Old myocardial infarction: Secondary | ICD-10-CM | POA: Insufficient documentation

## 2023-02-24 DIAGNOSIS — I495 Sick sinus syndrome: Secondary | ICD-10-CM

## 2023-02-24 DIAGNOSIS — E7800 Pure hypercholesterolemia, unspecified: Secondary | ICD-10-CM | POA: Diagnosis not present

## 2023-02-24 DIAGNOSIS — Z45010 Encounter for checking and testing of cardiac pacemaker pulse generator [battery]: Secondary | ICD-10-CM

## 2023-02-24 DIAGNOSIS — I2510 Atherosclerotic heart disease of native coronary artery without angina pectoris: Secondary | ICD-10-CM | POA: Diagnosis not present

## 2023-02-24 DIAGNOSIS — I441 Atrioventricular block, second degree: Secondary | ICD-10-CM | POA: Diagnosis not present

## 2023-02-24 DIAGNOSIS — I10 Essential (primary) hypertension: Secondary | ICD-10-CM | POA: Insufficient documentation

## 2023-02-24 DIAGNOSIS — G4730 Sleep apnea, unspecified: Secondary | ICD-10-CM | POA: Diagnosis not present

## 2023-02-24 DIAGNOSIS — Z87891 Personal history of nicotine dependence: Secondary | ICD-10-CM | POA: Diagnosis not present

## 2023-02-24 DIAGNOSIS — Z7902 Long term (current) use of antithrombotics/antiplatelets: Secondary | ICD-10-CM | POA: Diagnosis not present

## 2023-02-24 DIAGNOSIS — Z79899 Other long term (current) drug therapy: Secondary | ICD-10-CM | POA: Insufficient documentation

## 2023-02-24 DIAGNOSIS — E039 Hypothyroidism, unspecified: Secondary | ICD-10-CM | POA: Insufficient documentation

## 2023-02-24 DIAGNOSIS — Z7982 Long term (current) use of aspirin: Secondary | ICD-10-CM | POA: Insufficient documentation

## 2023-02-24 HISTORY — PX: PPM GENERATOR CHANGEOUT: EP1233

## 2023-02-24 LAB — GLUCOSE, CAPILLARY
Glucose-Capillary: 146 mg/dL — ABNORMAL HIGH (ref 70–99)
Glucose-Capillary: 107 mg/dL — ABNORMAL HIGH (ref 70–99)

## 2023-02-24 SURGERY — PPM GENERATOR CHANGEOUT

## 2023-02-24 MED ORDER — SODIUM CHLORIDE 0.9 % IV SOLN
80.0000 mg | INTRAVENOUS | Status: AC
  Administered 2023-02-24: 80 mg

## 2023-02-24 MED ORDER — ACETAMINOPHEN 325 MG PO TABS: 325.0000 mg | ORAL_TABLET | ORAL | Status: DC | PRN

## 2023-02-24 MED ORDER — POVIDONE-IODINE 10 % EX SWAB
2.0000 | Freq: Once | CUTANEOUS | Status: AC
  Administered 2023-02-24: 2 via TOPICAL

## 2023-02-24 MED ORDER — LIDOCAINE HCL (PF) 1 % IJ SOLN
INTRAMUSCULAR | Status: AC
  Filled 2023-02-24: qty 30

## 2023-02-24 MED ORDER — LIDOCAINE HCL (PF) 1 % IJ SOLN
INTRAMUSCULAR | Status: DC | PRN
  Administered 2023-02-24: 50 mL

## 2023-02-24 MED ORDER — SODIUM CHLORIDE 0.9% FLUSH: 3.0000 mL | Freq: Two times a day (BID) | INTRAVENOUS | Status: DC

## 2023-02-24 MED ORDER — ONDANSETRON HCL 4 MG/2ML IJ SOLN: 4.0000 mg | Freq: Four times a day (QID) | INTRAMUSCULAR | Status: DC | PRN

## 2023-02-24 MED ORDER — SODIUM CHLORIDE 0.9 % IV SOLN: INTRAVENOUS | Status: DC | PRN

## 2023-02-24 MED ORDER — CEFAZOLIN SODIUM-DEXTROSE 2-4 GM/100ML-% IV SOLN
2.0000 g | INTRAVENOUS | Status: AC
  Administered 2023-02-24: 2 g via INTRAVENOUS

## 2023-02-24 MED ORDER — CEFAZOLIN SODIUM-DEXTROSE 2-4 GM/100ML-% IV SOLN
INTRAVENOUS | Status: AC
  Filled 2023-02-24: qty 100

## 2023-02-24 MED ORDER — SODIUM CHLORIDE 0.9 % IV SOLN
INTRAVENOUS | Status: AC
  Filled 2023-02-24: qty 2

## 2023-02-24 MED ORDER — SODIUM CHLORIDE 0.9 % IV SOLN: INTRAVENOUS | Status: DC

## 2023-02-24 MED ORDER — SODIUM CHLORIDE 0.9% FLUSH: 3.0000 mL | INTRAVENOUS | Status: DC | PRN

## 2023-02-24 SURGICAL SUPPLY — 4 items
CABLE SURGICAL S-101-97-12 (CABLE) ×2 IMPLANT
PACEMAKER ASSURITY DR-RF (Pacemaker) IMPLANT
PAD DEFIB RADIO PHYSIO CONN (PAD) ×2 IMPLANT
TRAY PACEMAKER INSERTION (PACKS) ×2 IMPLANT

## 2023-02-24 NOTE — Discharge Instructions (Signed)

## 2023-02-24 NOTE — Op Note (Signed)
Procedure report  Procedure performed:  1. Dual chamber pacemaker generator changeout  2. Light sedation  Reason for procedure:  1. Device generator at elective replacement interval  Procedure performed by:  Thurmon Fair, MD  Complications:  None  Estimated blood loss:  <5 mL  Medications administered during procedure:  Ancef 1 g intravenously, lidocaine 1% 30 mL locally,  Device details:   New Generator [employer] Assurity MRI model number [id number], serial number [id number] Right atrial lead (chronic) St. Jude, model number 2088TC-52, serial number [id number] (implanted 03/17/2012) Right ventricular lead (chronic)  St. Jude, model number 2088TC-58, serial number [id number] (implanted 03/17/2012)  Explanted generator St. Jude Accent,  model number 2210, serial number  [id number] (implanted 03/17/2012)  Procedure details:  After the risks and benefits of the procedure were discussed the patient provided informed consent. She was brought to the cardiac catheter lab in the fasting state. The patient was prepped and draped in usual sterile fashion. Local anesthesia with 1% lidocaine was administered to to the left infraclavicular area. A 5-6cm horizontal incision was made parallel with and 2-3 cm caudal to the left clavicle, in the area of an old scar. An older scar was seen closer to the left clavicle. Using minimal electrocautery and mostly sharp and blunt dissection the prepectoral pocket was opened carefully to avoid injury to the loops of chronic leads. Extensive dissection was not necessary. The device was explanted. The pocket was carefully inspected for hemostasis and flushed with copious amounts of antibiotic solution.  The leads were disconnected from the old generator and testing of the lead parameters later showed excellent values. The new generator was connected to the chronic leads, with appropriate pacing noted.   The entire system was then carefully inserted in the pocket with care been  taking that the leads and device assumed a comfortable position without pressure on the incision. Great care was taken that the leads be located deep to the generator. The pocket was then closed in layers using 2 layers of 2-0 Vicryl, one layer of 3-0 Vicryl and cutaneous steristrips after which a sterile dressing was applied.   At the end of the procedure the following lead parameters were encountered:   Right atrial lead sensed P waves 3.6 mV, impedance 400 ohms, threshold 1.0 at 0.5 ms pulse width.  Right ventricular lead sensed R waves  >12 mV, impedance 480 ohms, threshold 1.0 at 0.5 ms pulse width.  Thurmon Fair, MD, [facility] [phone] office [phone] pager

## 2023-02-24 NOTE — Interval H&P Note (Signed)
History and Physical Interval Note:  02/24/2023 1:45 PM  Lonnie Marshall.  has presented today for surgery, with the diagnosis of ERI.  The various methods of treatment have been discussed with the patient and family. After consideration of risks, benefits and other options for treatment, the patient has consented to  Procedure(s): PPM GENERATOR CHANGEOUT (N/A) as a surgical intervention.  The patient's history has been reviewed, patient examined, no change in status, stable for surgery.  I have reviewed the patient's chart and labs.  Questions were answered to the patient's satisfaction.     Judia Arnott

## 2023-02-25 ENCOUNTER — Encounter (HOSPITAL_COMMUNITY): Payer: Self-pay | Admitting: Cardiovascular Disease

## 2023-03-01 NOTE — Progress Notes (Signed)
Remote pacemaker transmission.   

## 2023-03-04 ENCOUNTER — Telehealth: Payer: Self-pay | Admitting: Cardiovascular Disease

## 2023-03-04 NOTE — Telephone Encounter (Signed)
Wife states husband needs different appt.  She has an appt the same day. Please reschedule

## 2023-03-04 NOTE — Telephone Encounter (Signed)
Wife calling to get patient wound check reschedule. Please advise

## 2023-03-09 ENCOUNTER — Ambulatory Visit: Payer: Medicare Other

## 2023-03-16 ENCOUNTER — Ambulatory Visit: Payer: Medicare Other | Attending: Internal Medicine

## 2023-03-16 DIAGNOSIS — I495 Sick sinus syndrome: Secondary | ICD-10-CM | POA: Diagnosis not present

## 2023-03-16 NOTE — Progress Notes (Signed)
Wound check appointment. Steri-strips removed prior to OV.  Wound without redness or edema. Incision edges approximated, wound well healed. Normal device function. Thresholds, sensing, and impedances consistent with implant measurements. Device programmed at chronic output values . Histogram distribution appropriate for patient and level of activity. No mode switches or high ventricular rates noted. Patient educated about wound care, arm mobility, lifting restrictions. ROV 06/02/23 with [facility]

## 2023-03-16 NOTE — Patient Instructions (Signed)

## 2023-05-30 ENCOUNTER — Ambulatory Visit (INDEPENDENT_AMBULATORY_CARE_PROVIDER_SITE_OTHER): Payer: Medicare Other

## 2023-05-30 DIAGNOSIS — I495 Sick sinus syndrome: Secondary | ICD-10-CM

## 2023-06-01 LAB — CUP PACEART REMOTE DEVICE CHECK
Battery Remaining Longevity: 100 mo
Battery Remaining Percentage: 95.5 %
Battery Voltage: 3.01 V
Brady Statistic AP VP Percent: 33 %
Brady Statistic AP VS Percent: 58 %
Brady Statistic AS VP Percent: 1 %
Brady Statistic AS VS Percent: 9.3 %
Brady Statistic RA Percent Paced: 90 %
Brady Statistic RV Percent Paced: 33 %
Date Time Interrogation Session: 20240722020015
Implantable Lead Connection Status: 753985
Implantable Lead Connection Status: 753985
Implantable Lead Implant Date: 20130510
Implantable Lead Implant Date: 20130510
Implantable Lead Location: 753859
Implantable Lead Location: 753860
Implantable Pulse Generator Implant Date: 20240418
Lead Channel Impedance Value: 400 Ohm
Lead Channel Impedance Value: 450 Ohm
Lead Channel Pacing Threshold Amplitude: 0.75 V
Lead Channel Pacing Threshold Amplitude: 1 V
Lead Channel Pacing Threshold Pulse Width: 0.5 ms
Lead Channel Pacing Threshold Pulse Width: 0.5 ms
Lead Channel Sensing Intrinsic Amplitude: 10.8 mV
Lead Channel Sensing Intrinsic Amplitude: 3.9 mV
Lead Channel Setting Pacing Amplitude: 2.5 V
Lead Channel Setting Pacing Amplitude: 2.5 V
Lead Channel Setting Pacing Pulse Width: 0.5 ms
Lead Channel Setting Sensing Sensitivity: 2 mV
Pulse Gen Model: 2272
Pulse Gen Serial Number: 8168683

## 2023-06-02 ENCOUNTER — Encounter: Payer: Self-pay | Admitting: Cardiovascular Disease

## 2023-06-02 ENCOUNTER — Ambulatory Visit: Payer: Medicare Other | Admitting: Cardiovascular Disease

## 2023-06-02 VITALS — BP 108/54 | HR 60 | Ht 70.0 in | Wt 157.8 lb

## 2023-06-02 DIAGNOSIS — E7800 Pure hypercholesterolemia, unspecified: Secondary | ICD-10-CM

## 2023-06-02 DIAGNOSIS — I495 Sick sinus syndrome: Secondary | ICD-10-CM

## 2023-06-02 DIAGNOSIS — I4719 Other supraventricular tachycardia: Secondary | ICD-10-CM

## 2023-06-02 DIAGNOSIS — I441 Atrioventricular block, second degree: Secondary | ICD-10-CM

## 2023-06-02 DIAGNOSIS — E1159 Type 2 diabetes mellitus with other circulatory complications: Secondary | ICD-10-CM

## 2023-06-02 DIAGNOSIS — Z950 Presence of cardiac pacemaker: Secondary | ICD-10-CM

## 2023-06-02 DIAGNOSIS — I2510 Atherosclerotic heart disease of native coronary artery without angina pectoris: Secondary | ICD-10-CM

## 2023-06-02 DIAGNOSIS — I10 Essential (primary) hypertension: Secondary | ICD-10-CM | POA: Diagnosis not present

## 2023-06-02 DIAGNOSIS — Z7984 Long term (current) use of oral hypoglycemic drugs: Secondary | ICD-10-CM

## 2023-06-02 NOTE — Patient Instructions (Signed)
Medication Instructions:  No chanes *If you need a refill on your cardiac medications before your next appointment, please call your pharmacy*  Follow-Up: At [facility], you and your health needs are our priority.  As part of our continuing mission to provide you with exceptional heart care, we have created designated Provider Care Teams.  These Care Teams include your primary Cardiologist (physician) and Advanced Practice Providers (APPs -  Physician Assistants and Nurse Practitioners) who all work together to provide you with the care you need, when you need it.  We recommend signing up for the patient portal called "MyChart".  Sign up information is provided on this After Visit Summary.  MyChart is used to connect with patients for Virtual Visits (Telemedicine).  Patients are able to view lab/test results, encounter notes, upcoming appointments, etc.  Non-urgent messages can be sent to your provider as well.   To learn more about what you can do with MyChart, go to [email or web address].    Your next appointment:   1 year(s)  Provider:   Thurmon Fair, MD

## 2023-06-02 NOTE — Progress Notes (Signed)
Patient ID: Lonnie Short., male   DOB: 01-22-1936, 87 y.o.   MRN: [id number]     Cardiology Office Note   Date:  06/02/2023   ID:  Lonnie Short., DOB 03-20-1936, MRN [id number]  PCP:  Rick Duff, PA-C  Cardiologist:  Nicki Guadalajara, MD;  Thurmon Fair, MD   Chief Complaint  Patient presents with   Pacemaker Check     History of Present Illness: Lonnie Pyon. is a 87 y.o. male who presents for CAD, SSS, 2nd deg AV block, pacemaker follow up  Lonnie Marshall has history of both sinus node dysfunction and second-degree atrioventricular block Mobitz type I. He received a pacemaker in 2013 and had a pacemaker generator change out in April 2024 (Abbott surety model PM 2272, both atrial and ventricular leads are [facility] 2088 TC leads,  so his system is MRI conditional). He has a history of coronary disease: ST segment elevation myocardial infarction around the time of pacemaker implantation. He had total proximal LAD occlusion treated with overlapping stents 2.75x24 2.5x38 DES Xience and Promus. 3 days later he underwent staged intervention to the obtuse marginal branch of the circumflex vessel. In September 2013 a nuclear study showed distal LAD scar but otherwise significant myocardial salvage and other territories. Ejection fraction was 64%. No angina since his acute MI in 2013.  The patient specifically denies any chest pain at rest exertion, dyspnea at rest or with exertion, orthopnea, paroxysmal nocturnal dyspnea, syncope, palpitations, focal neurological deficits, intermittent claudication, lower extremity edema, unexplained weight gain, cough, hemoptysis or wheezing.  Has not had any falls injuries or bleeding problems on combination antiplatelet therapy with clopidogrel and aspirin.  Pacemaker interrogation shows normal device function.  91% atrial pacing and 32% ventricular pacing.  The heart rate histograms are very blunted, but he is also very sedentary according to his  wife.  On a few days earlier this month when he was performing yard work he had appropriate increase in his heart rate to 80-85% on maximum tracking rate.  Estimated generator longevity is 8-9 years.  He has not had any atrial fibrillation or high ventricular rates.  He is an Investment banker, operational.  His lipid profile is usually followed at the [facility] and has generally been excellent.  He has diabetes mellitus on combination SGLT2 inhibitor and metformin and glipizide.  Most recent hemoglobin A1c is dated, but was in desirable range at 7.3%.  Past Medical History:  Diagnosis Date   Allergic rhinitis    Arthritis    BPH with obstruction/lower urinary tract symptoms    hx of TURP   Chronic renal insufficiency, stage 3 (moderate) (HCC)    GFR 50s   COPD (chronic obstructive pulmonary disease) (HCC)    onset of sx's around 2013   Coronary artery disease    MI+stents 2013   Hearing loss, bilateral    hearing aid--it bothers him to have aid in R ear.   History of kidney stones    hx ureterolithiasis requiring procedure   History of lumbar surgery    Hypercholesterolemia    Hypertension    Hypothyroidism    Mobitz type 2 second degree atrioventricular block    has pacemaker   Overactive bladder    Pacemaker    Skull fracture (HCC) 1942   "fell out of car; in hospital for some time"   Sleep apnea    Central + obstructive->bipap (Dr. Tresa Endo)   SSS (sick sinus syndrome) (HCC)  has pacemaker--followed by Dr. Jomarie Longs   STEMI (ST elevation myocardial infarction), [place]  02/27/2012   Type II diabetes mellitus (HCC)     Past Surgical History:  Procedure Laterality Date   APPENDECTOMY     as part of R colectomy surgery for tubulovillous adenoma   CARDIAC CATHETERIZATION  02/27/12   "1"   CIRCUMCISION  1980's   COLONOSCOPY  2002, 2005, 2015   CORONARY ANGIOPLASTY WITH STENT PLACEMENT  03/01/12   "+2=3 total"   CYSTOSCOPY WITH RETROGRADE PYELOGRAM, URETEROSCOPY AND STENT PLACEMENT Left  07/13/2013   Procedure: CYSTOSCOPY WITH LEFT RETROGRADE PYELOGRAM, LEFT URETEROSCOPY AND left STENT PLACEMENT, cystogram;  Surgeon: Antony Haste, MD;  Location: [facility];  Service: Urology;  Laterality: Left;   CYSTOSCOPY/URETEROSCOPY/HOLMIUM LASER/STENT PLACEMENT Left 02/07/2018   Procedure: CYSTOSCOPY/LEFT RETROGRADE/LEFT URETEROSCOPY/HOLMIUM LASER LEFT /STENT PLACEMENT;  Surgeon: Jerilee Field, MD;  Location: [facility];  Service: Urology;  Laterality: Left;  ONLY NEEDS 60 MIN   INSERT / REPLACE / REMOVE PACEMAKER  03/17/12   "first one"   LEFT HEART CATHETERIZATION WITH CORONARY ANGIOGRAM N/A 02/27/2012   Procedure: LEFT HEART CATHETERIZATION WITH CORONARY ANGIOGRAM;  Surgeon: Lennette Bihari, MD;  Location: [facility] CATH LAB;  Service: Cardiovascular;  Laterality: N/A;   LUMBAR DISC SURGERY  ~ 1985   PARTIAL COLECTOMY N/A 02/14/2014   for large tubulovillous adenoma in cecum. Path showed high grade dysplasia but no evidence of carcinoma, neg nodes. Procedure: RIGHT COLECTOMY;  Surgeon: Velora Heckler, MD;  Location: [facility];  Service: General;  Laterality: N/A;   PERCUTANEOUS CORONARY STENT INTERVENTION (PCI-S) N/A 03/01/2012   Procedure: PERCUTANEOUS CORONARY STENT INTERVENTION (PCI-S);  Surgeon: Marykay Lex, MD;  Location: [facility] CATH LAB;  Service: Cardiovascular;  Laterality: N/A;   PERMANENT PACEMAKER INSERTION N/A 03/17/2012   Procedure: PERMANENT PACEMAKER INSERTION;  Surgeon: Thurmon Fair, MD;  Location: [facility] CATH LAB;  Service: Cardiovascular;  Laterality: N/A;   PPM GENERATOR CHANGEOUT N/A 02/24/2023   Procedure: PPM GENERATOR CHANGEOUT;  Surgeon: Thurmon Fair, MD;  Location: [facility] INVASIVE CV LAB;  Service: Cardiovascular;  Laterality: N/A;   TRANSURETHRAL RESECTION OF PROSTATE N/A 07/13/2013   Procedure: TRANSURETHRAL RESECTION OF THE PROSTATE (TURP);  Surgeon: Antony Haste, MD;  Location: [facility];  Service: Urology;  Laterality: N/A;   VASECTOMY  ~87 years old   same time as  circumcision     Current Outpatient Medications  Medication Sig Dispense Refill   albuterol (ACCUNEB) 0.63 MG/3ML nebulizer solution Take 1 ampule by nebulization every 4 (four) hours as needed for wheezing or shortness of breath.     albuterol (PROVENTIL HFA;VENTOLIN HFA) 108 (90 Base) MCG/ACT inhaler Inhale 2 puffs into the lungs every 6 (six) hours as needed for wheezing or shortness of breath.     amLODipine (NORVASC) 5 MG tablet Take 1 tablet (5 mg total) by mouth daily. 90 tablet 3   aspirin EC 81 MG tablet Take 1 tablet (81 mg total) by mouth every morning. (Patient taking differently: Take 81 mg by mouth every other day.)     atorvastatin (LIPITOR) 40 MG tablet Take 40 mg by mouth daily.     cetirizine (ZYRTEC) 10 MG tablet Take 10 mg by mouth daily as needed for allergies.     cholecalciferol (VITAMIN D) 1000 UNITS tablet Take 1,000 Units by mouth daily.     clopidogrel (PLAVIX) 75 MG tablet Take 1 tablet (75 mg total) by mouth every morning.     empagliflozin (JARDIANCE)  25 MG TABS tablet Take 12.5 mg by mouth 2 (two) times daily.     finasteride (PROSCAR) 5 MG tablet Take 5 mg by mouth daily.     glipiZIDE (GLUCOTROL) 5 MG tablet 2.5 mg daily before breakfast.     isosorbide mononitrate (IMDUR) 30 MG 24 hr tablet Take 30 mg by mouth daily.     levothyroxine (SYNTHROID, LEVOTHROID) 25 MCG tablet Take 37.5 mcg by mouth daily before breakfast.   0   metFORMIN (GLUCOPHAGE-XR) 500 MG 24 hr tablet Take 1,000 mg by mouth daily with breakfast.     metoprolol (LOPRESSOR) 50 MG tablet Take 25 mg by mouth 2 (two) times daily.     Omega-3 Fatty Acids (FISH OIL) 1200 MG CAPS Take 1,200 mg by mouth daily.     No current facility-administered medications for this visit.    Allergies:   Other    Social History:  The patient  reports that he quit smoking about 48 years ago. His smoking use included cigarettes. He started smoking about 68 years ago. He has a 60 pack-year smoking history. His  smokeless tobacco use includes chew. He reports that he does not drink alcohol and does not use drugs.    ROS:  Please see the history of present illness.   All other systems are reviewed and are negative.   PHYSICAL EXAM: VS:  BP (!) 108/54 (BP Location: Left Arm, Patient Position: Sitting, Cuff Size: Normal)   Pulse 60   Ht 5\' 10"  (1.778 m)   Wt 157 lb 12.8 oz (71.6 kg)   SpO2 96%   BMI 22.64 kg/m  , BMI Body mass index is 22.64 kg/m.    General: Alert, oriented x3, no distress, healthy left subclavian pacemaker site Head: no evidence of trauma, PERRL, EOMI, no exophtalmos or lid lag, no myxedema, no xanthelasma; normal ears, nose and oropharynx Neck: normal jugular venous pulsations and no hepatojugular reflux; brisk carotid pulses without delay and no carotid bruits Chest: clear to auscultation, no signs of consolidation by percussion or palpation, normal fremitus, symmetrical and full respiratory excursions Cardiovascular: normal position and quality of the apical impulse, regular rhythm, normal first and second heart sounds, no murmurs, rubs or gallops Abdomen: no tenderness or distention, no masses by palpation, no abnormal pulsatility or arterial bruits, normal bowel sounds, no hepatosplenomegaly Extremities: no clubbing, cyanosis or edema; 2+ radial, ulnar and brachial pulses bilaterally; 2+ right femoral, posterior tibial and dorsalis pedis pulses; 2+ left femoral, posterior tibial and dorsalis pedis pulses; no subclavian or femoral bruits Neurological: grossly nonfocal Psych: Normal mood and affect    EKG:  EKG is ordered today and shows atrial paced, ventricular sensed rhythm with long AV conduction (AV delay 260 ms), minor nonspecific inferior leads T wave inversion  Lipid Panel    Component Value Date/Time   CHOL 106 01/23/2021 0000   TRIG 82 01/23/2021 0000   HDL 60 01/23/2021 0000   CHOLHDL 4.1 02/28/2012 0857   VLDL 21 02/28/2012 0857   LDLCALC 107 (H)  02/28/2012 0857      Latest Ref Rng & Units 02/17/2023    1:17 PM 04/23/2021    9:52 AM 04/09/2021    9:27 AM  BMP  Glucose 70 - 99 mg/dL 161  096  045   BUN 8 - 27 mg/dL 19  25  19    Creatinine 0.76 - 1.27 mg/dL 4.09  8.11  9.14   BUN/Creat Ratio 10 - 24 13  Sodium 134 - 144 mmol/L 142  138  140   Potassium 3.5 - 5.2 mmol/L 5.3  4.5  4.8   Chloride 96 - 106 mmol/L 104  103  103   CO2 20 - 29 mmol/L 23  26  24    Calcium 8.6 - 10.2 mg/dL 9.9  9.0  9.1        Wt Readings from Last 3 Encounters:  06/02/23 157 lb 12.8 oz (71.6 kg)  02/24/23 162 lb (73.5 kg)  02/17/23 162 lb 12.8 oz (73.8 kg)     1. Primary hypertension   2. Paroxysmal atrial tachycardia   3. SSS (sick sinus syndrome) (HCC)   4. Second degree Mobitz type II incomplete atrioventricular block   5. Pacemaker   6. Coronary artery disease involving native coronary artery of native heart without angina pectoris   7. Hypercholesterolemia       ASSESSMENT AND PLAN:  1. SSS: Normal device function with appropriate heart rate histogram settings.  The blunted heart rate response is related to sedentary lifestyle. 2. Second degree AV block: Has ventricular pacing almost 1/3 of the time, but has not developed any symptoms of heart failure. 3. PPM: Has a MRI conditional system.  Normal device function.  Continue remote downloads every 3 months. 4. CAD: Denies angina pectoris, but is not very physically active..  He has not had any new events since his initial presentation with myocardial infarction 12 years ago.  He is on dual antiplatelet therapy, maximum dose atorvastatin and 3 antianginal medications (beta-blocker, calcium channel blocker, long-acting nitrates. 5. Hypercholesterolemia: On maximum dose atorvastatin, labs followed at the [facility]. 6. Hypothyroidism: Euthyroid clinically. 7. Syncope: he had a single vasovagal event about 4 years ago.  It has not recurred. 8. Complex sleep apnea: Mixed obstructive and  central, on nocturnal BiPAP, followed by Dr. Tresa Endo.  Reports compliance with BiPAP and denies daytime hypersomnolence. 9. HTN: Blood pressure is well controlled.  He is on 4 different agents for control (amlodipine, lisinopril, hydrochlorothiazide, metoprolol). 10. DM2: On 3 different agents including an SGLT2 inhibitor.  Patient Instructions  Medication Instructions:  No chanes *If you need a refill on your cardiac medications before your next appointment, please call your pharmacy*  Follow-Up: At [facility], you and your health needs are our priority.  As part of our continuing mission to provide you with exceptional heart care, we have created designated Provider Care Teams.  These Care Teams include your primary Cardiologist (physician) and Advanced Practice Providers (APPs -  Physician Assistants and Nurse Practitioners) who all work together to provide you with the care you need, when you need it.  We recommend signing up for the patient portal called "MyChart".  Sign up information is provided on this After Visit Summary.  MyChart is used to connect with patients for Virtual Visits (Telemedicine).  Patients are able to view lab/test results, encounter notes, upcoming appointments, etc.  Non-urgent messages can be sent to your provider as well.   To learn more about what you can do with MyChart, go to [email or web address].    Your next appointment:   1 year(s)  Provider:   Thurmon Fair, MD        Signed, Thurmon Fair, MD  06/02/2023 5:00 PM    Thurmon Fair, MD, [facility] [phone] office [phone] pager

## 2023-06-09 NOTE — Progress Notes (Signed)
Remote pacemaker transmission.   

## 2023-06-14 ENCOUNTER — Encounter: Payer: Self-pay | Admitting: Cardiovascular Disease

## 2023-08-29 ENCOUNTER — Ambulatory Visit: Payer: Medicare Other

## 2023-08-29 DIAGNOSIS — I495 Sick sinus syndrome: Secondary | ICD-10-CM

## 2023-08-30 LAB — CUP PACEART REMOTE DEVICE CHECK
Battery Remaining Longevity: 95 mo
Battery Remaining Percentage: 95.5 %
Battery Voltage: 2.99 V
Brady Statistic AP VP Percent: 30 %
Brady Statistic AP VS Percent: 62 %
Brady Statistic AS VP Percent: 1 %
Brady Statistic AS VS Percent: 7.9 %
Brady Statistic RA Percent Paced: 92 %
Brady Statistic RV Percent Paced: 30 %
Date Time Interrogation Session: 20241021020012
Implantable Lead Connection Status: 753985
Implantable Lead Connection Status: 753985
Implantable Lead Implant Date: 20130510
Implantable Lead Implant Date: 20130510
Implantable Lead Location: 753859
Implantable Lead Location: 753860
Implantable Pulse Generator Implant Date: 20240418
Lead Channel Impedance Value: 360 Ohm
Lead Channel Impedance Value: 410 Ohm
Lead Channel Pacing Threshold Amplitude: 0.75 V
Lead Channel Pacing Threshold Amplitude: 1 V
Lead Channel Pacing Threshold Pulse Width: 0.5 ms
Lead Channel Pacing Threshold Pulse Width: 0.5 ms
Lead Channel Sensing Intrinsic Amplitude: 5 mV
Lead Channel Sensing Intrinsic Amplitude: 9.9 mV
Lead Channel Setting Pacing Amplitude: 2.5 V
Lead Channel Setting Pacing Amplitude: 2.5 V
Lead Channel Setting Pacing Pulse Width: 0.5 ms
Lead Channel Setting Sensing Sensitivity: 2 mV
Pulse Gen Model: 2272
Pulse Gen Serial Number: 8168683

## 2023-09-15 NOTE — Progress Notes (Signed)
Remote pacemaker transmission.   

## 2023-11-28 ENCOUNTER — Ambulatory Visit (INDEPENDENT_AMBULATORY_CARE_PROVIDER_SITE_OTHER): Payer: Medicare Other

## 2023-11-28 DIAGNOSIS — I495 Sick sinus syndrome: Secondary | ICD-10-CM

## 2023-11-28 LAB — CUP PACEART REMOTE DEVICE CHECK
Battery Remaining Longevity: 93 mo
Battery Remaining Percentage: 95 %
Battery Voltage: 2.99 V
Brady Statistic AP VP Percent: 31 %
Brady Statistic AP VS Percent: 62 %
Brady Statistic AS VP Percent: 1 %
Brady Statistic AS VS Percent: 6.7 %
Brady Statistic RA Percent Paced: 93 %
Brady Statistic RV Percent Paced: 31 %
Date Time Interrogation Session: 20250120020014
Implantable Lead Connection Status: 753985
Implantable Lead Connection Status: 753985
Implantable Lead Implant Date: 20130510
Implantable Lead Implant Date: 20130510
Implantable Lead Location: 753859
Implantable Lead Location: 753860
Implantable Pulse Generator Implant Date: 20240418
Lead Channel Impedance Value: 380 Ohm
Lead Channel Impedance Value: 450 Ohm
Lead Channel Pacing Threshold Amplitude: 0.75 V
Lead Channel Pacing Threshold Amplitude: 1 V
Lead Channel Pacing Threshold Pulse Width: 0.5 ms
Lead Channel Pacing Threshold Pulse Width: 0.5 ms
Lead Channel Sensing Intrinsic Amplitude: 4.3 mV
Lead Channel Sensing Intrinsic Amplitude: 9.7 mV
Lead Channel Setting Pacing Amplitude: 2.5 V
Lead Channel Setting Pacing Amplitude: 2.5 V
Lead Channel Setting Pacing Pulse Width: 0.5 ms
Lead Channel Setting Sensing Sensitivity: 2 mV
Pulse Gen Model: 2272
Pulse Gen Serial Number: 8168683

## 2023-11-29 ENCOUNTER — Telehealth: Payer: Self-pay

## 2023-11-29 NOTE — Telephone Encounter (Signed)
Scheduled remote reviewed. Normal device function.   1 AMS lasting 10 seconds, EGM shows noise on RA and RV leads, lead trends stable. Sent to triage for review.  Next remote 91 days.   I do not see this on prior transmissions. Appears some oversensing of noise on atrial channel causing a mode switch.  Forwarding to Dr. Salena Saner:   (s/p gen change 02/25/2023) Would you like for [place] to bring patient in to check device and make sensing adjustments as needed?  Or continue to monitor?  Lead trends are stable.

## 2023-11-29 NOTE — Telephone Encounter (Signed)
I think just continue to monitor. Does not look too serious. He has 2088 leads so noise is not a huge surprise.

## 2024-01-05 NOTE — Progress Notes (Signed)
 Remote pacemaker transmission.

## 2024-02-27 ENCOUNTER — Ambulatory Visit (INDEPENDENT_AMBULATORY_CARE_PROVIDER_SITE_OTHER): Payer: Medicare Other

## 2024-02-27 DIAGNOSIS — I495 Sick sinus syndrome: Secondary | ICD-10-CM

## 2024-02-28 LAB — CUP PACEART REMOTE DEVICE CHECK
Battery Remaining Longevity: 89 mo
Battery Remaining Percentage: 92 %
Battery Voltage: 2.99 V
Brady Statistic AP VP Percent: 35 %
Brady Statistic AP VS Percent: 59 %
Brady Statistic AS VP Percent: 1 %
Brady Statistic AS VS Percent: 5.9 %
Brady Statistic RA Percent Paced: 94 %
Brady Statistic RV Percent Paced: 35 %
Date Time Interrogation Session: 20250421020019
Implantable Lead Connection Status: 753985
Implantable Lead Connection Status: 753985
Implantable Lead Implant Date: 20130510
Implantable Lead Implant Date: 20130510
Implantable Lead Location: 753859
Implantable Lead Location: 753860
Implantable Pulse Generator Implant Date: 20240418
Lead Channel Impedance Value: 360 Ohm
Lead Channel Impedance Value: 410 Ohm
Lead Channel Pacing Threshold Amplitude: 0.75 V
Lead Channel Pacing Threshold Amplitude: 1 V
Lead Channel Pacing Threshold Pulse Width: 0.5 ms
Lead Channel Pacing Threshold Pulse Width: 0.5 ms
Lead Channel Sensing Intrinsic Amplitude: 10 mV
Lead Channel Sensing Intrinsic Amplitude: 4.7 mV
Lead Channel Setting Pacing Amplitude: 2.5 V
Lead Channel Setting Pacing Amplitude: 2.5 V
Lead Channel Setting Pacing Pulse Width: 0.5 ms
Lead Channel Setting Sensing Sensitivity: 2 mV
Pulse Gen Model: 2272
Pulse Gen Serial Number: 8168683

## 2024-04-17 NOTE — Addendum Note (Signed)
 Addended by: Edra Govern D on: 04/17/2024 11:00 AM   Modules accepted: Orders

## 2024-04-17 NOTE — Progress Notes (Signed)
 Remote pacemaker transmission.

## 2024-05-28 ENCOUNTER — Ambulatory Visit: Payer: Medicare Other

## 2024-05-28 DIAGNOSIS — I495 Sick sinus syndrome: Secondary | ICD-10-CM | POA: Diagnosis not present

## 2024-05-29 LAB — CUP PACEART REMOTE DEVICE CHECK
Battery Remaining Longevity: 85 mo
Battery Remaining Percentage: 89 %
Battery Voltage: 2.98 V
Brady Statistic AP VP Percent: 41 %
Brady Statistic AP VS Percent: 54 %
Brady Statistic AS VP Percent: 1 %
Brady Statistic AS VS Percent: 5 %
Brady Statistic RA Percent Paced: 95 %
Brady Statistic RV Percent Paced: 41 %
Date Time Interrogation Session: 20250721023758
Implantable Lead Connection Status: 753985
Implantable Lead Connection Status: 753985
Implantable Lead Implant Date: 20130510
Implantable Lead Implant Date: 20130510
Implantable Lead Location: 753859
Implantable Lead Location: 753860
Implantable Pulse Generator Implant Date: 20240418
Lead Channel Impedance Value: 360 Ohm
Lead Channel Impedance Value: 410 Ohm
Lead Channel Pacing Threshold Amplitude: 0.75 V
Lead Channel Pacing Threshold Amplitude: 1 V
Lead Channel Pacing Threshold Pulse Width: 0.5 ms
Lead Channel Pacing Threshold Pulse Width: 0.5 ms
Lead Channel Sensing Intrinsic Amplitude: 5 mV
Lead Channel Sensing Intrinsic Amplitude: 9 mV
Lead Channel Setting Pacing Amplitude: 2.5 V
Lead Channel Setting Pacing Amplitude: 2.5 V
Lead Channel Setting Pacing Pulse Width: 0.5 ms
Lead Channel Setting Sensing Sensitivity: 2 mV
Pulse Gen Model: 2272
Pulse Gen Serial Number: 8168683

## 2024-05-30 ENCOUNTER — Ambulatory Visit: Payer: Self-pay | Admitting: Cardiovascular Disease

## 2024-08-13 NOTE — Progress Notes (Signed)
 Remote PPM Transmission

## 2024-08-27 ENCOUNTER — Ambulatory Visit (INDEPENDENT_AMBULATORY_CARE_PROVIDER_SITE_OTHER): Payer: Medicare Other

## 2024-08-27 DIAGNOSIS — I495 Sick sinus syndrome: Secondary | ICD-10-CM | POA: Diagnosis not present

## 2024-08-28 LAB — CUP PACEART REMOTE DEVICE CHECK
Battery Remaining Longevity: 80 mo
Battery Remaining Percentage: 86 %
Battery Voltage: 2.98 V
Brady Statistic AP VP Percent: 45 %
Brady Statistic AP VS Percent: 50 %
Brady Statistic AS VP Percent: 1 %
Brady Statistic AS VS Percent: 4.4 %
Brady Statistic RA Percent Paced: 95 %
Brady Statistic RV Percent Paced: 45 %
Date Time Interrogation Session: 20251020020014
Implantable Lead Connection Status: 753985
Implantable Lead Connection Status: 753985
Implantable Lead Implant Date: 20130510
Implantable Lead Implant Date: 20130510
Implantable Lead Location: 753859
Implantable Lead Location: 753860
Implantable Pulse Generator Implant Date: 20240418
Lead Channel Impedance Value: 360 Ohm
Lead Channel Impedance Value: 410 Ohm
Lead Channel Pacing Threshold Amplitude: 0.75 V
Lead Channel Pacing Threshold Amplitude: 1 V
Lead Channel Pacing Threshold Pulse Width: 0.5 ms
Lead Channel Pacing Threshold Pulse Width: 0.5 ms
Lead Channel Sensing Intrinsic Amplitude: 11.3 mV
Lead Channel Sensing Intrinsic Amplitude: 4.7 mV
Lead Channel Setting Pacing Amplitude: 2.5 V
Lead Channel Setting Pacing Amplitude: 2.5 V
Lead Channel Setting Pacing Pulse Width: 0.5 ms
Lead Channel Setting Sensing Sensitivity: 2 mV
Pulse Gen Model: 2272
Pulse Gen Serial Number: 8168683

## 2024-08-29 ENCOUNTER — Ambulatory Visit: Payer: Self-pay | Admitting: Cardiovascular Disease

## 2024-08-31 NOTE — Progress Notes (Signed)
 Remote PPM Transmission

## 2024-11-26 ENCOUNTER — Ambulatory Visit: Payer: Medicare Other

## 2024-11-26 DIAGNOSIS — I495 Sick sinus syndrome: Secondary | ICD-10-CM

## 2024-11-27 LAB — CUP PACEART REMOTE DEVICE CHECK
Battery Remaining Longevity: 78 mo
Battery Remaining Percentage: 83 %
Battery Voltage: 2.99 V
Brady Statistic AP VP Percent: 47 %
Brady Statistic AP VS Percent: 49 %
Brady Statistic AS VP Percent: 1 %
Brady Statistic AS VS Percent: 4.3 %
Brady Statistic RA Percent Paced: 95 %
Brady Statistic RV Percent Paced: 47 %
Date Time Interrogation Session: 20260119190832
Implantable Lead Connection Status: 753985
Implantable Lead Connection Status: 753985
Implantable Lead Implant Date: 20130510
Implantable Lead Implant Date: 20130510
Implantable Lead Location: 753859
Implantable Lead Location: 753860
Implantable Pulse Generator Implant Date: 20240418
Lead Channel Impedance Value: 380 Ohm
Lead Channel Impedance Value: 430 Ohm
Lead Channel Pacing Threshold Amplitude: 0.75 V
Lead Channel Pacing Threshold Amplitude: 1 V
Lead Channel Pacing Threshold Pulse Width: 0.5 ms
Lead Channel Pacing Threshold Pulse Width: 0.5 ms
Lead Channel Sensing Intrinsic Amplitude: 4.3 mV
Lead Channel Sensing Intrinsic Amplitude: 7.8 mV
Lead Channel Setting Pacing Amplitude: 2.5 V
Lead Channel Setting Pacing Amplitude: 2.5 V
Lead Channel Setting Pacing Pulse Width: 0.5 ms
Lead Channel Setting Sensing Sensitivity: 2 mV
Pulse Gen Model: 2272
Pulse Gen Serial Number: 8168683

## 2024-11-28 ENCOUNTER — Ambulatory Visit: Payer: Self-pay | Admitting: Cardiovascular Disease

## 2024-11-30 NOTE — Progress Notes (Signed)
 Remote PPM Transmission
